# Patient Record
Sex: Male | Born: 1955
Health system: Southern US, Community
[De-identification: ages and names within clinical notes are randomized; demographics above are authoritative.]

## PROBLEM LIST (undated history)

## (undated) DIAGNOSIS — C801 Malignant (primary) neoplasm, unspecified: Secondary | ICD-10-CM

## (undated) DIAGNOSIS — Z85828 Personal history of other malignant neoplasm of skin: Secondary | ICD-10-CM

## (undated) DIAGNOSIS — I10 Essential (primary) hypertension: Secondary | ICD-10-CM

## (undated) DIAGNOSIS — M1990 Unspecified osteoarthritis, unspecified site: Secondary | ICD-10-CM

## (undated) DIAGNOSIS — H269 Unspecified cataract: Secondary | ICD-10-CM

## (undated) DIAGNOSIS — R011 Cardiac murmur, unspecified: Secondary | ICD-10-CM

## (undated) DIAGNOSIS — F419 Anxiety disorder, unspecified: Secondary | ICD-10-CM

## (undated) HISTORY — DX: Unspecified cataract: H26.9

## (undated) HISTORY — PX: KNEE ARTHROSCOPY: SHX127

## (undated) HISTORY — PX: JOINT REPLACEMENT: SHX530

## (undated) HISTORY — PX: SPINE SURGERY: SHX786

---

## 2005-07-04 ENCOUNTER — Ambulatory Visit: Payer: Self-pay | Admitting: Gastroenterology

## 2008-11-12 HISTORY — PX: BACK SURGERY: SHX140

## 2008-11-12 HISTORY — PX: KNEE ARTHROPLASTY: SHX992

## 2008-11-18 ENCOUNTER — Inpatient Hospital Stay (HOSPITAL_COMMUNITY): Admission: RE | Admit: 2008-11-18 | Discharge: 2008-11-20 | Payer: Self-pay | Admitting: Orthopedic Surgery

## 2008-11-18 HISTORY — PX: REPLACEMENT TOTAL KNEE: SUR1224

## 2009-10-25 ENCOUNTER — Inpatient Hospital Stay (HOSPITAL_COMMUNITY): Admission: RE | Admit: 2009-10-25 | Discharge: 2009-10-28 | Payer: Self-pay | Admitting: Neurosurgery

## 2011-02-13 LAB — CBC
HCT: 46 % (ref 39.0–52.0)
Hemoglobin: 16 g/dL (ref 13.0–17.0)
MCHC: 34.8 g/dL (ref 30.0–36.0)
MCV: 94.2 fL (ref 78.0–100.0)
Platelets: 207 10*3/uL (ref 150–400)
RBC: 4.88 MIL/uL (ref 4.22–5.81)
RDW: 13.5 % (ref 11.5–15.5)
WBC: 4.4 10*3/uL (ref 4.0–10.5)

## 2011-02-13 LAB — COMPREHENSIVE METABOLIC PANEL
ALT: 24 U/L (ref 0–53)
AST: 15 U/L (ref 0–37)
Albumin: 4.2 g/dL (ref 3.5–5.2)
Alkaline Phosphatase: 64 U/L (ref 39–117)
BUN: 22 mg/dL (ref 6–23)
CO2: 25 mEq/L (ref 19–32)
Calcium: 9.4 mg/dL (ref 8.4–10.5)
Chloride: 104 mEq/L (ref 96–112)
Creatinine, Ser: 1.13 mg/dL (ref 0.4–1.5)
GFR calc non Af Amer: >60 mL/min (ref >60)
Glucose, Bld: 103 mg/dL — ABNORMAL HIGH (ref 70–99)
Potassium: 4.6 mEq/L (ref 3.5–5.1)
Sodium: 138 mEq/L (ref 135–145)
Total Bilirubin: 0.8 mg/dL (ref 0.3–1.2)
Total Protein: 6.3 g/dL (ref 6.0–8.3)

## 2011-02-13 LAB — ABO/RH: ABO/RH(D): O POS

## 2011-02-13 LAB — DIFFERENTIAL
Basophils Absolute: 0 10*3/uL (ref 0.0–0.1)
Basophils Relative: 1 % (ref 0–1)
Eosinophils Absolute: 0.1 10*3/uL (ref 0.0–0.7)
Eosinophils Relative: 2 % (ref 0–5)
Lymphocytes Relative: 31 % (ref 12–46)
Lymphs Abs: 1.4 10*3/uL (ref 0.7–4.0)
Monocytes Absolute: 0.4 10*3/uL (ref 0.1–1.0)
Monocytes Relative: 10 % (ref 3–12)
Neutro Abs: 2.5 10*3/uL (ref 1.7–7.7)
Neutrophils Relative %: 56 % (ref 43–77)

## 2011-02-13 LAB — URINALYSIS, ROUTINE W REFLEX MICROSCOPIC
Bilirubin Urine: NEGATIVE
Glucose, UA: NEGATIVE mg/dL
Hgb urine dipstick: NEGATIVE
Ketones, ur: NEGATIVE mg/dL
Nitrite: NEGATIVE
Protein, ur: NEGATIVE mg/dL
Specific Gravity, Urine: 1.027 (ref 1.005–1.030)
Urobilinogen, UA: 0.2 mg/dL (ref 0.0–1.0)
pH: 5.5 (ref 5.0–8.0)

## 2011-02-13 LAB — TYPE AND SCREEN
ABO/RH(D): O POS
Antibody Screen: NEGATIVE

## 2011-02-13 LAB — APTT: aPTT: 32 seconds (ref 24–37)

## 2011-02-13 LAB — PROTIME-INR
INR: 0.99 (ref 0.00–1.49)
Prothrombin Time: 13 seconds (ref 11.6–15.2)

## 2011-02-26 LAB — CBC
HCT: 48.5 % (ref 39.0–52.0)
Hemoglobin: 16.6 g/dL (ref 13.0–17.0)
MCHC: 34.2 g/dL (ref 30.0–36.0)
MCV: 95.5 fL (ref 78.0–100.0)
Platelets: 216 10*3/uL (ref 150–400)
RBC: 5.08 MIL/uL (ref 4.22–5.81)
RDW: 13.2 % (ref 11.5–15.5)
WBC: 4.3 10*3/uL (ref 4.0–10.5)

## 2011-02-26 LAB — DIFFERENTIAL
Basophils Absolute: 0 10*3/uL (ref 0.0–0.1)
Basophils Relative: 1 % (ref 0–1)
Eosinophils Absolute: 0.1 10*3/uL (ref 0.0–0.7)
Eosinophils Relative: 3 % (ref 0–5)
Lymphocytes Relative: 32 % (ref 12–46)
Lymphs Abs: 1.4 10*3/uL (ref 0.7–4.0)
Monocytes Absolute: 0.5 10*3/uL (ref 0.1–1.0)
Monocytes Relative: 11 % (ref 3–12)
Neutro Abs: 2.4 10*3/uL (ref 1.7–7.7)
Neutrophils Relative %: 55 % (ref 43–77)

## 2011-02-26 LAB — HEMOGLOBIN AND HEMATOCRIT, BLOOD
HCT: 32.8 % — ABNORMAL LOW (ref 39.0–52.0)
HCT: 33.2 % — ABNORMAL LOW (ref 39.0–52.0)
HCT: 38.7 % — ABNORMAL LOW (ref 39.0–52.0)
Hemoglobin: 11.4 g/dL — ABNORMAL LOW (ref 13.0–17.0)
Hemoglobin: 11.6 g/dL — ABNORMAL LOW (ref 13.0–17.0)
Hemoglobin: 13.4 g/dL (ref 13.0–17.0)

## 2011-02-26 LAB — URINALYSIS, ROUTINE W REFLEX MICROSCOPIC
Bilirubin Urine: NEGATIVE
Glucose, UA: NEGATIVE mg/dL
Hgb urine dipstick: NEGATIVE
Ketones, ur: NEGATIVE mg/dL
Nitrite: NEGATIVE
Protein, ur: NEGATIVE mg/dL
Specific Gravity, Urine: 1.021 (ref 1.005–1.030)
Urobilinogen, UA: 0.2 mg/dL (ref 0.0–1.0)
pH: 5.5 (ref 5.0–8.0)

## 2011-02-26 LAB — TYPE AND SCREEN
ABO/RH(D): O POS
Antibody Screen: NEGATIVE

## 2011-02-26 LAB — PROTIME-INR
INR: 1 (ref 0.00–1.49)
INR: 1.3 (ref 0.00–1.49)
INR: 2.6 — ABNORMAL HIGH (ref 0.00–1.49)
Prothrombin Time: 12.9 seconds (ref 11.6–15.2)
Prothrombin Time: 16.1 seconds — ABNORMAL HIGH (ref 11.6–15.2)
Prothrombin Time: 29.3 seconds — ABNORMAL HIGH (ref 11.6–15.2)

## 2011-02-26 LAB — COMPREHENSIVE METABOLIC PANEL
ALT: 23 U/L (ref 0–53)
AST: 19 U/L (ref 0–37)
Albumin: 4.3 g/dL (ref 3.5–5.2)
Alkaline Phosphatase: 63 U/L (ref 39–117)
BUN: 23 mg/dL (ref 6–23)
CO2: 23 mEq/L (ref 19–32)
Calcium: 9.4 mg/dL (ref 8.4–10.5)
Chloride: 107 mEq/L (ref 96–112)
Creatinine, Ser: 1.01 mg/dL (ref 0.4–1.5)
GFR calc non Af Amer: >60 mL/min (ref >60)
Glucose, Bld: 105 mg/dL — ABNORMAL HIGH (ref 70–99)
Potassium: 4.1 mEq/L (ref 3.5–5.1)
Sodium: 136 mEq/L (ref 135–145)
Total Bilirubin: 0.6 mg/dL (ref 0.3–1.2)
Total Protein: 7 g/dL (ref 6.0–8.3)

## 2011-02-26 LAB — URINE CULTURE
Colony Count: NO GROWTH
Culture: NO GROWTH
Special Requests: NEGATIVE

## 2011-02-26 LAB — ABO/RH: ABO/RH(D): O POS

## 2011-02-26 LAB — APTT: aPTT: 32 seconds (ref 24–37)

## 2011-03-27 NOTE — H&P (Signed)
Matthew [name], Matthew [name]             ACCOUNT NO.:  [id number]   MEDICAL RECORD NO.:  [id number]          PATIENT TYPE:  INP   LOCATION:                               FACILITY:  [facility]   PHYSICIAN:  Georges Lynch. Gioffre, M.D.DATE OF BIRTH:  24-Apr-1956   DATE OF ADMISSION:  11/18/2008  DATE OF DISCHARGE:                              HISTORY & PHYSICAL   DATE OF ADMISSION:  November 18, 2008.   CHIEF COMPLAINT:  Bilateral knee pain left greater than right.   PRESENT ILLNESS:  The patient is a 55 year old gentleman with  longstanding problems with bilateral knees.  He has had his left knee  arthroscoped in the past by Dr. Fannie Knee.  Patient has significant pain with  range of motion and ambulation.  He has lost range of motion.  X-ray  shows he has severe tricompartment arthritic changes of his left knee  with advanced arthritic changes of his right knee.  The patient has  elected to proceed with a left total knee arthroplasty.   CURRENT MEDICATIONS:  Lisinopril once a day.   ALLERGIES:  No known drug allergies.   PRIMARY CARE PHYSICIAN:  Erskine Speed, M.D..   PAST MEDICAL HISTORY:  Includes:  1. Hypertension.  2,  Cardiac murmur.  1. History of hemorrhoids.   REVIEW OF SYSTEMS:  Is negative for any neurologic, pulmonary.  His  blood pressure is fairly well-controlled.  He has never had a heart  attack.  No chest pains, no shortness of breath, irregular heart  rhythms.  GI:  He has had hemorrhoids in the past.  No problems  recently.  No problems with ulcers, gallbladder or liver.  GU:  Is  unremarkable.  ENDOCRINE:  Is unremarkable.   PAST SURGICAL HISTORY:  Includes left knee arthroscopic surgery in 1986  and 1990 without any complications.   FAMILY MEDICAL HISTORY:  Father is deceased from prostate cancer.  Mother is in good health.   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  He does have occasional alcoholic beverage.  He does not have  any  history of street drug use.  He has three grown children.  He lives  alone in a two-level single family home.   PHYSICAL EXAM:  VITALS:  Height is 5 feet 7 inches, weight is 240, blood  pressure is 148/86, pulse of 80 and regular, respirations 12, patient is  afebrile.  GENERAL:  This is a healthy-appearing, slightly centrally obese  gentleman conscious, alert and appropriate who appears to be a good  historian.  He ambulates with a fairly easy balanced gait.  HEENT:  Head was normocephalic.  Pupils equal, round and reactive.  Gross hearing is intact.  NECK:  Was supple.  No palpable lymphadenopathy.  Good range of motion.  CHEST:  Lung sounds were clear and equal bilaterally.  No wheezes,  rales, rhonchi.  HEART:  Regular rate and rhythm.  No murmurs, rubs or gallops.  ABDOMEN:  Was round, soft.  Bowel sounds present.  EXTREMITIES:  Upper extremities had good range of motion of shoulders,  elbows  and wrists.  Motor strength is 5/5.  LOWER EXTREMITIES:  He had good range of motion of both hips today  without any discomfort.  Both knees lacked about 5 degrees of extension.  He can flex both knees back to 120 degrees.  He did have crepitus under  both patellas.  He had no effusions.  He had no instability.  The calves  were soft, good motion at the ankles.  PERIPHERAL VASCULAR:  Carotid pulses were 2+, no bruits.  Radial pulses  were 2+.  Posterior tibial pulses were 1+.  He had no lower extremity  edema.  NEURO:  The patient was conscious, alert and appropriate.  He had no  gross neurologic defects noted.  BREAST, RECTAL AND GU EXAMS:  Were deferred at this time.   IMPRESSION:  1. End-stage tricompartmental arthritic changes left knee.  2. Right knee with advanced arthritic changes.  3. Hypertension.  4. History of cardiac murmur.  5. History of hemorrhoids.   PLAN:  The patient will undergo all routine laboratories and tests prior  to having a left total knee arthroplasty by Dr.  Darrelyn Hillock at [facility] on November 18, 2008.      Jamelle Rushing, P.A.    ______________________________  Georges Lynch Darrelyn Hillock, M.D.    RWK/MEDQ  D:  11/17/2008  T:  11/17/2008  Job:  [id number]   cc:   Windy Fast A. Darrelyn Hillock, M.D.  Fax: [phone]

## 2011-03-27 NOTE — Op Note (Signed)
Matthew [name], Matthew [name]             ACCOUNT NO.:  [id number]   MEDICAL RECORD NO.:  [id number]          PATIENT TYPE:  INP   LOCATION:  NA                           FACILITY:  [facility]   PHYSICIAN:  Georges Lynch. Gioffre, M.D.DATE OF BIRTH:  1956-08-22   DATE OF PROCEDURE:  11/18/2008  DATE OF DISCHARGE:                               OPERATIVE REPORT   SURGEON:  Dr. Darrelyn Hillock.   OPERATIONS:  Arlyn Leak, P.A.   PREOPERATIVE DIAGNOSES:  Severe degenerative arthritis with a flexion  contraction of the left knee.  Note, he was unable to extend his knee  approximately 15-20 degrees preoperatively.   POSTOPERATIVE DIAGNOSIS:  Severe degenerative arthritis with a flexion  contraction of the left knee.  Note, he was unable to extend his knee  approximately 15-20 degrees preoperatively.   OPERATION:  Left total knee arthroplasty utilizing the DePuy system.  I  cemented all 3 components in simultaneously, and vancomycin was used in  the cement.  The sizes used:  The femoral component was a size 5 left  posterior cruciate stabilizing.  The tibial insert was a size 5 10-mm  thickness rotating platform.  The tibial tray was a size 5.  The patella  was a size 41 with 3 pegs.  Note all 3 components were cemented, and  vancomycin was used in the cement.   PROCEDURE:  Under spinal anesthesia, routine orthopedic prepping and  draping of the left lower extremity was carried out.  He had 2 grams of  IV Ancef.  At this time, the leg was exsanguinated with an Esmarch.  Tourniquet was elevated at 375 mmHg.  The knee was flexed.  An anterior  incision was made.  Two flaps were created over the left knee.  I then  carried out a median parapatellar incision.  Note, the knee was  extremely tight because of the marked contracture.  I then flexed the  knee and did medial and lateral meniscectomy, and I excised the anterior  posterior cruciate ligaments.  At this time, initial drill holes were  made in the  intercondylar notch.  Following that, the intramedullary  guide was used.  We initially removed 11-mm thickness off of distal  femur.  We had to go back later and remove another 4-mm thickness off  because a severe contracture of the knee.  We measured in the femur to  be a size 5 left.  We then inserted our next jig and carried out our  anterior, posterior chamfer cuts.  Following that, we prepared the tibia  in the usual fashion.  Drill holes were made in the tibial plateau.  We  then inserted our intramedullary guide, and we removed 4-mm thickness  off the affected medial side of the tibia.  Following that, we cut our  keel cut out of the proximal tibia plateau.  Following that, we then  went ahead and cut our notch cut out of the distal femur.  We thoroughly  water picked out the knee, and we did use the spacer blocks for our  measurements for ligamentous tensions.  We also exposed the  posterior  condyles and removed the spurs off the posterior condyles with final  procedure.  Following that, we went ahead and inserted our temporary  prosthesis.  We then did a resurfacing procedure on the patella in the  usual fashion for a 41-mm patella.  Three drill holes were made in the  patella.  Following that, we removed all trial components, thoroughly  water picked out the knee, cemented all 3 components in simultaneously.  At this particular time, we then after the cement was hardened removed  all loose pieces of cement.  We removed the trial tibial insert,  thoroughly water picked out the knee, looked for cement again.  We  removed all loose pieces of cement.  Following that, we injected 30 mL  of 0.25% Marcaine with epinephrine and 30 mg of Toradol into the soft  tissue structures.  I then water picked the knee out again, dried the  knee out, and inserted my permanent rotating platform, size 5 10-mm  thickness.  At this time, the knee was reduced.  We had  excellent function and good  extension, good flexion, good lateral medial  stability.  Note, the patella was reexamined to make sure there were no  loose pieces of cement in the patella, and there were not.  We then  inserted our Hemovac drain and closed the knee in layers in usual  fashion.  Sterile Neosporin dressings were applied.           ______________________________  Georges Lynch Darrelyn Hillock, M.D.     RAG/MEDQ  D:  11/18/2008  T:  11/18/2008  Job:  [id number]   cc:   Windy Fast A. Darrelyn Hillock, M.D.  Fax: [phone]

## 2011-03-30 NOTE — Discharge Summary (Signed)
[name], [name]             ACCOUNT NO.:  [id number]   MEDICAL RECORD NO.:  [id number]          PATIENT TYPE:  INP   LOCATION:  1608                         [facility]ACILITY:  [facility]   PHYSICIAN:  Georges Lynch. Gioffre, M.D.DATE OF BIRTH:  June 04, 1956   DATE OF ADMISSION:  11/18/2008  DATE OF DISCHARGE:  11/20/2008                               DISCHARGE SUMMARY   ADMISSION DIAGNOSES:  1. Bilateral knee severe arthritis left more symptomatic than right.  2. Hypertension.  3. Cardiac murmur.  4. History of hemorrhoids.   DISCHARGE DIAGNOSES:  1. Left total knee arthroplasty.  2. Severe osteoarthritis right knee.  3. Hypertension.  4. Cardiac murmur.  5. History of hemorrhoids.   HISTORY OF PRESENT ILLNESS:  The patient is a 55 year old gentleman with  longstanding problem of bilateral knees.  The left knee was scoped  several times in the past.  He has severe pain with range of motion,  weightbearing and significant effect on his activities of daily living.  X-ray shows he has severe tricompartmental changes of left knee with  advanced changes of right knee.  The patient has elected to proceed with  a left total knee arthroplasty.   ALLERGIES:  No known drug allergies.   CURRENT MEDICATIONS:  Lisinopril once a day.   SURGICAL PROCEDURE:  On November 18, 2008, the patient was taken to the OR  by Dr. Worthy Rancher and assisted by Oneida Alar PA-C and under general  anesthesia the patient underwent a left total knee arthroplasty with a  DePuy rotating platform system.  The patient tolerated the procedure  well.  There was minimal blood loss.  No complications.  The patient had  the following components implanted.  A size 5 femoral component left  side, a size 5 keel to left tibial tray, a size 41 mm three peg patella,  a size 5 and 10 mL polyethylene bearing.  All bones were implanted with  methylmethacrylate with vancomycin mixed in.   CONSULTS:  Following routine consult requested  physical therapy, case  management, pharmacy for Coumadin monitoring.   HOSPITAL COURSE:  On November 18, 2008, the patient was admitted to [facility] under the care of Dr. Worthy Rancher.  The patient was taken  to the OR where a left total knee arthroplasty was performed without any  complications.  The patient was transferred to the recovery room and  then to the orthopedic floor in good condition with IV antibiotics, pain  medicines and Coumadin for DVT prophylaxis.  He was to follow a total  knee protocol.  The patient was able to wean off his IV pain medicines  well without any difficulties.  The patient just had some continued  drainage from his Hemovac port so his IV antibiotics were continued for  an extra 24 hours.  The patient's pain was well-controlled with p.o.  management.  The patient's vital signs remained stable with activities.  The patient tolerated the physical therapy well.  He was able to  ambulate in excess of 200 feet.  The patient's wound remained benign for  any signs of infection.  His leg remained neuromotor vascularly intact.  The patient progressed nicely with therapy.  On postop day 2 the patient  was eager to go home.  Arrangements were made and he was discharged in  good condition for outpatient home health with physical therapy  arrangements.   LABS:  CBC on admission found WBCs 4.3, hemoglobin 16.6, hematocrit  48.5, platelets 216.  On discharge his H and H was 11.4 with a  hematocrit of 32.8.  His INR was 2.6.  Chemistries were within normal  limits.  Urinalysis on admission was normal.  EKG on admission was  normal sinus rhythm at 78 beats.  Postop the left knee film shows left  knee arthroplasty without apparent complications.   DISCHARGE INSTRUCTIONS:  Diet, no restrictions.  Activity, may increase  activities slowly with use of a walker.  Wound care, he is to change his  dressing daily.   FOLLOWUP:  1. He is to have a followup  appointment with Dr. Darrelyn Hillock 2 weeks from      discharge.  He is to call [phone] for that followup appointment.  2. Home health arrangements for physical therapy and Coumadin      monitoring.   MEDICATIONS:  1. Percocet 10/650 one every 4-6 hours for pain if needed.  2. Coumadin.  He is to hold it on Saturday, January 9 and then restart      it on Saturday and follow instructions through home health physical      therapy's agency pharmacist.  3. Robaxin 500 mg once every 6 hours if needed for spasms.  4. Lisinopril 20 mg once a day.  5. Aleve.  He is to stop while he is on Coumadin.   DISCHARGE CONDITION:  The patient's condition upon discharge home is  improved and good.      Jamelle Rushing, P.A.    ______________________________  Georges Lynch Darrelyn Hillock, M.D.    RWK/MEDQ  D:  12/08/2008  T:  12/08/2008  Job:  [id number]

## 2011-09-26 ENCOUNTER — Encounter (HOSPITAL_COMMUNITY): Payer: Self-pay | Admitting: Pharmacy Technician

## 2011-09-27 ENCOUNTER — Encounter (HOSPITAL_COMMUNITY): Payer: Self-pay

## 2011-09-27 ENCOUNTER — Other Ambulatory Visit: Payer: Self-pay

## 2011-09-27 ENCOUNTER — Ambulatory Visit (HOSPITAL_COMMUNITY)
Admission: RE | Admit: 2011-09-27 | Discharge: 2011-09-27 | Disposition: A | Discharge status: Home / Self Care | Payer: BC Managed Care – PPO | Source: Ambulatory Visit | Attending: Orthopedic Surgery | Admitting: Orthopedic Surgery

## 2011-09-27 ENCOUNTER — Encounter (HOSPITAL_COMMUNITY)
Admission: RE | Admit: 2011-09-27 | Discharge: 2011-09-27 | Disposition: A | Discharge status: Home / Self Care | Payer: BC Managed Care – PPO | Source: Ambulatory Visit | Attending: Orthopedic Surgery | Admitting: Orthopedic Surgery

## 2011-09-27 ENCOUNTER — Other Ambulatory Visit: Payer: Self-pay | Admitting: Orthopedic Surgery

## 2011-09-27 DIAGNOSIS — Z01812 Encounter for preprocedural laboratory examination: Secondary | ICD-10-CM | POA: Insufficient documentation

## 2011-09-27 DIAGNOSIS — Z01818 Encounter for other preprocedural examination: Secondary | ICD-10-CM | POA: Insufficient documentation

## 2011-09-27 DIAGNOSIS — Z01810 Encounter for preprocedural cardiovascular examination: Secondary | ICD-10-CM | POA: Insufficient documentation

## 2011-09-27 HISTORY — DX: Unspecified osteoarthritis, unspecified site: M19.90

## 2011-09-27 HISTORY — DX: Essential (primary) hypertension: I10

## 2011-09-27 LAB — CBC
HCT: 48.3 % (ref 39.0–52.0)
Hemoglobin: 16.9 g/dL (ref 13.0–17.0)
MCH: 32.8 pg (ref 26.0–34.0)
MCHC: 35 g/dL (ref 30.0–36.0)
MCV: 93.6 fL (ref 78.0–100.0)
Platelets: 205 10*3/uL (ref 150–400)
RBC: 5.16 MIL/uL (ref 4.22–5.81)
RDW: 12.8 % (ref 11.5–15.5)
WBC: 4.5 10*3/uL (ref 4.0–10.5)

## 2011-09-27 LAB — SURGICAL PCR SCREEN
MRSA, PCR: NEGATIVE
Staphylococcus aureus: NEGATIVE

## 2011-09-27 LAB — URINALYSIS, ROUTINE W REFLEX MICROSCOPIC
Bilirubin Urine: NEGATIVE
Glucose, UA: NEGATIVE mg/dL
Hgb urine dipstick: NEGATIVE
Ketones, ur: NEGATIVE mg/dL
Leukocytes, UA: NEGATIVE
Nitrite: NEGATIVE
Protein, ur: NEGATIVE mg/dL
Specific Gravity, Urine: 1.021 (ref 1.005–1.030)
Urobilinogen, UA: 1 mg/dL (ref 0.0–1.0)
pH: 7 (ref 5.0–8.0)

## 2011-09-27 LAB — TYPE AND SCREEN
ABO/RH(D): O POS
Antibody Screen: NEGATIVE

## 2011-09-27 LAB — DIFFERENTIAL
Basophils Absolute: 0 10*3/uL (ref 0.0–0.1)
Basophils Relative: 1 % (ref 0–1)
Eosinophils Absolute: 0.1 10*3/uL (ref 0.0–0.7)
Eosinophils Relative: 2 % (ref 0–5)
Lymphocytes Relative: 36 % (ref 12–46)
Lymphs Abs: 1.6 10*3/uL (ref 0.7–4.0)
Monocytes Absolute: 0.5 10*3/uL (ref 0.1–1.0)
Monocytes Relative: 12 % (ref 3–12)
Neutro Abs: 2.3 10*3/uL (ref 1.7–7.7)
Neutrophils Relative %: 50 % (ref 43–77)

## 2011-09-27 LAB — COMPREHENSIVE METABOLIC PANEL
ALT: 15 U/L (ref 0–53)
AST: 16 U/L (ref 0–37)
Albumin: 4.6 g/dL (ref 3.5–5.2)
Alkaline Phosphatase: 64 U/L (ref 39–117)
BUN: 20 mg/dL (ref 6–23)
CO2: 25 mEq/L (ref 19–32)
Calcium: 10.1 mg/dL (ref 8.4–10.5)
Chloride: 101 mEq/L (ref 96–112)
Creatinine, Ser: 0.85 mg/dL (ref 0.50–1.35)
GFR calc Af Amer: >90 mL/min (ref >90)
GFR calc non Af Amer: >90 mL/min (ref >90)
Glucose, Bld: 97 mg/dL (ref 70–99)
Potassium: 4.4 mEq/L (ref 3.5–5.1)
Sodium: 137 mEq/L (ref 135–145)
Total Bilirubin: 0.4 mg/dL (ref 0.3–1.2)
Total Protein: 7.6 g/dL (ref 6.0–8.3)

## 2011-09-27 LAB — APTT: aPTT: 34 seconds (ref 24–37)

## 2011-09-27 LAB — PROTIME-INR
INR: 1.06 (ref 0.00–1.49)
Prothrombin Time: 14 seconds (ref 11.6–15.2)

## 2011-09-27 MED ORDER — CHLORHEXIDINE GLUCONATE 4 % EX LIQD: 60.0000 mL | Freq: Once | CUTANEOUS | Status: DC

## 2011-09-27 NOTE — Pre-Procedure Instructions (Signed)
[address] Demasi Kinsel  09/27/2011   Your procedure is scheduled on: NOV 26 Report to [facility] Short Stay [facility] at 0530 AM.  Call this number if you have problems the morning of surgery: [phone]   Remember:   Do not eat food:After Midnight.  Do not drink clear liquids: 4 Hours before arrival.  Take these medicines the morning of surgery with A SIP OF WATER: NONE   Do not wear jewelry, make-up or nail polish.  Do not wear lotions, powders, or perfumes. You may wear deodorant.  Do not shave 48 hours prior to surgery.  Do not bring valuables to the hospital.  Contacts, dentures or bridgework may not be worn into surgery.  Leave suitcase in the car. After surgery it may be brought to your room.  For patients admitted to the hospital, checkout time is 11:00 AM the day of discharge.   Patients discharged the day of surgery will not be allowed to drive home.  Name and phone number of your driver: FAMILY Special Instructions: Incentive Spirometry - Practice and bring it with you on the day of surgery. and CHG Shower Use Special Wash: 1/2 bottle night before surgery and 1/2 bottle morning of surgery.   Please read over the following fact sheets that you were given: Pain Booklet, Blood Transfusion Information, Total Joint Packet, MRSA Information and Surgical Site Infection Prevention

## 2011-09-28 LAB — URINE CULTURE
Colony Count: NO GROWTH
Culture  Setup Time: 201211151214
Culture: NO GROWTH

## 2011-10-01 ENCOUNTER — Other Ambulatory Visit: Payer: Self-pay | Admitting: Orthopedic Surgery

## 2011-10-01 NOTE — H&P (Signed)
  Matthew [name] MRN:  [id number] DOB/SEX:  July 16, 1956/male  CHIEF COMPLAINT:  Painful right Knee  HISTORY: Patient is a 55 y.o. male presented with a history of pain in the right knee. Onset of symptoms was gradual starting several years ago with gradually worsening course since that time. The patient noted no past surgery on the right knee. Prior procedures on the knee include arthroscopy. Patient has been treated conservatively with over-the-counter NSAIDs and activity modification. Patient currently rates pain in the knee at 9 out of 10 with activity. There is pain at night.  PAST MEDICAL HISTORY: There are no active problems to display for this patient.  Past Medical History  Diagnosis Date  . Hypertension     FOLLOWED BY DR ED GREEN  . Arthritis     KNEES BACK SHOULDERS   Past Surgical History  Procedure Date  . Knee arthroscopy     L X 2  . Knee arthroplasty 2010    L  . Back surgery 2010    L5S1 FUSION     MEDICATIONS:   (Not in a hospital admission)  ALLERGIES:  No Known Allergies  REVIEW OF SYSTEMS:  Pertinent items are noted in HPI.   FAMILY HISTORY:  No family history on file.  SOCIAL HISTORY:   History  Substance Use Topics  . Smoking status: Never Smoker   . Smokeless tobacco: Not on file  . Alcohol Use: 0.6 oz/week    1 Cans of beer per week     EXAMINATION:  Vital signs in last 24 hours: @VSRANGES @  General appearance: alert, cooperative and no distress Head: Normocephalic, without obvious abnormality, atraumatic Neck: no adenopathy, no carotid bruit, no JVD, supple, symmetrical, trachea midline and thyroid not enlarged, symmetric, no tenderness/mass/nodules Lungs: clear to auscultation bilaterally Heart: regular rate and rhythm, S1, S2 normal, no murmur, click, rub or gallop Extremities: extremities normal, atraumatic, no cyanosis or edema and Homans sign is negative, no sign of DVT Pulses: 2+ and symmetric Skin: Skin color, texture,  turgor normal. No rashes or lesions  Musculoskeletal:  ROM 0-90, Ligaments intact, positive crepitus Imaging Review Plain radiographs demonstrate severe degenerative joint disease of the right knee. The overall alignment is mild varus. The bone quality appears to be good for age and reported activity level.  Assessment/Plan: End stage arthritis, right knee   The patient history, physical examination and imaging studies are consistent with advanced degenerative joint disease of the right knee. The patient has failed conservative treatment.  The clearance notes were reviewed.  After discussion with the patient it was felt that Total Knee Replacement was indicated. The procedure,  risks, and benefits of total knee arthroplasty were presented and reviewed. The risks including but not limited to aseptic loosening, infection, blood clots, vascular injury, stiffness, patella tracking problems complications among others were discussed. The patient acknowledged the explanation, agreed to proceed with the plan.  Matthew [name] 10/01/2011, 11:48 AM

## 2011-10-07 MED ORDER — SODIUM CHLORIDE 0.9 % IV SOLN: INTRAVENOUS | Status: DC

## 2011-10-07 MED ORDER — CEFAZOLIN SODIUM-DEXTROSE 2-3 GM-% IV SOLR
2.0000 g | INTRAVENOUS | Status: AC
  Administered 2011-10-08: 2 g via INTRAVENOUS
  Filled 2011-10-07: qty 50

## 2011-10-07 MED ORDER — ACETAMINOPHEN 10 MG/ML IV SOLN: 1000.0000 mg | Freq: Once | INTRAVENOUS | Status: DC

## 2011-10-08 ENCOUNTER — Encounter (HOSPITAL_COMMUNITY): Payer: Self-pay

## 2011-10-08 ENCOUNTER — Encounter (HOSPITAL_COMMUNITY)
Admission: RE | Disposition: A | Discharge status: Home Health | Payer: Self-pay | Source: Ambulatory Visit | Attending: Orthopedic Surgery

## 2011-10-08 ENCOUNTER — Other Ambulatory Visit: Payer: Self-pay | Admitting: Orthopedic Surgery

## 2011-10-08 ENCOUNTER — Encounter (HOSPITAL_COMMUNITY): Payer: Self-pay | Admitting: Anesthesiology

## 2011-10-08 ENCOUNTER — Inpatient Hospital Stay (HOSPITAL_COMMUNITY)
Admission: RE | Admit: 2011-10-08 | Discharge: 2011-10-10 | DRG: 209 | Disposition: A | Discharge status: Home Health | Payer: BC Managed Care – PPO | Source: Ambulatory Visit | Attending: Orthopedic Surgery | Admitting: Orthopedic Surgery

## 2011-10-08 ENCOUNTER — Inpatient Hospital Stay (HOSPITAL_COMMUNITY): Payer: BC Managed Care – PPO | Admitting: Anesthesiology

## 2011-10-08 DIAGNOSIS — Z981 Arthrodesis status: Secondary | ICD-10-CM

## 2011-10-08 DIAGNOSIS — M129 Arthropathy, unspecified: Secondary | ICD-10-CM | POA: Diagnosis present

## 2011-10-08 DIAGNOSIS — M1710 Unilateral primary osteoarthritis, unspecified knee: Principal | ICD-10-CM | POA: Diagnosis present

## 2011-10-08 DIAGNOSIS — M1711 Unilateral primary osteoarthritis, right knee: Secondary | ICD-10-CM

## 2011-10-08 DIAGNOSIS — I10 Essential (primary) hypertension: Secondary | ICD-10-CM | POA: Diagnosis present

## 2011-10-08 DIAGNOSIS — Z79899 Other long term (current) drug therapy: Secondary | ICD-10-CM

## 2011-10-08 HISTORY — PX: TOTAL KNEE ARTHROPLASTY: SHX125

## 2011-10-08 SURGERY — ARTHROPLASTY, KNEE, TOTAL
Anesthesia: Regional | Site: Knee | Laterality: Right | Wound class: Clean

## 2011-10-08 MED ORDER — OXYCODONE HCL 20 MG PO TB12
20.0000 mg | ORAL_TABLET | Freq: Two times a day (BID) | ORAL | Status: DC
  Administered 2011-10-08 – 2011-10-10 (×5): 20 mg via ORAL
  Filled 2011-10-08 (×5): qty 1

## 2011-10-08 MED ORDER — NEOSTIGMINE METHYLSULFATE 1 MG/ML IJ SOLN
INTRAMUSCULAR | Status: DC | PRN
  Administered 2011-10-08: 3 mg via INTRAVENOUS

## 2011-10-08 MED ORDER — ACETAMINOPHEN 325 MG PO TABS: 650.0000 mg | ORAL_TABLET | Freq: Four times a day (QID) | ORAL | Status: DC | PRN

## 2011-10-08 MED ORDER — MIDAZOLAM HCL 5 MG/5ML IJ SOLN
INTRAMUSCULAR | Status: DC | PRN
  Administered 2011-10-08: 2 mg via INTRAVENOUS

## 2011-10-08 MED ORDER — ALUM & MAG HYDROXIDE-SIMETH 200-200-20 MG/5ML PO SUSP
30.0000 mL | ORAL | Status: DC | PRN
  Administered 2011-10-09: 30 mL via ORAL
  Filled 2011-10-08: qty 30

## 2011-10-08 MED ORDER — BUPIVACAINE 0.25 % ON-Q PUMP SINGLE CATH 300ML
300.0000 mL | INJECTION | Status: DC
  Filled 2011-10-08: qty 300

## 2011-10-08 MED ORDER — ONDANSETRON HCL 4 MG PO TABS: 4.0000 mg | ORAL_TABLET | Freq: Four times a day (QID) | ORAL | Status: DC | PRN

## 2011-10-08 MED ORDER — SODIUM CHLORIDE 0.9 % IV SOLN
INTRAVENOUS | Status: DC
  Administered 2011-10-08 – 2011-10-09 (×3): via INTRAVENOUS

## 2011-10-08 MED ORDER — METOCLOPRAMIDE HCL 5 MG/ML IJ SOLN
5.0000 mg | Freq: Three times a day (TID) | INTRAMUSCULAR | Status: DC | PRN
  Filled 2011-10-08: qty 2

## 2011-10-08 MED ORDER — BUPIVACAINE-EPINEPHRINE 0.25% -1:200000 IJ SOLN
INTRAMUSCULAR | Status: DC | PRN
  Administered 2011-10-08: 20 mL

## 2011-10-08 MED ORDER — METHOCARBAMOL 100 MG/ML IJ SOLN
500.0000 mg | INTRAVENOUS | Status: AC
  Filled 2011-10-08: qty 5

## 2011-10-08 MED ORDER — POLYETHYLENE GLYCOL 3350 17 G PO PACK
17.0000 g | PACK | Freq: Every day | ORAL | Status: DC | PRN
  Filled 2011-10-08: qty 1

## 2011-10-08 MED ORDER — BISACODYL 10 MG RE SUPP: 10.0000 mg | Freq: Every day | RECTAL | Status: DC | PRN

## 2011-10-08 MED ORDER — METHOCARBAMOL 500 MG PO TABS
500.0000 mg | ORAL_TABLET | Freq: Four times a day (QID) | ORAL | Status: DC | PRN
  Administered 2011-10-08 – 2011-10-10 (×3): 500 mg via ORAL
  Filled 2011-10-08 (×3): qty 1

## 2011-10-08 MED ORDER — BISACODYL 5 MG PO TBEC: 10.0000 mg | DELAYED_RELEASE_TABLET | Freq: Every day | ORAL | Status: DC | PRN

## 2011-10-08 MED ORDER — MAGNESIUM HYDROXIDE 400 MG/5ML PO SUSP: 30.0000 mL | Freq: Two times a day (BID) | ORAL | Status: DC | PRN

## 2011-10-08 MED ORDER — PHENYLEPHRINE HCL 10 MG/ML IJ SOLN
INTRAMUSCULAR | Status: DC | PRN
  Administered 2011-10-08: 80 ug via INTRAVENOUS
  Administered 2011-10-08: 40 ug via INTRAVENOUS
  Administered 2011-10-08: 80 ug via INTRAVENOUS

## 2011-10-08 MED ORDER — CEFAZOLIN SODIUM-DEXTROSE 2-3 GM-% IV SOLR
2.0000 g | Freq: Four times a day (QID) | INTRAVENOUS | Status: AC
  Administered 2011-10-08 – 2011-10-09 (×2): 2 g via INTRAVENOUS
  Filled 2011-10-08 (×2): qty 50

## 2011-10-08 MED ORDER — KETOROLAC TROMETHAMINE 30 MG/ML IJ SOLN
INTRAMUSCULAR | Status: DC | PRN
  Administered 2011-10-08: 30 mg via INTRAVENOUS

## 2011-10-08 MED ORDER — FENTANYL CITRATE 0.05 MG/ML IJ SOLN
INTRAMUSCULAR | Status: DC | PRN
  Administered 2011-10-08 (×3): 50 ug via INTRAVENOUS
  Administered 2011-10-08: 100 ug via INTRAVENOUS

## 2011-10-08 MED ORDER — CEFAZOLIN SODIUM-DEXTROSE 2-3 GM-% IV SOLR
2.0000 g | Freq: Four times a day (QID) | INTRAVENOUS | Status: DC
  Administered 2011-10-08: 2 g via INTRAVENOUS
  Filled 2011-10-08 (×4): qty 50

## 2011-10-08 MED ORDER — GLYCOPYRROLATE 0.2 MG/ML IJ SOLN
INTRAMUSCULAR | Status: DC | PRN
  Administered 2011-10-08: .4 mg via INTRAVENOUS

## 2011-10-08 MED ORDER — ACETAMINOPHEN 650 MG RE SUPP: 650.0000 mg | Freq: Four times a day (QID) | RECTAL | Status: DC | PRN

## 2011-10-08 MED ORDER — LIDOCAINE HCL (CARDIAC) 20 MG/ML IV SOLN
INTRAVENOUS | Status: DC | PRN
  Administered 2011-10-08: 60 mg via INTRAVENOUS

## 2011-10-08 MED ORDER — BUPIVACAINE-EPINEPHRINE PF 0.5-1:200000 % IJ SOLN
INTRAMUSCULAR | Status: DC | PRN
  Administered 2011-10-08: 30 mL

## 2011-10-08 MED ORDER — ROCURONIUM BROMIDE 100 MG/10ML IV SOLN
INTRAVENOUS | Status: DC | PRN
  Administered 2011-10-08: 50 mg via INTRAVENOUS

## 2011-10-08 MED ORDER — MEPERIDINE HCL 25 MG/ML IJ SOLN: 6.2500 mg | INTRAMUSCULAR | Status: DC | PRN

## 2011-10-08 MED ORDER — FLEET ENEMA 7-19 GM/118ML RE ENEM: 1.0000 | ENEMA | Freq: Every day | RECTAL | Status: DC | PRN

## 2011-10-08 MED ORDER — OXYCODONE HCL 5 MG PO TABS
5.0000 mg | ORAL_TABLET | ORAL | Status: DC | PRN
Start: 2011-10-08 — End: 2011-10-10
  Administered 2011-10-08 – 2011-10-10 (×11): 10 mg via ORAL
  Filled 2011-10-08 (×11): qty 2

## 2011-10-08 MED ORDER — BUPIVACAINE ON-Q PAIN PUMP (FOR ORDER SET NO CHG)
INJECTION | Status: DC
Start: 2011-10-08 — End: 2011-10-10
  Filled 2011-10-08: qty 1

## 2011-10-08 MED ORDER — ONDANSETRON HCL 4 MG/2ML IJ SOLN
4.0000 mg | Freq: Four times a day (QID) | INTRAMUSCULAR | Status: DC | PRN
  Filled 2011-10-08: qty 2

## 2011-10-08 MED ORDER — PROPOFOL 10 MG/ML IV EMUL
INTRAVENOUS | Status: DC | PRN
  Administered 2011-10-08: 150 mg via INTRAVENOUS

## 2011-10-08 MED ORDER — DIPHENHYDRAMINE HCL 12.5 MG/5ML PO ELIX
12.5000 mg | ORAL_SOLUTION | ORAL | Status: DC | PRN
  Filled 2011-10-08: qty 10

## 2011-10-08 MED ORDER — KETOROLAC TROMETHAMINE 30 MG/ML IJ SOLN: 15.0000 mg | Freq: Once | INTRAMUSCULAR | Status: DC | PRN

## 2011-10-08 MED ORDER — SODIUM CHLORIDE 0.9 % IR SOLN
Status: DC | PRN
  Administered 2011-10-08: 1000 mL

## 2011-10-08 MED ORDER — MENTHOL 3 MG MT LOZG: 1.0000 | LOZENGE | OROMUCOSAL | Status: DC | PRN

## 2011-10-08 MED ORDER — DOCUSATE SODIUM 100 MG PO CAPS
100.0000 mg | ORAL_CAPSULE | Freq: Two times a day (BID) | ORAL | Status: DC
  Administered 2011-10-09 – 2011-10-10 (×4): 100 mg via ORAL
  Filled 2011-10-08 (×5): qty 1

## 2011-10-08 MED ORDER — LACTATED RINGERS IV SOLN: INTRAVENOUS | Status: DC | PRN

## 2011-10-08 MED ORDER — PHENOL 1.4 % MT LIQD
1.0000 | OROMUCOSAL | Status: DC | PRN
  Filled 2011-10-08: qty 177

## 2011-10-08 MED ORDER — ENOXAPARIN SODIUM 30 MG/0.3ML ~~LOC~~ SOLN
30.0000 mg | Freq: Two times a day (BID) | SUBCUTANEOUS | Status: DC
  Administered 2011-10-09 – 2011-10-10 (×3): 30 mg via SUBCUTANEOUS
  Filled 2011-10-08 (×5): qty 0.3

## 2011-10-08 MED ORDER — HYDROMORPHONE HCL PF 1 MG/ML IJ SOLN
0.2500 mg | INTRAMUSCULAR | Status: DC | PRN
  Administered 2011-10-08 (×2): 0.5 mg via INTRAVENOUS

## 2011-10-08 MED ORDER — HYDROMORPHONE HCL PF 1 MG/ML IJ SOLN
0.5000 mg | INTRAMUSCULAR | Status: DC | PRN
Start: 2011-10-08 — End: 2011-10-10
  Administered 2011-10-09 (×3): 1 mg via INTRAVENOUS
  Filled 2011-10-08 (×3): qty 1

## 2011-10-08 MED ORDER — LACTATED RINGERS IV SOLN
INTRAVENOUS | Status: DC | PRN
  Administered 2011-10-08 (×2): via INTRAVENOUS

## 2011-10-08 MED ORDER — METHOCARBAMOL 100 MG/ML IJ SOLN
500.0000 mg | Freq: Four times a day (QID) | INTRAVENOUS | Status: DC | PRN
  Administered 2011-10-08: 500 mg via INTRAVENOUS
  Filled 2011-10-08: qty 5

## 2011-10-08 MED ORDER — LISINOPRIL 20 MG PO TABS
20.0000 mg | ORAL_TABLET | Freq: Every day | ORAL | Status: DC
  Administered 2011-10-08 – 2011-10-09 (×2): 20 mg via ORAL
  Filled 2011-10-08 (×3): qty 1

## 2011-10-08 MED ORDER — ONDANSETRON HCL 4 MG/2ML IJ SOLN
INTRAMUSCULAR | Status: DC | PRN
  Administered 2011-10-08: 4 mg via INTRAVENOUS

## 2011-10-08 MED ORDER — ZOLPIDEM TARTRATE 5 MG PO TABS: 5.0000 mg | ORAL_TABLET | Freq: Every evening | ORAL | Status: DC | PRN

## 2011-10-08 MED ORDER — METOCLOPRAMIDE HCL 10 MG PO TABS: 5.0000 mg | ORAL_TABLET | Freq: Three times a day (TID) | ORAL | Status: DC | PRN

## 2011-10-08 SURGICAL SUPPLY — 59 items
BANDAGE ESMARK 6X9 LF (GAUZE/BANDAGES/DRESSINGS) ×1 IMPLANT
BLADE SAGITTAL 13X1.27X60 (BLADE) ×2 IMPLANT
BLADE SAW SGTL 83.5X18.5 (BLADE) ×2 IMPLANT
BNDG ESMARK 6X9 LF (GAUZE/BANDAGES/DRESSINGS) ×2
BOWL SMART MIX CTS (DISPOSABLE) ×2 IMPLANT
CATH KIT ON Q 10IN SLV (PAIN MANAGEMENT) ×2 IMPLANT
CEMENT BONE SIMPLEX SPEEDSET (Cement) ×2 IMPLANT
CLOTH BEACON ORANGE TIMEOUT ST (SAFETY) ×2 IMPLANT
COVER BACK TABLE 24X17X13 BIG (DRAPES) ×2 IMPLANT
COVER SURGICAL LIGHT HANDLE (MISCELLANEOUS) ×2 IMPLANT
CUFF TOURNIQUET SINGLE 34IN LL (TOURNIQUET CUFF) ×2 IMPLANT
DRAPE EXTREMITY T 121X128X90 (DRAPE) ×2 IMPLANT
DRAPE INCISE IOBAN 66X45 STRL (DRAPES) ×4 IMPLANT
DRAPE PROXIMA HALF (DRAPES) ×2 IMPLANT
DRAPE U-SHAPE 47X51 STRL (DRAPES) ×2 IMPLANT
DRSG ADAPTIC 3X8 NADH LF (GAUZE/BANDAGES/DRESSINGS) ×2 IMPLANT
DRSG PAD ABDOMINAL 8X10 ST (GAUZE/BANDAGES/DRESSINGS) ×2 IMPLANT
DURAPREP 26ML APPLICATOR (WOUND CARE) ×4 IMPLANT
ELECT REM PT RETURN 9FT ADLT (ELECTROSURGICAL) ×2
ELECTRODE REM PT RTRN 9FT ADLT (ELECTROSURGICAL) ×1 IMPLANT
EVACUATOR 1/8 PVC DRAIN (DRAIN) ×2 IMPLANT
GLOVE BIOGEL M 7.0 STRL (GLOVE) IMPLANT
GLOVE BIOGEL PI IND STRL 7.5 (GLOVE) IMPLANT
GLOVE BIOGEL PI IND STRL 8.5 (GLOVE) ×2 IMPLANT
GLOVE BIOGEL PI INDICATOR 7.5 (GLOVE)
GLOVE BIOGEL PI INDICATOR 8.5 (GLOVE) ×2
GLOVE SURG ORTHO 8.0 STRL STRW (GLOVE) ×6 IMPLANT
GOWN PREVENTION PLUS XLARGE (GOWN DISPOSABLE) ×6 IMPLANT
GOWN STRL NON-REIN LRG LVL3 (GOWN DISPOSABLE) ×2 IMPLANT
HANDPIECE INTERPULSE COAX TIP (DISPOSABLE) ×1
HOOD PEEL AWAY FACE SHEILD DIS (HOOD) ×8 IMPLANT
KIT BASIN OR (CUSTOM PROCEDURE TRAY) ×2 IMPLANT
KIT ROOM TURNOVER OR (KITS) ×2 IMPLANT
MANIFOLD NEPTUNE II (INSTRUMENTS) ×2 IMPLANT
NEEDLE 22X1 1/2 (OR ONLY) (NEEDLE) ×2 IMPLANT
NS IRRIG 1000ML POUR BTL (IV SOLUTION) ×2 IMPLANT
PACK TOTAL JOINT (CUSTOM PROCEDURE TRAY) ×2 IMPLANT
PAD ARMBOARD 7.5X6 YLW CONV (MISCELLANEOUS) ×2 IMPLANT
PAD CAST 4YDX4 CTTN HI CHSV (CAST SUPPLIES) ×1 IMPLANT
PADDING CAST COTTON 4X4 STRL (CAST SUPPLIES) ×1
PADDING CAST COTTON 6X4 STRL (CAST SUPPLIES) ×2 IMPLANT
PAIN PUMP ON-Q 400MLX5ML 5IN (MISCELLANEOUS) ×2 IMPLANT
POSITIONER HEAD PRONE TRACH (MISCELLANEOUS) ×2 IMPLANT
SET HNDPC FAN SPRY TIP SCT (DISPOSABLE) ×1 IMPLANT
SPONGE GAUZE 4X4 12PLY (GAUZE/BANDAGES/DRESSINGS) ×2 IMPLANT
SPONGE GAUZE 4X4 STERILE 39 (GAUZE/BANDAGES/DRESSINGS) ×2 IMPLANT
STAPLER VISISTAT 35W (STAPLE) ×2 IMPLANT
SUCTION FRAZIER TIP 10 FR DISP (SUCTIONS) ×2 IMPLANT
SUT BONE WAX W31G (SUTURE) ×2 IMPLANT
SUT VIC AB 0 CTB1 27 (SUTURE) ×4 IMPLANT
SUT VIC AB 1 CT1 27 (SUTURE) ×4
SUT VIC AB 1 CT1 27XBRD ANBCTR (SUTURE) ×4 IMPLANT
SUT VIC AB 2-0 CT1 27 (SUTURE) ×2
SUT VIC AB 2-0 CT1 TAPERPNT 27 (SUTURE) ×2 IMPLANT
SYR CONTROL 10ML LL (SYRINGE) ×2 IMPLANT
TOWEL OR 17X24 6PK STRL BLUE (TOWEL DISPOSABLE) ×2 IMPLANT
TOWEL OR 17X26 10 PK STRL BLUE (TOWEL DISPOSABLE) ×2 IMPLANT
TRAY FOLEY CATH 14FR (SET/KITS/TRAYS/PACK) IMPLANT
WATER STERILE IRR 1000ML POUR (IV SOLUTION) ×6 IMPLANT

## 2011-10-08 NOTE — Interval H&P Note (Signed)
History and Physical Interval Note:   10/08/2011   7:40 AM   Matthew [name]  has presented today for surgery, with the diagnosis of OSTEOARTHRITIS RIGHT KNEE  The various methods of treatment have been discussed with the patient and family. After consideration of risks, benefits and other options for treatment, the patient has consented to  Procedure(s): TOTAL KNEE ARTHROPLASTY as a surgical intervention .  The patients' history has been reviewed, patient examined, no change in status, stable for surgery.  I have reviewed the patients' chart and labs.  Questions were answered to the patient's satisfaction.     Raymon Mutton  MD

## 2011-10-08 NOTE — Anesthesia Postprocedure Evaluation (Signed)
  Anesthesia Post-op Note  Patient: Matthew [name]  Procedure(s) Performed:  TOTAL KNEE ARTHROPLASTY - TOTAL RIGHT KNEE ARTHROPLASTY   Patient Location: PACU  Anesthesia Type: GA combined with regional for post-op pain  Level of Consciousness: awake  Airway and Oxygen Therapy: Patient Spontanous Breathing  Post-op Pain: mild  Post-op Assessment: Post-op Vital signs reviewed  Post-op Vital Signs: stable  Complications: No apparent anesthesia complications

## 2011-10-08 NOTE — Preoperative (Signed)
Beta Blockers   Reason not to administer Beta Blockers:Not Applicable 

## 2011-10-08 NOTE — Plan of Care (Signed)
Problem: Consults Goal: Diagnosis- Total Joint Replacement Primary Total Knee     

## 2011-10-08 NOTE — Anesthesia Procedure Notes (Signed)
Anesthesia Regional Block:  Femoral nerve block  Pre-Anesthetic Checklist: ,, timeout performed, Correct Patient, Correct Site, Correct Laterality, Correct Procedure, Correct Position, site marked, Risks and benefits discussed, at surgeon's request and post-op pain management   Prep: Betadine       Needles:  Injection technique: Single-shot  Needle Type: Stimulator Needle - 80      Needle Gauge: 22 and 22 G  Needle insertion depth: 6 cm   Additional Needles:  Procedures: nerve stimulator Femoral nerve block  Nerve Stimulator or Paresthesia:  Response: Twitch elicited, 0.8 mA, 4 cm  Additional Responses:   Narrative:  Start time: 10/08/2011 7:10 AM End time: 10/08/2011 7:29 AM  Performed by: Personally   Additional Notes: BP cuff, EKG monitors applied. Sedation begun. Femoral artery palpated for location of nerve. After nerve location anesthetic injected incrementally, slowly , and after neg aspirations. Tolerated well.  Femoral nerve block

## 2011-10-08 NOTE — Transfer of Care (Signed)
Immediate Anesthesia Transfer of Care Note  Patient: Matthew [name]  Procedure(s) Performed:  TOTAL KNEE ARTHROPLASTY - TOTAL RIGHT KNEE ARTHROPLASTY   Patient Location: PACU  Anesthesia Type: General  Level of Consciousness: awake, alert  and oriented  Airway & Oxygen Therapy: Patient Spontanous Breathing and Patient connected to nasal cannula oxygen  Post-op Assessment: Report given to PACU RN and Post -op Vital signs reviewed and stable  Post vital signs: Reviewed and stable  Complications: No apparent anesthesia complications

## 2011-10-08 NOTE — H&P (View-Only) (Signed)
  Matthew [name] MRN:  [id number] DOB/SEX:  28-Jan-1956/male  CHIEF COMPLAINT:  Painful right Knee  HISTORY: Patient is a 55 y.o. male presented with a history of pain in the right knee. Onset of symptoms was gradual starting several years ago with gradually worsening course since that time. The patient noted no past surgery on the right knee. Patient has been treated conservatively with over-the-counter NSAIDs and activity modification. Patient currently rates pain in the knee at 9 out of 10 with activity. There is pain at night.  PAST MEDICAL HISTORY: There are no active problems to display for this patient.  Past Medical History  Diagnosis Date  . Hypertension     FOLLOWED BY DR ED GREEN  . Arthritis     KNEES BACK SHOULDERS   Past Surgical History  Procedure Date  . Knee arthroscopy     L X 2  . Knee arthroplasty 2010    L  . Back surgery 2010    L5S1 FUSION     MEDICATIONS:   (Not in a hospital admission)  ALLERGIES:  No Known Allergies  REVIEW OF SYSTEMS:  Pertinent items are noted in HPI.   FAMILY HISTORY:  No family history on file.  SOCIAL HISTORY:   History  Substance Use Topics  . Smoking status: Never Smoker   . Smokeless tobacco: Not on file  . Alcohol Use: 0.6 oz/week    1 Cans of beer per week     EXAMINATION:  Vital signs in last 24 hours: @VSRANGES @  General appearance: alert and cooperative Head: Normocephalic, without obvious abnormality, atraumatic Eyes: conjunctivae/corneas clear. PERRL, EOM's intact. Fundi benign. Lungs: clear to auscultation bilaterally Heart: regular rate and rhythm, S1, S2 normal, no murmur, click, rub or gallop Abdomen: soft, non-tender; bowel sounds normal; no masses,  no organomegaly Extremities: extremities normal, atraumatic, no cyanosis or edema and Homans sign is negative, no sign of DVT Pulses: 2+ and symmetric Skin: Skin color, texture, turgor normal. No rashes or lesions  Musculoskeletal:  ROM 0-100,  Ligaments intact,  Imaging Review Plain radiographs demonstrate severe degenerative joint disease of the right knee. The overall alignment is mild valgus. The bone quality appears to be good for age and reported activity level.  Assessment/Plan: End stage arthritis, right knee   The patient history, physical examination and imaging studies are consistent with advanced degenerative joint disease of the right knee. The patient has failed conservative treatment.  The clearance notes were reviewed.  After discussion with the patient it was felt that Total Knee Replacement was indicated. The procedure,  risks, and benefits of total knee arthroplasty were presented and reviewed. The risks including but not limited to aseptic loosening, infection, blood clots, vascular injury, stiffness, patella tracking problems complications among others were discussed. The patient acknowledged the explanation, agreed to proceed with the plan.  Matthew [name] 10/08/2011, 7:06 AM

## 2011-10-08 NOTE — Anesthesia Preprocedure Evaluation (Addendum)
Anesthesia Evaluation  Patient identified by MRN, date of birth, ID band Patient awake    Reviewed: Allergy & Precautions, H&P , NPO status , Patient's Chart, lab work & pertinent test results  History of Anesthesia Complications Negative for: history of anesthetic complications  Airway Mallampati: I  Neck ROM: Full    Dental  (+) Teeth Intact   Pulmonary neg pulmonary ROS,  clear to auscultation        Cardiovascular hypertension, Regular Normal    Neuro/Psych Negative Neurological ROS     GI/Hepatic negative GI ROS, Neg liver ROS,   Endo/Other    Renal/GU negative Renal ROS     Musculoskeletal   Abdominal (+) obese,   Peds  Hematology   Anesthesia Other Findings   Reproductive/Obstetrics                          Anesthesia Physical Anesthesia Plan  ASA: II  Anesthesia Plan: General   Post-op Pain Management:    Induction: Intravenous  Airway Management Planned: Oral ETT  Additional Equipment:   Intra-op Plan:   Post-operative Plan: Extubation in OR  Informed Consent: I have reviewed the patients History and Physical, chart, labs and discussed the procedure including the risks, benefits and alternatives for the proposed anesthesia with the patient or authorized representative who has indicated his/her understanding and acceptance.   Dental advisory given  Plan Discussed with: CRNA and Surgeon  Anesthesia Plan Comments:         Anesthesia Quick Evaluation

## 2011-10-08 NOTE — Op Note (Signed)
TOTAL KNEE REPLACEMENT OPERATIVE NOTE:  10/08/2011  1:00 PM  PATIENT:  Matthew [name]  55 y.o. male  PRE-OPERATIVE DIAGNOSIS:  OSTEOARTHRITIS RIGHT KNEE  POST-OPERATIVE DIAGNOSIS:  OSTEOARTHRITIS RIGHT KNEE  PROCEDURE:  Procedure(s): TOTAL KNEE ARTHROPLASTY  SURGEON:  Surgeon(s): Raymon Mutton, MD  PHYSICIAN ASSISTANT: Altamese Cabal, [facility]  ANESTHESIA:   general  DRAINS: Hemovac and On-Q Marcaine Pain Pump  SPECIMEN: None  COUNTS:  Correct  TOURNIQUET:   Total Tourniquet Time Documented: Thigh (Right) - 72 minutes  DICTATION:  Indication for procedure:    The patient is a 55 y.o. male who has failed conservative treatment for OSTEOARTHRITIS RIGHT KNEE.  Informed consent was obtained prior to anesthesia. The risks versus benefits of the operation were explain and in a way the patient can, and did, understand.   Description of procedure:     The patient was taken to the operating room and placed under anesthesia.  The patient was positioned in the usual fashion taking care that all body parts were adequately padded and/or protected.  I foley catheter was not placed.  A tourniquet was applied and the leg prepped and draped in the usual sterile fashion.  The extremity was exsanguinated with the esmarch and tourniquet inflated to 350 mmHg.  Pre-operative range of motion was normal.  The knee was in 3 degree of mild varus.  A midline incision approximately 6-7 inches long was made with a #10 blade.  A new blade was used to make a parapatellar arthrotomy going 2-3 cm into the quadriceps tendon, over the patella, and alongside the medial aspect of the patellar tendon.  A synovectomy was then performed with the #10 blade and forceps. I then elevated the deep MCL off the medial tibial metaphysis subperiosteally around to the semimembranosus attachment.    I everted the patella and used calipers to measure patellar thickness, which was52mm.  I used the reamer to ream down to  appropriate thickness to recreated the native thickness.  I then removed excess bone with the rongeur and sagittal saw.  I used the 35 mm template and drilled the three lug holes.  I then put the trial in place and measured the thickness with the calipers to ensure recreation of the native thickness.  The trial was then removed and the patella subluxed and the knee brought into flexion.  A homan retractor was place to retract and protect the patella and lateral structures.  A Z-retractor was place medially to protect the medial structures.  The extra-medullary alignment system was used to make cut the tibial articular surface perpendicular to the anamotic axis of the tibia and in 3 degrees of posterior slope.  The cut surface and alignment jig was removed.  I then used the intramedullary alignment guide to make a 3 valgus cut on the distal femur.  I then marked out the epicondylar axis on the distal femur.  The posterior condylar axis measured 3 degrees.  I then used the anterior referencing sizer and measured the femur to be a size F.  The 4-In-1 cutting block was screwed into place in external rotation matching the posterior condylar angle, making our cuts perpendicular to the epicondylar axis.  Anterior, posterior and chamfer cuts were made with the sagittal saw.  The cutting block and cut pieces were removed.  A lamina spreader was placed in 90 degrees of flexion.  The ACL, PCL, menisci, and posterior condylar osteophytes were removed.  A 10 mm spacer blocked was found to  offer good flexion and extension gap balance after severe in degree releasing.   The scoop retractor was then placed and the femoral finishing block was pinned in place.  The small sagittal saw was used as well as the lug drill to finish the femur.  The block and cut surfaces were removed and the medullary canal hole filled with autograft bone from the cut pieces.  The tibia was delivered forward in deep flexion and external rotation.   A size 7 tray was selected and pinned into place centered on the medial 1/3 of the tibial tubercle.  The reamer and keel was used to prepare the tibia through the tray.    I then trialed with the size F femur, size 7 tibia, a 10 mm insert and the 35 patella.  I had excellent flexion/extension gap balance, excellent patella tracking.  Flexion was full and beyond 120 degrees; extension was zero.  These components were chosen and the staff opened them to me on the back table while the knee was lavaged copiously and the cement mixed.  I cemented in the components and removed all excess cement.  The polyethylene tibial component was snapped into place and the knee placed in extension while cement was hardening.  The capsule was infilltrated with 20cc of .25% Marcaine with epinephrine.  A hemovac was place in the joint exiting superolaterally.  A pain pump was place superomedially superficial to the arthrotomy.  Once the cement was hard, the tourniquet was let down.  Hemostasis was obtained.  The arthrotomy was closed with figure-8 #1 vicryl sutures.  The deep soft tissues were closed with #0 vicryls and the subcuticular layer closed with a running #2-0 vicryl.  The skin was reapproximated and closed with skin staples.  The wound was dressed with xeroform, 4 x4's, 2 ABD sponges, a single layer of webril and a TED stocking.   The patient was then awakened, extubated, and taken to the recovery room in stable condition.  BLOOD LOSS:  300cc DRAINS: 1 hemovac, 1 pain catheter COMPLICATIONS:  None.  PLAN OF CARE: Admit to inpatient   PATIENT DISPOSITION:  PACU - hemodynamically stable.   Delay start of Pharmacological VTE agent (>24hrs) due to surgical blood loss or risk of bleeding:  no

## 2011-10-08 NOTE — Progress Notes (Signed)
Reviewed order for case management. Waiting on PT/OT evaluation for [facility] determination.

## 2011-10-08 NOTE — H&P (Signed)
  Matthew [name] MRN:  [id number] DOB/SEX:  09/24/1956/male  CHIEF COMPLAINT:  Painful right Knee  HISTORY: Patient is a 55 y.o. male presented with a history of pain in the right knee. Onset of symptoms was gradual starting several years ago with gradually worsening course since that time. The patient noted no past surgery on the right knee. Patient has been treated conservatively with over-the-counter NSAIDs and activity modification. Patient currently rates pain in the knee at 9 out of 10 with activity. There is pain at night.  PAST MEDICAL HISTORY: There are no active problems to display for this patient.  Past Medical History  Diagnosis Date  . Hypertension     FOLLOWED BY DR ED GREEN  . Arthritis     KNEES BACK SHOULDERS   Past Surgical History  Procedure Date  . Knee arthroscopy     L X 2  . Knee arthroplasty 2010    L  . Back surgery 2010    L5S1 FUSION     MEDICATIONS:   (Not in a hospital admission)  ALLERGIES:  No Known Allergies  REVIEW OF SYSTEMS:  Pertinent items are noted in HPI.   FAMILY HISTORY:  No family history on file.  SOCIAL HISTORY:   History  Substance Use Topics  . Smoking status: Never Smoker   . Smokeless tobacco: Not on file  . Alcohol Use: 0.6 oz/week    1 Cans of beer per week     EXAMINATION:  Vital signs in last 24 hours: @VSRANGES@  General appearance: alert and cooperative Head: Normocephalic, without obvious abnormality, atraumatic Eyes: conjunctivae/corneas clear. PERRL, EOM's intact. Fundi benign. Lungs: clear to auscultation bilaterally Heart: regular rate and rhythm, S1, S2 normal, no murmur, click, rub or gallop Abdomen: soft, non-tender; bowel sounds normal; no masses,  no organomegaly Extremities: extremities normal, atraumatic, no cyanosis or edema and Homans sign is negative, no sign of DVT Pulses: 2+ and symmetric Skin: Skin color, texture, turgor normal. No rashes or lesions  Musculoskeletal:  ROM 0-100,  Ligaments intact,  Imaging Review Plain radiographs demonstrate severe degenerative joint disease of the right knee. The overall alignment is mild valgus. The bone quality appears to be good for age and reported activity level.  Assessment/Plan: End stage arthritis, right knee   The patient history, physical examination and imaging studies are consistent with advanced degenerative joint disease of the right knee. The patient has failed conservative treatment.  The clearance notes were reviewed.  After discussion with the patient it was felt that Total Knee Replacement was indicated. The procedure,  risks, and benefits of total knee arthroplasty were presented and reviewed. The risks including but not limited to aseptic loosening, infection, blood clots, vascular injury, stiffness, patella tracking problems complications among others were discussed. The patient acknowledged the explanation, agreed to proceed with the plan.  Matthew [name] 10/08/2011, 7:06 AM   

## 2011-10-09 ENCOUNTER — Encounter (HOSPITAL_COMMUNITY): Payer: Self-pay | Admitting: Orthopedic Surgery

## 2011-10-09 LAB — CBC
HCT: 34.5 % — ABNORMAL LOW (ref 39.0–52.0)
Hemoglobin: 11.5 g/dL — ABNORMAL LOW (ref 13.0–17.0)
MCH: 31.5 pg (ref 26.0–34.0)
MCHC: 33.3 g/dL (ref 30.0–36.0)
MCV: 94.5 fL (ref 78.0–100.0)
Platelets: 172 10*3/uL (ref 150–400)
RBC: 3.65 MIL/uL — ABNORMAL LOW (ref 4.22–5.81)
RDW: 12.9 % (ref 11.5–15.5)
WBC: 6.6 10*3/uL (ref 4.0–10.5)

## 2011-10-09 LAB — BASIC METABOLIC PANEL
BUN: 22 mg/dL (ref 6–23)
CO2: 24 mEq/L (ref 19–32)
Calcium: 7.9 mg/dL — ABNORMAL LOW (ref 8.4–10.5)
Chloride: 101 mEq/L (ref 96–112)
Creatinine, Ser: 1.12 mg/dL (ref 0.50–1.35)
GFR calc Af Amer: 83 mL/min — ABNORMAL LOW (ref >90)
GFR calc non Af Amer: 72 mL/min — ABNORMAL LOW (ref >90)
Glucose, Bld: 112 mg/dL — ABNORMAL HIGH (ref 70–99)
Potassium: 4.2 mEq/L (ref 3.5–5.1)
Sodium: 133 mEq/L — ABNORMAL LOW (ref 135–145)

## 2011-10-09 MED ORDER — ACETAMINOPHEN 10 MG/ML IV SOLN
1000.0000 mg | Freq: Four times a day (QID) | INTRAVENOUS | Status: AC
  Administered 2011-10-09 – 2011-10-10 (×4): 1000 mg via INTRAVENOUS
  Filled 2011-10-09 (×4): qty 100

## 2011-10-09 MED ORDER — CELECOXIB 200 MG PO CAPS
200.0000 mg | ORAL_CAPSULE | Freq: Two times a day (BID) | ORAL | Status: DC
  Administered 2011-10-09 – 2011-10-10 (×3): 200 mg via ORAL
  Filled 2011-10-09 (×5): qty 1

## 2011-10-09 NOTE — Progress Notes (Signed)
PATIENT ID:      GEMINI BEAUMIER  MRN:     [id number] DOB/AGE:    Sep 02, 1956 / 55 y.o.    PROGRESS NOTE Subjective:  negative for Chest Pain  negative for Shortness of Breath  negative for Nausea/Vomiting   negative for Calf Pain  negative for Bowel Movement   Tolerating Diet: yes         Patient reports pain as 8 on 0-10 scale.    Objective: Vital signs in last 24 hours:  Patient Vitals for the past 24 hrs:  BP Temp Temp src Pulse Resp SpO2  10/09/11 0615 97/65 mmHg 98.7 F (37.1 C) - 85  20  100 %  10/08/11 2109 100/66 mmHg 98 F (36.7 C) - 92  18  95 %  10/08/11 1130 131/86 mmHg 97.1 F (36.2 C) Oral 71  16  97 %  10/08/11 1109 - - - 68  9  99 %  10/08/11 1108 - - - 73  16  99 %  10/08/11 1107 - - - 75  13  99 %  10/08/11 1106 - - - 71  15  99 %  10/08/11 1105 - - - 69  14  99 %  10/08/11 1104 127/78 mmHg - - 77  12  99 %  10/08/11 1103 - - - 67  12  99 %  10/08/11 1102 - - - 69  17  99 %  10/08/11 1101 - - - 71  14  99 %  10/08/11 1100 - - - 71  13  99 %  10/08/11 1059 - - - 74  15  99 %      Intake/Output from previous day:   11/26 0701 - 11/27 0700 In: 2800 [I.V.:2700] Out: 1450 [Urine:550; Drains:850]   Intake/Output this shift:       Intake/Output      11/26 0701 - 11/27 0700 11/27 0701 - 11/28 0700   I.V. 2700    IV Piggyback 100    Total Intake 2800    Urine 550    Drains 850    Blood 50    Total Output 1450    Net +1350            LABORATORY DATA:  Basename 10/09/11 0608  WBC 6.6  HGB 11.5*  HCT 34.5*  PLT 172    Basename 10/09/11 0608  NA 133*  K 4.2  CL 101  CO2 24  BUN 22  CREATININE 1.12  GLUCOSE 112*  CALCIUM 7.9*   Lab Results  Component Value Date   INR 1.06 09/27/2011   INR 0.99 10/20/2009   INR 2.6* 11/20/2008    Examination:  General appearance: alert, cooperative and no distress Extremities: extremities normal, atraumatic, no cyanosis or edema and Homans sign is negative, no sign of DVT  Wound Exam: clean, dry,  intact   Drainage:  None: wound tissue dry  Motor Exam EHL and FHL Intact  Sensory Exam Tibial normal  Assessment:    1 Day Post-Op  Procedure(s) (LRB): TOTAL KNEE ARTHROPLASTY (Right)  ADDITIONAL DIAGNOSIS:  Active Problems:  * No active hospital problems. *   Acute Blood Loss Anemia   Plan: Physical Therapy as ordered Weight Bearing as Tolerated (WBAT)  DVT Prophylaxis:  Lovenox  DISCHARGE PLAN: Home  DISCHARGE NEEDS: HHPT, CPM, Walker and 3-in-1 comode seat         Britanee Vanblarcom 10/09/2011, 8:24 AM

## 2011-10-09 NOTE — Progress Notes (Signed)
  Occupational Therapy Evaluation Patient Details Name: JACEN CARLINI MRN: [id number] DOB: Aug 31, 1956 Today's Date: 10/09/2011  Problem List: There is no problem list on file for this patient.   Past Medical History:  Past Medical History  Diagnosis Date  . Hypertension     FOLLOWED BY DR ED GREEN  . Arthritis     KNEES BACK SHOULDERS   Past Surgical History:  Past Surgical History  Procedure Date  . Knee arthroscopy     L X 2  . Knee arthroplasty 2010    L  . Back surgery 2010    L5S1 FUSION    OT Assessment/Plan/Recommendation  PT currently Mod I and with No acute OT needs. OT to sign off. Spoke directly to patient and pt able to recall dressing operated leg first, has AE at home from previous surgery, and educated on shower transfer with non operated leg stepping into the shower. Pt agrees no OT needs at this time. Pt up to bathroom on PT (Megan) arrival Mod I.  PT(Megan) reports no acute needs.    OT Evaluation Precautions/Restrictions  Restrictions Weight Bearing Restrictions: No Prior Functioning Independent  SIGN OFF   Harrel Carina [facility] 10/09/2011, 9:30 AM  Pager: [phone]

## 2011-10-09 NOTE — Progress Notes (Signed)
Physical Therapy Evaluation Patient Details Name: Matthew [name] MRN: [id number] DOB: 29-Oct-1956 Today's Date: 10/09/2011  Problem List: There is no problem list on file for this patient.   Past Medical History:  Past Medical History  Diagnosis Date  . Hypertension     FOLLOWED BY DR ED GREEN  . Arthritis     KNEES BACK SHOULDERS   Past Surgical History:  Past Surgical History  Procedure Date  . Knee arthroscopy     L X 2  . Knee arthroplasty 2010    L  . Back surgery 2010    L5S1 FUSION    PT Assessment/Plan/Recommendation PT Assessment Clinical Impression Statement: pt very motivated to improve overall mobility and ROM of TKA to Max I.   PT Recommendation/Assessment: Patient will need skilled PT in the acute care venue PT Problem List: Decreased strength;Decreased range of motion;Decreased activity tolerance;Decreased balance;Decreased mobility;Decreased knowledge of use of DME;Pain Barriers to Discharge: None PT Therapy Diagnosis : Abnormality of gait;Acute pain PT Plan PT Frequency: 7X/week PT Treatment/Interventions: DME instruction;Gait training;Stair training;Functional mobility training;Therapeutic exercise;Balance training;Patient/family education PT Recommendation Follow Up Recommendations: Home health PT Equipment Recommended: None recommended by PT PT Goals  Acute Rehab PT Goals PT Goal Formulation: With patient Time For Goal Achievement: 7 days Pt will go Supine/Side to Sit: Independently PT Goal: Supine/Side to Sit - Progress: Progressing toward goal Pt will go Sit to Supine/Side: Independently PT Goal: Sit to Supine/Side - Progress: Progressing toward goal Pt will Transfer Sit to Stand/Stand to Sit: with modified independence;with upper extremity assist PT Transfer Goal: Sit to Stand/Stand to Sit - Progress: Progressing toward goal Pt will Ambulate: >150 feet;with modified independence;with rolling walker PT Goal: Ambulate - Progress: Progressing  toward goal Pt will Go Up / Down Stairs: Flight;with min assist;with rolling walker PT Goal: Up/Down Stairs - Progress: Not met Pt will Perform Home Exercise Program: Independently PT Goal: Perform Home Exercise Program - Progress: Progressing toward goal  PT Evaluation Precautions/Restrictions  Precautions Precautions: Fall Restrictions Weight Bearing Restrictions: Yes RLE Weight Bearing: Weight bearing as tolerated Prior Functioning  Home Living Lives With:  (Mother) Receives Help From: Family Type of Home: House Home Layout: Multi-level;Able to live on main level with bedroom/bathroom Alternate Level Stairs-Rails:  (one side) Alternate Level Stairs-Number of Steps: pt lives in split level and has 12 steps to get to main level.   Home Access: Stairs to enter Entrance Stairs-Rails:  (no rails on 3 steps from driveway, rail on 2 steps to porch) Secretary/administrator of Steps: 77from driveway then 2 more to [employer] Shower/Tub: Health visitor: Standard Prior Function Level of Independence: Independent with basic ADLs;Independent with homemaking with ambulation;Independent with gait;Independent with transfers Able to Take Stairs?: Reciprically Driving: Yes Cognition Cognition Orientation Level: Oriented X4 Sensation/Coordination   Extremity Assessment RLE Assessment RLE Assessment: Exceptions to [facility] RLE AROM (degrees) RLE Overall AROM Comments: AAROM -10-80 RLE Strength RLE Overall Strength Comments: Strength appoaching >3/5 LLE Assessment LLE Assessment: Within Functional Limits Mobility (including Balance) Bed Mobility Bed Mobility: Yes Sit to Supine - Left: 5: Supervision;HOB flat Sit to Supine - Left Details (indicate cue type and reason): demos good technique Transfers Transfers: Yes Sit to Stand: 4: Min assist;From toilet (with grab bars) Sit to Stand Details (indicate cue type and reason): cues for use of grab bar and positioning of  LEs Stand to Sit: 5: Supervision;To bed;With upper extremity assist Stand to Sit Details: cues for use of UEs and positioning  of R LE Ambulation/Gait Ambulation/Gait: Yes Ambulation/Gait Assistance: 4: Min assist Ambulation/Gait Assistance Details (indicate cue type and reason): cues for sequencing, upright posture Ambulation Distance (Feet): 130 Feet Assistive device: Rolling walker Gait Pattern: Step-to pattern;Trunk flexed Stairs: No    Exercise    End of Session PT - End of Session Equipment Utilized During Treatment: Gait belt Activity Tolerance: Patient tolerated treatment well Patient left: in bed;in CPM;with call bell in reach Nurse Communication: Mobility status for ambulation (CPM) General Behavior During Session: [facility] for tasks performed Cognition: [facility] for tasks performed  [name], [place] [phone] 10/09/2011, 10:57 AM

## 2011-10-09 NOTE — Progress Notes (Signed)
Physical Therapy Treatment Patient Details Name: Matthew [name] MRN: [id number] DOB: [birth year] Today's Date: 10/09/2011  PT Assessment/Plan  PT - Assessment/Plan Comments on Treatment Session: pt very motivated and should be ready for stairs tomorrow.   PT Plan: Discharge plan remains appropriate PT Frequency: 7X/week Follow Up Recommendations: Home health PT Equipment Recommended: None recommended by PT PT Goals  Acute Rehab PT Goals PT Goal: Supine/Side to Sit - Progress: Progressing toward goal PT Goal: Sit to Supine/Side - Progress: Progressing toward goal PT Transfer Goal: Sit to Stand/Stand to Sit - Progress: Progressing toward goal PT Goal: Ambulate - Progress: Progressing toward goal PT Goal: Up/Down Stairs - Progress: Not met PT Goal: Perform Home Exercise Program - Progress: Progressing toward goal  PT Treatment Precautions/Restrictions  Precautions Precautions: Fall Restrictions Weight Bearing Restrictions: Yes RLE Weight Bearing: Weight bearing as tolerated Mobility (including Balance) Bed Mobility Bed Mobility: Yes Supine to Sit: 5: Supervision Supine to Sit Details (indicate cue type and reason): cues for encouragement Sit to Supine - Left: 5: Supervision Transfers Transfers: Yes Sit to Stand: 5: Supervision;With upper extremity assist;From bed;From toilet Stand to Sit: 5: Supervision;With upper extremity assist;To bed;To chair/3-in-1 Ambulation/Gait Ambulation/Gait: Yes Ambulation/Gait Assistance: 4: Min assist Ambulation/Gait Assistance Details (indicate cue type and reason): cues for positioning in RW Ambulation Distance (Feet): 200 Feet Assistive device: Rolling walker Gait Pattern: Step-through pattern Stairs: No    Exercise    End of Session PT - End of Session Equipment Utilized During Treatment: Gait belt Activity Tolerance: Patient tolerated treatment well Patient left: in bed;in CPM;with call bell in reach General Behavior During  Session: [facility] for tasks performed Cognition: [facility] for tasks performed  Matthew [name], [place] [phone] 10/09/2011, 4:09 PM

## 2011-10-10 LAB — CBC
HCT: 29.5 % — ABNORMAL LOW (ref 39.0–52.0)
Hemoglobin: 10.3 g/dL — ABNORMAL LOW (ref 13.0–17.0)
MCH: 32.4 pg (ref 26.0–34.0)
MCHC: 34.9 g/dL (ref 30.0–36.0)
MCV: 92.8 fL (ref 78.0–100.0)
Platelets: 143 10*3/uL — ABNORMAL LOW (ref 150–400)
RBC: 3.18 MIL/uL — ABNORMAL LOW (ref 4.22–5.81)
RDW: 12.5 % (ref 11.5–15.5)
WBC: 6.4 10*3/uL (ref 4.0–10.5)

## 2011-10-10 MED ORDER — METHOCARBAMOL 500 MG PO TABS: 500.0000 mg | ORAL_TABLET | Freq: Four times a day (QID) | ORAL | Status: AC | PRN

## 2011-10-10 MED ORDER — ENOXAPARIN SODIUM 30 MG/0.3ML ~~LOC~~ SOLN: 40.0000 mg | SUBCUTANEOUS | Status: DC

## 2011-10-10 MED ORDER — OXYCODONE HCL 20 MG PO TB12: 20.0000 mg | ORAL_TABLET | Freq: Two times a day (BID) | ORAL | Status: AC

## 2011-10-10 MED ORDER — CELECOXIB 200 MG PO CAPS: 200.0000 mg | ORAL_CAPSULE | Freq: Two times a day (BID) | ORAL | Status: AC

## 2011-10-10 MED ORDER — OXYCODONE HCL 5 MG PO TABS: 5.0000 mg | ORAL_TABLET | ORAL | Status: AC | PRN

## 2011-10-10 NOTE — Discharge Summary (Signed)
PATIENT ID:      Matthew [name]  MRN:     [id number] DOB/AGE:    1956/06/23 / 55 y.o.     DISCHARGE SUMMARY  ADMISSION DATE:    10/08/2011 DISCHARGE DATE:   10/10/2011   ADMISSION DIAGNOSIS: OA RT KNEE OSTEOARTHRITIS RIGHT KNEE  (OSTEOARTHRITIS RIGHT KNEE)  DISCHARGE DIAGNOSIS:  OSTEOARTHRITIS RIGHT KNEE    ADDITIONAL DIAGNOSIS: Active Problems:  * No active hospital problems. *   Past Medical History  Diagnosis Date  . Hypertension     FOLLOWED BY DR ED GREEN  . Arthritis     KNEES BACK SHOULDERS    PROCEDURE: Procedure(s): TOTAL KNEE ARTHROPLASTY on 10/08/2011  CONSULTS:     HISTORY:  See H&P in chart  HOSPITAL COURSE:  Matthew [name] is a 55 y.o. admitted on 10/08/2011 and found to have a diagnosis of OSTEOARTHRITIS RIGHT KNEE.  After appropriate laboratory studies were obtained  they were taken to the operating room on 10/08/2011 and underwent Procedure(s): TOTAL KNEE ARTHROPLASTY.   They were given perioperative antibiotics:  Anti-infectives     Start     Dose/Rate Route Frequency Ordered Stop   10/08/11 2000   ceFAZolin (ANCEF) IVPB 2 g/50 mL premix        2 g 100 mL/hr over 30 Minutes Intravenous 4 times per day 10/08/11 1851 10/14/2011 0159   10/08/11 1145   ceFAZolin (ANCEF) IVPB 2 g/50 mL premix  Status:  Discontinued        2 g 100 mL/hr over 30 Minutes Intravenous Every 6 hours 10/08/11 1132 10/08/11 1849   10/08/11 0000   ceFAZolin (ANCEF) IVPB 2 g/50 mL premix        2 g 100 mL/hr over 30 Minutes Intravenous 60 min pre-op 10/07/11 1239 10/08/11 0745        . Blood products given:none   The remainder of the hospital course was dedicated to ambulation and strengthening.   The patient was discharged on 2 Days Post-Op in  Good condition.   DIAGNOSTIC STUDIES: Recent vital signs: Patient Vitals for the past 24 hrs:  BP Temp Temp src Pulse Resp SpO2  10/10/11 0500 134/82 mmHg 98.1 F (36.7 C) Oral 110  18  97 %  14-Oct-2011 2059 107/67 mmHg 98.5  F (36.9 C) Oral 101  18  97 %  Oct 14, 2011 1300 151/77 mmHg 98.8 F (37.1 C) - 96  18  97 %  10/14/2011 0955 117/69 mmHg - - - - -       Recent laboratory studies:  Basename 10/10/11 0530 Oct 14, 2011 0608  WBC 6.4 6.6  HGB 10.3* 11.5*  HCT 29.5* 34.5*  PLT 143* 172    Basename 10/14/11 0608  NA 133*  K 4.2  CL 101  CO2 24  BUN 22  CREATININE 1.12  GLUCOSE 112*  CALCIUM 7.9*   Lab Results  Component Value Date   INR 1.06 09/27/2011   INR 0.99 10/20/2009   INR 2.6* 11/20/2008     Recent Radiographic Studies :   Chest 2 View  09/27/2011  *RADIOLOGY REPORT*  Clinical Data: Preop  CHEST - 2 VIEW  Comparison: 10/20/2009  Findings: Cardiomediastinal silhouette is stable.  No acute infiltrate or pleural effusion.  No pulmonary edema.  Mild hyperinflation again noted.  Stable degenerative changes thoracic spine.  IMPRESSION: No active disease.  No significant change.  Original Report Authenticated By: Natasha Mead, M.D.    DISCHARGE INSTRUCTIONS: Discharge Orders    Future  Orders Please Complete By Expires   Diet - low sodium heart healthy      Call MD / Call 911      Comments:   If you experience chest pain or shortness of breath, CALL 911 and be transported to the hospital emergency room.  If you develope a fever above 101 F, pus (white drainage) or increased drainage or redness at the wound, or calf pain, call your surgeon's office.   Constipation Prevention      Comments:   Drink plenty of fluids.  Prune juice may be helpful.  You may use a stool softener, such as Colace (over the counter) 100 mg twice a day.  Use MiraLax (over the counter) for constipation as needed.   Increase activity slowly as tolerated      Weight Bearing as taught in Physical Therapy      Comments:   Use a walker or crutches as instructed.   Patient may shower      Comments:   You may shower without a dressing once there is no drainage.  Do not wash over the wound.  If drainage remains, cover wound with  plastic wrap and then shower.   Bed to Chair Transfer      Driving restrictions      Comments:   No driving for 6 weeks   Lifting restrictions      Comments:   No lifting for 6 weeks   CPM      Comments:   Continuous passive motion machine (CPM):      Use the CPM from 0 to 90 for 6-8 hours per day.      You may increase by 10 per day.  You may break it up into 2 or 3 sessions per day.      Use CPM for 2 weeks or until you are told to stop.   TED hose      Comments:   Use stockings (TED hose) for 3 weeks on both leg(s).  You may remove them at night for sleeping.   Change dressing      Comments:   Change dressing on thursday, then change the dressing daily with sterile 4 x 4 inch gauze dressing and apply TED hose.   Do not put a pillow under the knee. Place it under the heel.         DISCHARGE MEDICATIONS:  Current Discharge Medication List    START taking these medications   Details  celecoxib (CELEBREX) 200 MG capsule Take 1 capsule (200 mg total) by mouth 2 (two) times daily with a meal. Qty: 30 capsule, Refills: 0    enoxaparin (LOVENOX) 30 MG/0.3ML SOLN Inject 0.4 mLs (40 mg total) into the skin daily. Qty: 10 Syringe, Refills: 0    methocarbamol (ROBAXIN) 500 MG tablet Take 1 tablet (500 mg total) by mouth every 6 (six) hours as needed. Qty: 60 tablet, Refills: 0    oxyCODONE (OXY IR/ROXICODONE) 5 MG immediate release tablet Take 1-2 tablets (5-10 mg total) by mouth every 4 (four) hours as needed. Qty: 60 tablet, Refills: 0    oxyCODONE (OXYCONTIN) 20 MG 12 hr tablet Take 1 tablet (20 mg total) by mouth every 12 (twelve) hours. Qty: 30 tablet, Refills: 0      CONTINUE these medications which have NOT CHANGED   Details  acetaminophen (TYLENOL) 650 MG CR tablet Take 1,300 mg by mouth every 8 (eight) hours as needed. For pain     lisinopril (  PRINIVIL,ZESTRIL) 20 MG tablet Take 20 mg by mouth daily.      OVER THE COUNTER MEDICATION Take 2 tablets by mouth daily.  Raspberry keytone         FOLLOW UP VISIT:   Follow-up Information    Follow up with Raymon Mutton, MD. Call on 10/23/2011.   Contact information:   [address] [place] [address] [phone]          DISPOSITION:  Home    CONDITION:  Good   Matthew [name] 10/10/2011, 6:59 AM

## 2011-10-10 NOTE — Progress Notes (Signed)
Physical Therapy Treatment Patient Details Name: Matthew [name] MRN: [id number] DOB: 12-Feb-1956 Today's Date: 10/10/2011  PT Assessment/Plan  PT - Assessment/Plan Comments on Treatment Session: pt doing great!  Ready for D/C to home.   PT Plan: Discharge plan remains appropriate PT Frequency: 7X/week Follow Up Recommendations: Home health PT Equipment Recommended: None recommended by PT PT Goals  Acute Rehab PT Goals PT Goal: Supine/Side to Sit - Progress: Met PT Goal: Sit to Supine/Side - Progress: Met PT Transfer Goal: Sit to Stand/Stand to Sit - Progress: Met PT Goal: Ambulate - Progress: Met PT Goal: Up/Down Stairs - Progress: Met PT Goal: Perform Home Exercise Program - Progress: Progressing toward goal  PT Treatment Precautions/Restrictions  Precautions Precautions: Fall Restrictions Weight Bearing Restrictions: Yes RLE Weight Bearing: Weight bearing as tolerated Mobility (including Balance) Bed Mobility Bed Mobility: Yes Supine to Sit: 7: Independent Sit to Supine - Left: 7: Independent Transfers Transfers: Yes Sit to Stand: 6: Modified independent (Device/Increase time);From bed;With upper extremity assist Stand to Sit: 6: Modified independent (Device/Increase time);With upper extremity assist;To bed Ambulation/Gait Ambulation/Gait: Yes Ambulation/Gait Assistance: 6: Modified independent (Device/Increase time) Ambulation Distance (Feet): 200 Feet Assistive device: Rolling walker Stairs: Yes Stairs Assistance: 4: Min assist Stairs Assistance Details (indicate cue type and reason): cues for technique with RW Stair Management Technique: Backwards;With walker Number of Stairs: 2  Wheelchair Mobility Wheelchair Mobility: No    Exercise    End of Session PT - End of Session Equipment Utilized During Treatment: Gait belt Activity Tolerance: Patient tolerated treatment well Patient left: in bed;with call bell in reach Nurse Communication:  (pt ready for  D/C) General Behavior During Session: [facility] for tasks performed Cognition: [facility] for tasks performed  [name],  [phone] 10/10/2011, 10:21 AM

## 2011-10-10 NOTE — Progress Notes (Signed)
CARE MANAGEMENT NOTE 10/10/2011  Patient:  Matthew [name], Matthew [name]   Account Number:  [id number]  Date Initiated:  10/10/2011  Documentation initiated by:  Vance Peper  Subjective/Objective Assessment:   55 yr old male s/p right total knee arthroplasty.     Action/Plan:   Discharge planning. [facility] No changesd. Has DME, family assistance.   Anticipated DC Date:  10/10/2011   Anticipated DC Plan:  HOME W HOME HEALTH SERVICES      DC Planning Services  CM consult      Choice offered to / List presented to:  C-1 Patient        HH arranged  HH-2 PT      [facility] agency  [facility]   Status of service:  Completed, signed off Discharge Disposition:  HOME W HOME HEALTH SERVICES

## 2011-10-29 ENCOUNTER — Ambulatory Visit: Payer: BC Managed Care – PPO | Attending: Orthopedic Surgery | Admitting: Physical Therapy

## 2011-10-29 DIAGNOSIS — M25669 Stiffness of unspecified knee, not elsewhere classified: Secondary | ICD-10-CM | POA: Insufficient documentation

## 2011-10-29 DIAGNOSIS — M25569 Pain in unspecified knee: Secondary | ICD-10-CM | POA: Insufficient documentation

## 2011-10-29 DIAGNOSIS — IMO0001 Reserved for inherently not codable concepts without codable children: Secondary | ICD-10-CM | POA: Insufficient documentation

## 2011-10-29 DIAGNOSIS — R262 Difficulty in walking, not elsewhere classified: Secondary | ICD-10-CM | POA: Insufficient documentation

## 2011-10-31 ENCOUNTER — Ambulatory Visit: Payer: BC Managed Care – PPO | Admitting: Physical Therapy

## 2011-11-07 ENCOUNTER — Ambulatory Visit: Payer: BC Managed Care – PPO | Admitting: Physical Therapy

## 2011-11-09 ENCOUNTER — Ambulatory Visit: Payer: BC Managed Care – PPO | Admitting: Physical Therapy

## 2011-11-27 ENCOUNTER — Ambulatory Visit: Payer: BC Managed Care – PPO | Attending: Orthopedic Surgery | Admitting: Physical Therapy

## 2011-11-27 DIAGNOSIS — R262 Difficulty in walking, not elsewhere classified: Secondary | ICD-10-CM | POA: Insufficient documentation

## 2011-11-27 DIAGNOSIS — M25669 Stiffness of unspecified knee, not elsewhere classified: Secondary | ICD-10-CM | POA: Insufficient documentation

## 2011-11-27 DIAGNOSIS — M25569 Pain in unspecified knee: Secondary | ICD-10-CM | POA: Insufficient documentation

## 2011-11-27 DIAGNOSIS — IMO0001 Reserved for inherently not codable concepts without codable children: Secondary | ICD-10-CM | POA: Insufficient documentation

## 2011-11-29 ENCOUNTER — Ambulatory Visit: Payer: BC Managed Care – PPO | Admitting: Physical Therapy

## 2011-12-05 ENCOUNTER — Ambulatory Visit: Payer: BC Managed Care – PPO | Admitting: Rehabilitation

## 2014-02-05 ENCOUNTER — Emergency Department (HOSPITAL_COMMUNITY): Payer: BC Managed Care – PPO

## 2014-02-05 ENCOUNTER — Observation Stay (HOSPITAL_COMMUNITY)
Admission: EM | Admit: 2014-02-05 | Discharge: 2014-02-06 | Disposition: A | Discharge status: Home / Self Care | Payer: BC Managed Care – PPO | Attending: Internal Medicine | Admitting: Internal Medicine

## 2014-02-05 ENCOUNTER — Encounter (HOSPITAL_COMMUNITY): Payer: Self-pay | Admitting: Emergency Medicine

## 2014-02-05 DIAGNOSIS — R0789 Other chest pain: Principal | ICD-10-CM | POA: Insufficient documentation

## 2014-02-05 DIAGNOSIS — R0602 Shortness of breath: Secondary | ICD-10-CM | POA: Insufficient documentation

## 2014-02-05 DIAGNOSIS — R0600 Dyspnea, unspecified: Secondary | ICD-10-CM

## 2014-02-05 DIAGNOSIS — R05 Cough: Secondary | ICD-10-CM | POA: Insufficient documentation

## 2014-02-05 DIAGNOSIS — R5381 Other malaise: Secondary | ICD-10-CM | POA: Insufficient documentation

## 2014-02-05 DIAGNOSIS — IMO0002 Reserved for concepts with insufficient information to code with codable children: Secondary | ICD-10-CM

## 2014-02-05 DIAGNOSIS — M19019 Primary osteoarthritis, unspecified shoulder: Secondary | ICD-10-CM | POA: Insufficient documentation

## 2014-02-05 DIAGNOSIS — R5383 Other fatigue: Secondary | ICD-10-CM

## 2014-02-05 DIAGNOSIS — M1710 Unilateral primary osteoarthritis, unspecified knee: Secondary | ICD-10-CM | POA: Insufficient documentation

## 2014-02-05 DIAGNOSIS — R0609 Other forms of dyspnea: Secondary | ICD-10-CM

## 2014-02-05 DIAGNOSIS — Z79899 Other long term (current) drug therapy: Secondary | ICD-10-CM | POA: Insufficient documentation

## 2014-02-05 DIAGNOSIS — J3489 Other specified disorders of nose and nasal sinuses: Secondary | ICD-10-CM | POA: Insufficient documentation

## 2014-02-05 DIAGNOSIS — M129 Arthropathy, unspecified: Secondary | ICD-10-CM | POA: Insufficient documentation

## 2014-02-05 DIAGNOSIS — E860 Dehydration: Secondary | ICD-10-CM | POA: Diagnosis present

## 2014-02-05 DIAGNOSIS — R002 Palpitations: Secondary | ICD-10-CM | POA: Diagnosis present

## 2014-02-05 DIAGNOSIS — I10 Essential (primary) hypertension: Secondary | ICD-10-CM | POA: Insufficient documentation

## 2014-02-05 DIAGNOSIS — R0989 Other specified symptoms and signs involving the circulatory and respiratory systems: Secondary | ICD-10-CM

## 2014-02-05 DIAGNOSIS — R079 Chest pain, unspecified: Secondary | ICD-10-CM | POA: Diagnosis present

## 2014-02-05 DIAGNOSIS — R059 Cough, unspecified: Secondary | ICD-10-CM | POA: Insufficient documentation

## 2014-02-05 LAB — BASIC METABOLIC PANEL
BUN: 24 mg/dL — ABNORMAL HIGH (ref 6–23)
CO2: 20 mEq/L (ref 19–32)
Calcium: 9.3 mg/dL (ref 8.4–10.5)
Chloride: 100 mEq/L (ref 96–112)
Creatinine, Ser: 0.85 mg/dL (ref 0.50–1.35)
GFR calc Af Amer: >90 mL/min (ref >90)
GFR calc non Af Amer: >90 mL/min (ref >90)
Glucose, Bld: 106 mg/dL — ABNORMAL HIGH (ref 70–99)
Potassium: 4.4 mEq/L (ref 3.7–5.3)
Sodium: 136 mEq/L — ABNORMAL LOW (ref 137–147)

## 2014-02-05 LAB — D-DIMER, QUANTITATIVE: D-Dimer, Quant: <0.27 ug/mL-FEU (ref 0.00–0.48)

## 2014-02-05 LAB — PRO B NATRIURETIC PEPTIDE: Pro B Natriuretic peptide (BNP): 9.4 pg/mL (ref 0–125)

## 2014-02-05 LAB — I-STAT TROPONIN, ED: Troponin i, poc: 0.01 ng/mL (ref 0.00–0.08)

## 2014-02-05 LAB — CBC
HCT: 49.4 % (ref 39.0–52.0)
Hemoglobin: 17.9 g/dL — ABNORMAL HIGH (ref 13.0–17.0)
MCH: 32.7 pg (ref 26.0–34.0)
MCHC: 36.2 g/dL — ABNORMAL HIGH (ref 30.0–36.0)
MCV: 90.1 fL (ref 78.0–100.0)
Platelets: 206 10*3/uL (ref 150–400)
RBC: 5.48 MIL/uL (ref 4.22–5.81)
RDW: 12.9 % (ref 11.5–15.5)
WBC: 4.2 10*3/uL (ref 4.0–10.5)

## 2014-02-05 LAB — TROPONIN I: Troponin I: <0.3 ng/mL (ref <0.30)

## 2014-02-05 MED ORDER — HYDROMORPHONE HCL PF 1 MG/ML IJ SOLN: 1.0000 mg | INTRAMUSCULAR | Status: DC | PRN

## 2014-02-05 MED ORDER — ASPIRIN 325 MG PO TABS
325.0000 mg | ORAL_TABLET | Freq: Once | ORAL | Status: AC
  Administered 2014-02-05: 325 mg via ORAL
  Filled 2014-02-05: qty 1

## 2014-02-05 MED ORDER — ONDANSETRON HCL 4 MG PO TABS: 4.0000 mg | ORAL_TABLET | Freq: Four times a day (QID) | ORAL | Status: DC | PRN

## 2014-02-05 MED ORDER — ENOXAPARIN SODIUM 40 MG/0.4ML ~~LOC~~ SOLN
40.0000 mg | SUBCUTANEOUS | Status: DC
  Administered 2014-02-05: 40 mg via SUBCUTANEOUS
  Filled 2014-02-05 (×2): qty 0.4

## 2014-02-05 MED ORDER — SODIUM CHLORIDE 0.9 % IV BOLUS (SEPSIS)
1000.0000 mL | Freq: Once | INTRAVENOUS | Status: AC
  Administered 2014-02-05: 1000 mL via INTRAVENOUS

## 2014-02-05 MED ORDER — LISINOPRIL 20 MG PO TABS
20.0000 mg | ORAL_TABLET | Freq: Every day | ORAL | Status: DC
  Administered 2014-02-06: 20 mg via ORAL
  Filled 2014-02-05: qty 1

## 2014-02-05 MED ORDER — ALUM & MAG HYDROXIDE-SIMETH 200-200-20 MG/5ML PO SUSP: 30.0000 mL | Freq: Four times a day (QID) | ORAL | Status: DC | PRN

## 2014-02-05 MED ORDER — ONDANSETRON HCL 4 MG/2ML IJ SOLN: 4.0000 mg | Freq: Four times a day (QID) | INTRAMUSCULAR | Status: DC | PRN

## 2014-02-05 MED ORDER — ACETAMINOPHEN 325 MG PO TABS: 650.0000 mg | ORAL_TABLET | Freq: Four times a day (QID) | ORAL | Status: DC | PRN

## 2014-02-05 MED ORDER — ACETAMINOPHEN 650 MG RE SUPP: 650.0000 mg | Freq: Four times a day (QID) | RECTAL | Status: DC | PRN

## 2014-02-05 MED ORDER — HYDROCODONE-ACETAMINOPHEN 5-325 MG PO TABS: 1.0000 | ORAL_TABLET | ORAL | Status: DC | PRN

## 2014-02-05 NOTE — ED Notes (Signed)
Called lab and stated able to add D-dimer to available blood.

## 2014-02-05 NOTE — ED Notes (Addendum)
Admit PA at bedside  ?

## 2014-02-05 NOTE — ED Notes (Signed)
Pt sent here from PCP for eval of SOB x several weeks with some chest tightness

## 2014-02-05 NOTE — H&P (Signed)
History and Physical       Hospital Admission Note Date: 02/05/2014  Patient name: Matthew [name] [facility] Medical record number: [id number] Date of birth: 1955-11-15 Age: 58 y.o. Gender: male  PCP: GREEN, Keenan Bachelor, MD    Chief Complaint:   Palpitations with dyspnea  HPI: Patient is a 58 year old male with history of hypertension, arthritis, strong family history of coronary artery disease presented to the ER for chest palpitations and dyspnea, worse in the last 2 days. Patient reports that he has history of palpitations for years however in the last 2-3 days he's been having significant dizziness, worse on bending down and dyspnea on minimal exertion. Patient reports that he has noticed fatigue and episodes worsening with walking for short distances or heavy lifting. He also noticed orthopnea and had difficulty breathing on laying flat last night. He did have bronchitis 3 weeks ago and feels that he has mostly recovered from his bronchitis. He denied any recent weight gain or peripheral edema. He denied any syncopal episodes. He had no cardiac workup in the past except he thinks he may have had a stress test 15 years ago.  Review of Systems:  Constitutional: Denies fever, chills, diaphoresis, poor appetite and + fatigue.  HEENT: Denies photophobia, eye pain, redness, hearing loss, ear pain, congestion, sore throat, rhinorrhea, sneezing, mouth sores, trouble swallowing, neck pain, neck stiffness and tinnitus.   Respiratory: denies any chest tightness, see history of present illness  Cardiovascular: please see history of present illness  Gastrointestinal: Denies nausea, vomiting, abdominal pain, diarrhea, constipation, blood in stool and abdominal distention.  Genitourinary: Denies dysuria, urgency, frequency, hematuria, flank pain and difficulty urinating.  Musculoskeletal: Denies myalgias, back pain, joint swelling, arthralgias and gait problem.   Skin: Denies pallor, rash and wound.  Neurological: Denies numbness and headaches. + dizziness and lightheadedness  Hematological: Denies adenopathy. Easy bruising, personal or family bleeding history  Psychiatric/Behavioral: Denies suicidal ideation, mood changes, confusion, nervousness, sleep disturbance and agitation  Past Medical History: Past Medical History  Diagnosis Date  . Hypertension     FOLLOWED BY DR ED GREEN  . Arthritis     KNEES BACK SHOULDERS   Past Surgical History  Procedure Laterality Date  . Knee arthroscopy      L X 2  . Knee arthroplasty  2010    L  . Back surgery  2010    L5S1 FUSION  . Total knee arthroplasty  10/08/2011    Procedure: TOTAL KNEE ARTHROPLASTY;  Surgeon: Rudean Haskell, MD;  Location: [place];  Service: Orthopedics;  Laterality: Right;  TOTAL RIGHT KNEE ARTHROPLASTY     Medications: Prior to Admission medications   Medication Sig Start Date End Date Taking? Authorizing Provider  ibuprofen (ADVIL,MOTRIN) 200 MG tablet Take 200-400 mg by mouth every 6 (six) hours as needed for fever, headache, mild pain or moderate pain.   Yes Historical Provider, MD  lisinopril (PRINIVIL,ZESTRIL) 20 MG tablet Take 20 mg by mouth daily.     Yes Historical Provider, MD    Allergies:  No Known Allergies  Social History:  reports that he has never smoked. He does not have any smokeless tobacco history on file. He reports that he drinks about 0.6 ounces of alcohol per week. His drug history is not on file.  Family History: History reviewed. No pertinent family history.  Physical Exam: Blood pressure 140/87, pulse 96, temperature 97.5 F (36.4 C), temperature source Oral, resp. rate 18, SpO2 97.00%. General: Alert, awake, oriented x3,  in no acute distress. HEENT: normocephalic, atraumatic, anicteric sclera, pink conjunctiva, pupils equal and reactive to light and accomodation, oropharynx clear Neck: supple, no masses or lymphadenopathy, no goiter, no  bruits  Heart: Regular rate and rhythm, without murmurs, rubs or gallops. Lungs: Clear to auscultation bilaterally, no wheezing, rales or rhonchi. Abdomen: Soft, nontender, nondistended, positive bowel sounds, no masses. Extremities: No clubbing, cyanosis or edema with positive pedal pulses. Neuro: Grossly intact, no focal neurological deficits, strength 5/5 upper and lower extremities bilaterally Psych: alert and oriented x 3, normal mood and affect Skin: no rashes or lesions, warm and dry   LABS on Admission:  Basic Metabolic Panel:  Recent Labs Lab 02/05/14 1053  NA 136*  K 4.4  CL 100  CO2 20  GLUCOSE 106*  BUN 24*  CREATININE 0.85  CALCIUM 9.3   Liver Function Tests: No results found for this basename: AST, ALT, ALKPHOS, BILITOT, PROT, ALBUMIN,  in the last 168 hours No results found for this basename: LIPASE, AMYLASE,  in the last 168 hours No results found for this basename: AMMONIA,  in the last 168 hours CBC:  Recent Labs Lab 02/05/14 1053  WBC 4.2  HGB 17.9*  HCT 49.4  MCV 90.1  PLT 206   Cardiac Enzymes: No results found for this basename: CKTOTAL, CKMB, CKMBINDEX, TROPONINI,  in the last 168 hours BNP: No components found with this basename: POCBNP,  CBG: No results found for this basename: GLUCAP,  in the last 168 hours   Radiological Exams on Admission: Dg Chest 2 View  02/05/2014   CLINICAL DATA:  SHORTNESS OF BREATH CHEST PAIN  EXAM: CHEST  2 VIEW  COMPARISON:  DG CHEST 2 VIEW dated 09/27/2011  FINDINGS: The heart size and mediastinal contours are within normal limits. Both lungs are clear. The visualized skeletal structures are unremarkable.  IMPRESSION: No active cardiopulmonary disease.   Electronically Signed   By: Margaree Mackintosh M.D.   On: 02/05/2014 11:15    EKG showed normal sinus rhythm, possible left atrial enlargement, heart rate 93  Assessment/Plan Principal Problem:   Palpitations:  -Patient will be admitted for observation, cardiac  telemetry, rule out acute ACS - D-dimer is negative, doubt PE, patient has no risk factors for PE, no long-distance car travels or flights, no Homans's sign - Obtain serial cardiac enzymes, chest x-ray negative for any acute cardiopulmonary disease - Obtain 2-D echocardiogram for further workup -Cardiology consult obtained, may need event monitor or Holter monitor for further workup. He has a strong family history of coronary artery disease.  Active Problems:   Dyspnea:  - Unclear etiology, possibly residual bronchitis, chest x-ray shows no pneumonia. No signs of clinical fluid overload, BNP 9.4. - Obtain 2-D echo for further workup/ischemia/cardiomyopathy, serial cardiac enzymes    Dehydration - Hemoglobin 17.9, the patient had been recently sick with bronchitis, will provide gentle hydration   DVT prophylaxis:  Lovenox  CODE STATUS:  Full code  Family Communication: Admission, patients condition and plan of care including tests being ordered have been discussed with the patient  who indicates understanding and agree with the plan and Code Status   Further plan will depend as patient's clinical course evolves and further radiologic and laboratory data become available.   Time Spent on Admission: 1 hour  RAI,RIPUDEEP M.D. [facility] 02/05/2014, 4:31 PM Pager: [phone]  If 7PM-7AM, please contact night-coverage [email or web address] Password [email or web address]

## 2014-02-05 NOTE — ED Notes (Signed)
Cardiology at bedside.

## 2014-02-05 NOTE — ED Provider Notes (Signed)
CSN: [id number]     Arrival date & time 02/05/14  1043 History   First MD Initiated Contact with Patient 02/05/14 1112     Chief Complaint  Patient presents with  . Shortness of Breath  . Chest Pain     (Consider location/radiation/quality/duration/timing/severity/associated sxs/prior Treatment) The history is provided by the patient. No language interpreter was used.  Takuya Lariccia Wenzlick is a 58 year old male with past medical history of hypertension, arthritis presenting to the ED with chest palpitations that has been ongoing for the past 2-4 days. Patient reported that his palpitations are something new that he has never experienced before. Stated that they only occur with exertion with associated shortness of breath, difficulty breathing and fatigue. Reported that when he sits down and rests the discomfort subsides. Reported that the episodes occur with walking for short distances or heavy lifting. Stated that last night he noticed that when he lays down flat he has difficulty breathing, shortness of breath. Patient was seen and assessed by his primary care provider this morning, Dr. Conni Slipper who recommended patient come to the ED to be assessed. Patient reported that he had cold-like symptoms a couple of weeks ago regarding nasal congestion, chest congestion, and cough that he recently got over. Denied swelling to the legs, dizziness, nausea, vomiting, diarrhea, abdominal pain, hematochezia, melena, syncopal episodes. PCP Dr. Carlota Raspberry    Past Medical History  Diagnosis Date  . Hypertension     FOLLOWED BY DR ED GREEN  . Arthritis     KNEES BACK SHOULDERS   Past Surgical History  Procedure Laterality Date  . Knee arthroscopy      L X 2  . Knee arthroplasty  2010    L  . Back surgery  2010    L5S1 FUSION  . Total knee arthroplasty  10/08/2011    Procedure: TOTAL KNEE ARTHROPLASTY;  Surgeon: Rudean Haskell, MD;  Location: [place];  Service: Orthopedics;  Laterality: Right;  TOTAL  RIGHT KNEE ARTHROPLASTY    History reviewed. No pertinent family history. History  Substance Use Topics  . Smoking status: Never Smoker   . Smokeless tobacco: Not on file  . Alcohol Use: 0.6 oz/week    1 Cans of beer per week    Review of Systems  Constitutional: Negative for fever and chills.  HENT: Positive for congestion.   Eyes: Negative for visual disturbance.  Respiratory: Positive for cough and shortness of breath. Negative for wheezing and stridor.   Cardiovascular: Positive for palpitations. Negative for leg swelling.  Gastrointestinal: Negative for nausea, vomiting and abdominal pain.  Musculoskeletal: Negative for back pain and neck pain.  Neurological: Negative for dizziness, syncope and weakness.  All other systems reviewed and are negative.      Allergies  Review of patient's allergies indicates no known allergies.  Home Medications   Current Outpatient Rx  Name  Route  Sig  Dispense  Refill  . ibuprofen (ADVIL,MOTRIN) 200 MG tablet   Oral   Take 200-400 mg by mouth every 6 (six) hours as needed for fever, headache, mild pain or moderate pain.         Marland Kitchen lisinopril (PRINIVIL,ZESTRIL) 20 MG tablet   Oral   Take 20 mg by mouth daily.            BP 127/85  Pulse 84  Temp(Src) 97.5 F (36.4 C) (Oral)  Resp 18  SpO2 95% Physical Exam  Nursing note and vitals reviewed. Constitutional: He is oriented to  person, place, and time. He appears well-developed and well-nourished. No distress.  HENT:  Head: Normocephalic and atraumatic.  Mouth/Throat: Oropharynx is clear and moist. No oropharyngeal exudate.  Eyes: Conjunctivae and EOM are normal. Pupils are equal, round, and reactive to light. Right eye exhibits no discharge. Left eye exhibits no discharge.  Neck: Normal range of motion. Neck supple. No tracheal deviation present.  Cardiovascular: Normal rate, regular rhythm and normal heart sounds.  Exam reveals no friction rub.   No murmur  heard. Pulses:      Radial pulses are 2+ on the right side, and 2+ on the left side.       Dorsalis pedis pulses are 2+ on the right side, and 2+ on the left side.  Cap refill < 3 seconds Negative swelling or pitting edema noted to the lower extremities bilaterally Negative asymmetry noted to the lower extremities bilaterally Negative Homan's sign bilaterally   Pulmonary/Chest: Effort normal and breath sounds normal. No respiratory distress. He has no wheezes. He has no rales. He exhibits no tenderness.  Musculoskeletal: Normal range of motion.  Full ROM to upper and lower extremities without difficulty noted, negative ataxia noted.  Lymphadenopathy:    He has no cervical adenopathy.  Neurological: He is alert and oriented to person, place, and time. No cranial nerve deficit. He exhibits normal muscle tone. Coordination normal.  Cranial nerves III-XII grossly intact Strength 5+/5+ to upper and lower extremities bilaterally with resistance applied, equal distribution noted Equal grip strength   Skin: Skin is warm and dry. No rash noted. He is not diaphoretic. No erythema.  Psychiatric: He has a normal mood and affect. His behavior is normal. Thought content normal.    ED Course  Procedures (including critical care time)  2:44 PM This provider spoke with Dr. Grayland Ormond regarding patient and his concern. Recommended patient to be admitted for serial troponins and possible cardiac rule out based on age and history of HTN.   4:02 PM This provider spoke with Dr. Tyler Pita, [facility] - discussed case, history, presentation, labs, imaging in great detail. Patient to be admitted to the hospital for Telemetry observation for cardiac rule out.   Results for orders placed during the hospital encounter of 02/05/14  CBC      Result Value Ref Range   WBC 4.2  4.0 - 10.5 K/uL   RBC 5.48  4.22 - 5.81 MIL/uL   Hemoglobin 17.9 (*) 13.0 - 17.0 g/dL   HCT 49.4  39.0 - 52.0 %   MCV 90.1  78.0 - 100.0  fL   MCH 32.7  26.0 - 34.0 pg   MCHC 36.2 (*) 30.0 - 36.0 g/dL   RDW 12.9  11.5 - 15.5 %   Platelets 206  150 - 400 K/uL  BASIC METABOLIC PANEL      Result Value Ref Range   Sodium 136 (*) 137 - 147 mEq/L   Potassium 4.4  3.7 - 5.3 mEq/L   Chloride 100  96 - 112 mEq/L   CO2 20  19 - 32 mEq/L   Glucose, Bld 106 (*) 70 - 99 mg/dL   BUN 24 (*) 6 - 23 mg/dL   Creatinine, Ser 0.85  0.50 - 1.35 mg/dL   Calcium 9.3  8.4 - 10.5 mg/dL   GFR calc non Af Amer >90  >90 mL/min   GFR calc Af Amer >90  >90 mL/min  PRO B NATRIURETIC PEPTIDE      Result Value Ref Range  Pro B Natriuretic peptide (BNP) 9.4  0 - 125 pg/mL  D-DIMER, QUANTITATIVE      Result Value Ref Range   D-Dimer, Quant <0.27  0.00 - 0.48 ug/mL-FEU  I-STAT TROPOININ, ED      Result Value Ref Range   Troponin i, poc 0.01  0.00 - 0.08 ng/mL   Comment 3             Labs Review Labs Reviewed  CBC - Abnormal; Notable for the following:    Hemoglobin 17.9 (*)    MCHC 36.2 (*)    All other components within normal limits  BASIC METABOLIC PANEL - Abnormal; Notable for the following:    Sodium 136 (*)    Glucose, Bld 106 (*)    BUN 24 (*)    All other components within normal limits  PRO B NATRIURETIC PEPTIDE  D-DIMER, QUANTITATIVE  I-STAT TROPOININ, ED   Imaging Review Dg Chest 2 View  02/05/2014   CLINICAL DATA:  SHORTNESS OF BREATH CHEST PAIN  EXAM: CHEST  2 VIEW  COMPARISON:  DG CHEST 2 VIEW dated 09/27/2011  FINDINGS: The heart size and mediastinal contours are within normal limits. Both lungs are clear. The visualized skeletal structures are unremarkable.  IMPRESSION: No active cardiopulmonary disease.   Electronically Signed   By: Margaree Mackintosh M.D.   On: 02/05/2014 11:15     EKG Interpretation   Date/Time:  Friday February 05 2014 10:47:27 EDT Ventricular Rate:  93 PR Interval:  166 QRS Duration: 78 QT Interval:  342 QTC Calculation: 425 R Axis:   51 Text Interpretation:  Normal sinus rhythm Possible Left  atrial enlargement  Borderline ECG since last tracing no significant change Confirmed by Eulis Foster   MD, ELLIOTT [phone]) on 02/05/2014 11:30:55 AM      MDM   Final diagnoses:  Chest pain   Medications  sodium chloride 0.9 % bolus 1,000 mL (0 mLs Intravenous Stopped 02/05/14 1538)  aspirin tablet 325 mg (325 mg Oral Given 02/05/14 1355)  sodium chloride 0.9 % bolus 1,000 mL (1,000 mLs Intravenous New Bag/Given 02/05/14 1538)   Filed Vitals:   02/05/14 1333 02/05/14 1410 02/05/14 1445 02/05/14 1530  BP: 147/89 127/79 128/87 127/85  Pulse: 84 89 83 84  Temp:      TempSrc:      Resp: 20 20 18 18   SpO2: 97% 97% 95% 95%    Patient presenting to the ED with chest palpitations is been ongoing for the past 3 or 4 days associated with shortness of breath, difficulty breathing. Patient reported that a couple weeks ago he had cold-like symptoms and was treated with amoxicillin. Reported that he was seen and assessed by his primary care provider this morning who recommended patient be assessed in ED setting. Alert and oriented. GCS 15. Heart rate and rhythm normal. Lungs clear to auscultation to upper and lower lobes bilaterally. Radial and DP pulses 2+ bilaterally. Negative swelling or pitting edema localized to lower extremities bilaterally. Negative Homans sign bilaterally. Negative asymmetry noted to lower tremors bilaterally. Full range of motion to upper and lower extremities bilaterally without difficulty or ataxia. Strength intact with equal distribution to equal grip strength. EKG noted normal sinus rhythm with possible left atrial enlargement with a heart rate of 93 beats per minute-negative significant findings identified. Troponin negative elevation. CBC negative elevated white blood cell count. Hemoglobin noted to be mildly hemoconcentrated-17.9. Mild elevated BUN of 24 on the BMP. BNP 9.4. D-dimer negative elevation. Chest  x-ray negative acute cardiac pulmonary disease. HEART score 4. Doubt PE.  Doubt DVT. Patient presenting to the ED with chest pain, shortness of breath with exertion. Based on patient's age, comorbities patient to be admitted to the hospital for cardiac rule out. Discussed with patient's PCP who agreed to plan. Discussed with patient plan for admission - patient understood and agreed. Patient to be admitted to Telemetry observation Team 10 for cardiac rule out. Patient stable for transfer.   Jamse Mead, PA-C 02/05/14 1848

## 2014-02-05 NOTE — ED Notes (Signed)
PA at bedside.

## 2014-02-05 NOTE — Consult Note (Signed)
CARDIOLOGY CONSULT NOTE   Patient ID: REA RESER MRN: [id number] DOB/AGE: 1956-06-11 58 y.o.  Admit date: 02/05/2014  Primary Physician   GREEN, Keenan Bachelor, MD Primary Cardiologist   New Reason for Consultation   Chest tightness and SOB  HPI: KAINON VARADY is a 58 y.o. male with a history of HTN, arthritis and no past cardiac history who was sent from his primary care office to the [facility] ED for complaints of chest tightness, SOB and palpitations with exertion x2-4days. 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 It consistently comes on with exertion and is relieved with rest. He reports associated dizziness as well as recent generalized fatigue. No n/v, abdominal pain, blood in stool or urine, PND, orthopnea, or lower extremity swelling. He has never smoked and drinks moderately. He has some anxiety and takes xanax for sleep at night. He did have a cough and cold about 3 weeks ago, but no recent fevers chills or night sweats. He has a strong family history of heart disease. His brother had a massive MI at the age of 22 and he has heart disease in both his maternal and paternal grandparents and uncles/aunts.   Past Medical History  Diagnosis Date  . Hypertension     FOLLOWED BY DR ED GREEN  . Arthritis     KNEES BACK SHOULDERS     Past Surgical History  Procedure Laterality Date  . Knee arthroscopy      L X 2  . Knee arthroplasty  2010    L  . Back surgery  2010    L5S1 FUSION  . Total knee arthroplasty  10/08/2011    Procedure: TOTAL KNEE ARTHROPLASTY;  Surgeon: Rudean Haskell, MD;  Location: [place];  Service: Orthopedics;  Laterality: Right;  TOTAL RIGHT KNEE ARTHROPLASTY     No Known Allergies  I have reviewed the patient's current medications     Prior to Admission medications   Medication Sig Start Date End Date Taking? Authorizing Provider  ibuprofen  (ADVIL,MOTRIN) 200 MG tablet Take 200-400 mg by mouth every 6 (six) hours as needed for fever, headache, mild pain or moderate pain.   Yes Historical Provider, MD  lisinopril (PRINIVIL,ZESTRIL) 20 MG tablet Take 20 mg by mouth daily.     Yes Historical Provider, MD     History   Social History  . Marital Status: Divorced    Spouse Name: N/A    Number of Children: N/A  . Years of Education: N/A   Occupational History  . Not on file.   Social History Main Topics  . Smoking status: Never Smoker   . Smokeless tobacco: Not on file  . Alcohol Use: 0.6 oz/week    1 Cans of beer per week  . Drug Use:   . Sexual Activity:    Other Topics Concern  . Not on file   Social History Narrative  . No narrative on file    No family status information on file.   History reviewed. No pertinent family history.   ROS:  Full 14 point review of systems complete and found to be negative unless listed above.  Physical Exam: Blood pressure 140/87, pulse 96, temperature 97.5 F (36.4 C), temperature source Oral, resp. rate 18, SpO2 97.00%.  General: Well developed, well nourished, male in no acute distress Head: Eyes PERRLA, No xanthomas.   Normocephalic and atraumatic,  oropharynx without edema or exudate. Dentition:  Lungs: CTAB Heart: HRRR S1 S2, no rub/gallop, Heart irregular rate and rhythm with S1, S2  murmur. pulses are 2+ extrem.   Neck: No carotid bruits. No lymphadenopathy.  NO JVD. Abdomen: Bowel sounds present, abdomen soft and non-tender without masses or hernias noted. Msk:  No spine or cva tenderness. No weakness, no joint deformities or effusions. Extremities: No clubbing or cyanosis.  edema.  Neuro: Alert and oriented X 3. No focal deficits noted. Psych:  Good affect, responds appropriately Skin: No rashes or lesions noted.  Labs:   Lab Results  Component Value Date   WBC 4.2 02/05/2014   HGB 17.9* 02/05/2014   HCT 49.4 02/05/2014   MCV 90.1 02/05/2014   PLT 206 02/05/2014       Recent Labs Lab 02/05/14 1053  NA 136*  K 4.4  CL 100  CO2 20  BUN 24*  CREATININE 0.85  CALCIUM 9.3  GLUCOSE 106*    Recent Labs  02/05/14 1114  TROPIPOC 0.01   Pro B Natriuretic peptide (BNP)  Date/Time Value Ref Range Status  02/05/2014 10:53 AM 9.4  0 - 125 pg/mL Final    Lab Results  Component Value Date   DDIMER <0.27 02/05/2014    Echo: Pending  ECG:  NSR with possible LAE  Radiology:  Dg Chest 2 View  02/05/2014   CLINICAL DATA:  SHORTNESS OF BREATH CHEST PAIN  EXAM: CHEST  2 VIEW  COMPARISON:  DG CHEST 2 VIEW dated 09/27/2011  FINDINGS: The heart size and mediastinal contours are within normal limits. Both lungs are clear. The visualized skeletal structures are unremarkable.  IMPRESSION: No active cardiopulmonary disease.   Electronically Signed   By: Margaree Mackintosh M.D.   On: 02/05/2014 11:15    ASSESSMENT AND PLAN:    Principal Problem:   Palpitations Active Problems:   Chest pain   Dyspnea   Dehydration  HPI: ESPN ZEMAN is a 58 y.o. male with a history of HTN, arthritis and no past cardiac history who was sent from his primary care office to the [facility] ED for complaints of chest tightness, SOB and palpitations with exertion x2-4days.   Chest pain- chest pain/SOB with exertion and relieved with rest. Has a strong family history of heart disease.  --Troponin x1 negative, ECG with no acute ST or TW changes --Will admit to telemetry for observation  --Cycle Troponin and serial ECGs -- Will make NPO after midnight for a exercise myovue in the morning.   HTN- continue lisinopril   Signed: Ranae Plumber 02/05/2014 4:50 PM Pager [phone] Co-Sign MD  Patient seen and examined with Perry Mount, PA-C. We discussed all aspects of the encounter. I agree with the assessment and plan as stated above.   Symptoms with typical and atypical features. + multiple CRFs. ECG and troponin normal. Exam unremarkable. Will need further risk  stratification. Agree with observing overnight. If CE remain negative proceed with ETT/Myoview in am.   Benay Spice 5:21 PM

## 2014-02-05 NOTE — ED Notes (Signed)
Patient states deciding to leave or be admitting and requested to speak with the PA. PA notified.

## 2014-02-06 ENCOUNTER — Other Ambulatory Visit: Payer: Self-pay

## 2014-02-06 ENCOUNTER — Observation Stay (HOSPITAL_COMMUNITY): Payer: BC Managed Care – PPO

## 2014-02-06 DIAGNOSIS — R079 Chest pain, unspecified: Secondary | ICD-10-CM

## 2014-02-06 LAB — CBC
HCT: 44.7 % (ref 39.0–52.0)
Hemoglobin: 15.9 g/dL (ref 13.0–17.0)
MCH: 32.4 pg (ref 26.0–34.0)
MCHC: 35.6 g/dL (ref 30.0–36.0)
MCV: 91 fL (ref 78.0–100.0)
Platelets: 185 10*3/uL (ref 150–400)
RBC: 4.91 MIL/uL (ref 4.22–5.81)
RDW: 13.3 % (ref 11.5–15.5)
WBC: 5.1 10*3/uL (ref 4.0–10.5)

## 2014-02-06 LAB — BASIC METABOLIC PANEL
BUN: 18 mg/dL (ref 6–23)
CO2: 20 mEq/L (ref 19–32)
Calcium: 8.8 mg/dL (ref 8.4–10.5)
Chloride: 102 mEq/L (ref 96–112)
Creatinine, Ser: 0.87 mg/dL (ref 0.50–1.35)
GFR calc Af Amer: >90 mL/min (ref >90)
GFR calc non Af Amer: >90 mL/min (ref >90)
Glucose, Bld: 101 mg/dL — ABNORMAL HIGH (ref 70–99)
Potassium: 4.2 mEq/L (ref 3.7–5.3)
Sodium: 136 mEq/L — ABNORMAL LOW (ref 137–147)

## 2014-02-06 LAB — TROPONIN I
Troponin I: <0.3 ng/mL (ref <0.30)
Troponin I: <0.3 ng/mL (ref <0.30)

## 2014-02-06 LAB — TSH: TSH: 1.397 u[IU]/mL (ref 0.350–4.500)

## 2014-02-06 MED ORDER — TECHNETIUM TC 99M SESTAMIBI - CARDIOLITE
30.0000 | Freq: Once | INTRAVENOUS | Status: AC | PRN
  Administered 2014-02-06: 10:00:00 30 via INTRAVENOUS

## 2014-02-06 MED ORDER — TRAMADOL HCL 50 MG PO TABS: 50.0000 mg | ORAL_TABLET | Freq: Two times a day (BID) | ORAL | Status: DC | PRN

## 2014-02-06 MED ORDER — TECHNETIUM TC 99M SESTAMIBI - CARDIOLITE
10.0000 | Freq: Once | INTRAVENOUS | Status: AC | PRN
  Administered 2014-02-06: 09:00:00 10 via INTRAVENOUS

## 2014-02-06 MED ORDER — ASPIRIN EC 81 MG PO TBEC: 81.0000 mg | DELAYED_RELEASE_TABLET | Freq: Every day | ORAL | Status: DC

## 2014-02-06 MED ORDER — NITROGLYCERIN 0.4 MG SL SUBL: 0.4000 mg | SUBLINGUAL_TABLET | SUBLINGUAL | Status: DC | PRN

## 2014-02-06 NOTE — Progress Notes (Signed)
Subjective: No further complaints.  Objective: Vital signs in last 24 hours: Temp:  [97.5 F (36.4 C)-98.8 F (37.1 C)] 98.2 F (36.8 C) (03/28 0500) Pulse Rate:  [71-105] 85 (03/28 0929) Resp:  [16-22] 18 (03/28 0500) BP: (112-151)/(78-100) 151/99 mmHg (03/28 0929) SpO2:  [90 %-97 %] 96 % (03/28 0500) Weight:  [233 lb (105.688 kg)] 233 lb (105.688 kg) (03/27 1802) Weight change:  Last BM Date: 02/05/14 Intake/Output from previous day: 03/27 0701 - 03/28 0700 In: -  Out: 380 [Urine:380] Intake/Output this shift:    JO:ACZYSAY:TKZSWFUX affect, NAD Skin:Warm and dry, brisk capillary refill Heart:S1S2 RRR without murmur, gallup, rub or click Lungs:clear without rales, rhonchi, or wheezes NAT:FTDD, non tender, + BS, do not palpate liver spleen or masses Neuro:alert and oriented, MAE, follows commands, + facial symmetry  Lab Results:  Recent Labs  02/05/14 1053 02/06/14 0040  WBC 4.2 5.1  HGB 17.9* 15.9  HCT 49.4 44.7  PLT 206 185   BMET  Recent Labs  02/05/14 1053 02/06/14 0040  NA 136* 136*  K 4.4 4.2  CL 100 102  CO2 20 20  GLUCOSE 106* 101*  BUN 24* 18  CREATININE 0.85 0.87  CALCIUM 9.3 8.8    Recent Labs  02/06/14 0040 02/06/14 0703  TROPONINI <0.30 <0.30    No results found for this basename: CHOL, HDL, LDLCALC, LDLDIRECT, TRIG, CHOLHDL   No results found for this basename: HGBA1C     Lab Results  Component Value Date   TSH 1.397 02/05/2014    Hepatic Function Panel No results found for this basename: PROT, ALBUMIN, AST, ALT, ALKPHOS, BILITOT, BILIDIR, IBILI,  in the last 72 hours No results found for this basename: CHOL,  in the last 72 hours No results found for this basename: PROTIME,  in the last 72 hours     Studies/Results: Dg Chest 2 View  02/05/2014   CLINICAL DATA:  SHORTNESS OF BREATH CHEST PAIN  EXAM: CHEST  2 VIEW  COMPARISON:  DG CHEST 2 VIEW dated 09/27/2011  FINDINGS: The heart size and mediastinal  contours are within normal limits. Both lungs are clear. The visualized skeletal structures are unremarkable.  IMPRESSION: No active cardiopulmonary disease.   Electronically Signed   By: Margaree Mackintosh M.D.   On: 02/05/2014 11:15    Medications: I have reviewed the patient's current medications. Scheduled Meds: . enoxaparin (LOVENOX) injection  40 mg Subcutaneous Q24H  . lisinopril  20 mg Oral Daily   Continuous Infusions:  PRN Meds:.acetaminophen, acetaminophen, alum & mag hydroxide-simeth, HYDROcodone-acetaminophen, HYDROmorphone (DILAUDID) injection, ondansetron (ZOFRAN) IV, ondansetron  Assessment/Plan: Principal Problem:   Palpitations, none since admit,none with stress test Active Problems:   Chest pain- neg, MI no pain with stress test.   Dyspnea   Dehydration  Exercise myoview completed without complications.  + dyspnea, and HTN took 7 min for HR and BP to decrease to pre procedure numbers.  Nuclear stress study is normal. There is no scar or ischemia. No further workup is needed.   LOS: 1 day    [facility] R  Nurse Practitioner Certified Pager [phone] or after 5pm and on weekends call [phone] 02/06/2014, 10:06 AM  Patient seen and examined. I agree with the assessment and plan as detailed above. See also my additional thoughts below.   Nuclear study is normal. The patient does not need a 2-D echo. He can be discharged home.  Matthew Argyle, MD, [facility] 02/06/2014 1:21 PM

## 2014-02-06 NOTE — Progress Notes (Signed)
Utilization review completed.  P.J. Ayeden Gladman,RN,BSN Case Manager 

## 2014-02-06 NOTE — Discharge Instructions (Signed)
Cardiac Diet °This diet can help prevent heart disease and stroke. Many factors influence your heart health, including eating and exercise habits. Coronary risk rises a lot with abnormal blood fat (lipid) levels. Cardiac meal planning includes limiting unhealthy fats, increasing healthy fats, and making other small dietary changes. General guidelines are as follows: °· Adjust calorie intake to reach and maintain desirable body weight. °· Limit total fat intake to less than 30% of total calories. Saturated fat should be less than 7% of calories. °· Saturated fats are found in animal products and in some vegetable products. Saturated vegetable fats are found in coconut oil, cocoa butter, palm oil, and palm kernel oil. Read labels carefully to avoid these products as much as possible. Use butter in moderation. Choose tub margarines and oils that have 2 grams of fat or less. Good cooking oils are canola and olive oils. °· Practice low-fat cooking techniques. Do not fry food. Instead, broil, bake, boil, steam, grill, roast on a rack, stir-fry, or microwave it. Other fat reducing suggestions include: °· Remove the skin from poultry. °· Remove all visible fat from meats. °· Skim the fat off stews, soups, and gravies before serving them. °· Steam vegetables in water or broth instead of sautéing them in fat. °· Avoid foods with trans fat (or hydrogenated oils), such as commercially fried foods and commercially baked goods. Commercial shortening and deep-frying fats will contain trans fat. °· Increase intake of fruits, vegetables, whole grains, and legumes to replace foods high in fat. °· Increase consumption of nuts, legumes, and seeds to at least 4 servings weekly. One serving of a legume equals ½ cup, and 1 serving of nuts or seeds equals ¼ cup. °· Choose whole grains more often. Have 3 servings per day (a serving is 1 ounce [oz]). °· Eat 4 to 5 servings of vegetables per day. A serving of vegetables is 1 cup of raw leafy  vegetables; ½ cup of raw or cooked cut-up vegetables; ½ cup of vegetable juice. °· Eat 4 to 5 servings of fruit per day. A serving of fruit is 1 medium whole fruit; ¼ cup of dried fruit; ½ cup of fresh, frozen, or canned fruit; ½ cup of 100% fruit juice. °· Increase your intake of dietary fiber to 20 to 30 grams per day. Insoluble fiber may help lower your risk of heart disease and may help curb your appetite.  °Soluble fiber binds cholesterol to be removed from the blood. Foods high in soluble fiber are dried beans, citrus fruits, oats, apples, bananas, broccoli, Brussels sprouts, and eggplant. °· Try to include foods fortified with plant sterols or stanols, such as yogurt, breads, juices, or margarines. Choose several fortified foods to achieve a daily intake of 2 to 3 grams of plant sterols or stanols. °· Foods with omega-3 fats can help reduce your risk of heart disease. Aim to have a 3.5 oz portion of fatty fish twice per week, such as salmon, mackerel, albacore tuna, sardines, lake trout, or herring. If you wish to take a fish oil supplement, choose one that contains 1 gram of both DHA and EPA. °· Limit processed meats to 2 servings (3 oz portion) weekly. °· Limit the sodium in your diet to 1500 milligrams (mg) per day. If you have high blood pressure, talk to a registered dietitian about a DASH (Dietary Approaches to Stop Hypertension) eating plan. °· Limit sweets and beverages with added sugar, such as soda, to no more than 5 servings per week. One   serving is:   °· 1 tablespoon sugar. °· 1 tablespoon jelly or jam. °· ½ cup sorbet. °· 1 cup lemonade. °· ½ cup regular soda. °CHOOSING FOODS °Starches °· Allowed: Breads: All kinds (wheat, rye, raisin, white, oatmeal, Italian, French, and English muffin bread). Low-fat rolls: English muffins, frankfurter and hamburger buns, bagels, pita bread, tortillas (not fried). Pancakes, waffles, biscuits, and muffins made with recommended oil. °· Avoid: Products made with  saturated or trans fats, oils, or whole milk products. Butter rolls, cheese breads, croissants. Commercial doughnuts, muffins, sweet rolls, biscuits, waffles, pancakes, store-bought mixes. °Crackers °· Allowed: Low-fat crackers and snacks: Animal, graham, rye, saltine (with recommended oil, no lard), oyster, and matzo crackers. Bread sticks, melba toast, rusks, flatbread, pretzels, and light popcorn. °· Avoid: High-fat crackers: cheese crackers, butter crackers, and those made with coconut, palm oil, or trans fat (hydrogenated oils). Buttered popcorn. °Cereals °· Allowed: Hot or cold whole-grain cereals. °· Avoid: Cereals containing coconut, hydrogenated vegetable fat, or animal fat. °Potatoes / Pasta / Rice °· Allowed: All kinds of potatoes, rice, and pasta (such as macaroni, spaghetti, and noodles). °· Avoid: Pasta or rice prepared with cream sauce or high-fat cheese. Chow mein noodles, French fries. °Vegetables °· Allowed: All vegetables and vegetable juices. °· Avoid: Fried vegetables. Vegetables in cream, butter, or high-fat cheese sauces. Limit coconut. Fruit in cream or custard. °Protein °· Allowed: Limit your intake of meat, seafood, and poultry to no more than 6 oz (cooked weight) per day. All lean, well-trimmed beef, veal, pork, and lamb. All chicken and turkey without skin. All fish and shellfish. Wild game: wild duck, rabbit, pheasant, and venison. Egg whites or low-cholesterol egg substitutes may be used as desired. Meatless dishes: recipes with dried beans, peas, lentils, and tofu (soybean curd). Seeds and nuts: all seeds and most nuts. °· Avoid: Prime grade and other heavily marbled and fatty meats, such as short ribs, spare ribs, rib eye roast or steak, frankfurters, sausage, bacon, and high-fat luncheon meats, mutton. Caviar. Commercially fried fish. Domestic duck, goose, venison sausage. Organ meats: liver, gizzard, heart, chitterlings, brains, kidney, sweetbreads. °Dairy °· Allowed: Low-fat  cheeses: nonfat or low-fat cottage cheese (1% or 2% fat), cheeses made with part skim milk, such as mozzarella, farmers, string, or ricotta. (Cheeses should be labeled no more than 2 to 6 grams fat per oz.). Skim (or 1%) milk: liquid, powdered, or evaporated. Buttermilk made with low-fat milk. Drinks made with skim or low-fat milk or cocoa. Chocolate milk or cocoa made with skim or low-fat (1%) milk. Nonfat or low-fat yogurt. °· Avoid: Whole milk cheeses, including colby, cheddar, muenster, Monterey Jack, Havarti, Brie, Camembert, American, Swiss, and blue. Creamed cottage cheese, cream cheese. Whole milk and whole milk products, including buttermilk or yogurt made from whole milk, drinks made from whole milk. Condensed milk, evaporated whole milk, and 2% milk. °Soups and Combination Foods °· Allowed: Low-fat low-sodium soups: broth, dehydrated soups, homemade broth, soups with the fat removed, homemade cream soups made with skim or low-fat milk. Low-fat spaghetti, lasagna, chili, and Spanish rice if low-fat ingredients and low-fat cooking techniques are used. °· Avoid: Cream soups made with whole milk, cream, or high-fat cheese. All other soups. °Desserts and Sweets °· Allowed: Sherbet, fruit ices, gelatins, meringues, and angel food cake. Homemade desserts with recommended fats, oils, and milk products. Jam, jelly, honey, marmalade, sugars, and syrups. Pure sugar candy, such as gum drops, hard candy, jelly beans, marshmallows, mints, and small amounts of dark chocolate. °· Avoid: Commercially prepared   cakes, pies, cookies, frosting, pudding, or mixes for these products. Desserts containing whole milk products, chocolate, coconut, lard, palm oil, or palm kernel oil. Ice cream or ice cream drinks. Candy that contains chocolate, coconut, butter, hydrogenated fat, or unknown ingredients. Buttered syrups. °Fats and Oils °· Allowed: Vegetable oils: safflower, sunflower, corn, soybean, cottonseed, sesame, canola, olive,  or peanut. Non-hydrogenated margarines. Salad dressing or mayonnaise: homemade or commercial, made with a recommended oil. Low or nonfat salad dressing or mayonnaise. °· Limit added fats and oils to 6 to 8 tsp per day (includes fats used in cooking, baking, salads, and spreads on bread). Remember to count the "hidden fats" in foods. °· Avoid: Solid fats and shortenings: butter, lard, salt pork, bacon drippings. Gravy containing meat fat, shortening, or suet. Cocoa butter, coconut. Coconut oil, palm oil, palm kernel oil, or hydrogenated oils: these ingredients are often used in bakery products, nondairy creamers, whipped toppings, candy, and commercially fried foods. Read labels carefully. Salad dressings made of unknown oils, sour cream, or cheese, such as blue cheese and Roquefort. Cream, all kinds: half-and-half, light, heavy, or whipping. Sour cream or cream cheese (even if "light" or low-fat). Nondairy cream substitutes: coffee creamers and sour cream substitutes made with palm, palm kernel, hydrogenated oils, or coconut oil. °Beverages °· Allowed: Coffee (regular or decaffeinated), tea. Diet carbonated beverages, mineral water. Alcohol: Check with your caregiver. Moderation is recommended. °· Avoid: Whole milk, regular sodas, and juice drinks with added sugar. °Condiments °· Allowed: All seasonings and condiments. Cocoa powder. "Cream" sauces made with recommended ingredients. °· Avoid: Carob powder made with hydrogenated fats. °SAMPLE MENU °Breakfast °· ½ cup orange juice °· ½ cup oatmeal °· 1 slice toast °· 1 tsp margarine °· 1 cup skim milk °Lunch °· Turkey sandwich with 2 oz turkey, 2 slices bread °· Lettuce and tomato slices °· Fresh fruit °· Carrot sticks °· Coffee or tea °Snack °· Fresh fruit or low-fat crackers °Dinner °· 3 oz lean ground beef °· 1 baked potato °· 1 tsp margarine °· ½ cup asparagus °· Lettuce salad °· 1 tbs non-creamy dressing °· ½ cup peach slices °· 1 cup skim milk °Document Released:  08/07/2008 Document Revised: 04/29/2012 Document Reviewed: 01/22/2012 °[employer]® Patient Information ©2014 [employer]. ° °

## 2014-02-06 NOTE — Discharge Summary (Signed)
Physician Discharge Summary  Patient ID: MAYER VONDRAK MRN: [id number] DOB/AGE: 02-11-1956 58 y.o.  Admit date: 02/05/2014 Discharge date: 02/06/2014  Primary Care Physician:  Criselda Peaches, MD  Discharge Diagnoses:    . Chest pain . Palpitations . Dyspnea . Dehydration  Consults: Cardiology, Dr. Haroldine Laws   Allergies:  No Known Allergies   Discharge Medications:   Medication List         ALPRAZolam 1 MG tablet  Commonly known as:  XANAX  Take 1 mg by mouth once as needed for sleep.     aspirin EC 81 MG tablet  Take 1 tablet (81 mg total) by mouth daily.     ibuprofen 200 MG tablet  Commonly known as:  ADVIL,MOTRIN  Take 200-400 mg by mouth every 6 (six) hours as needed for fever, headache, mild pain or moderate pain.     lisinopril 20 MG tablet  Commonly known as:  PRINIVIL,ZESTRIL  Take 20 mg by mouth daily.     nitroGLYCERIN 0.4 MG SL tablet  Commonly known as:  NITROSTAT  Place 1 tablet (0.4 mg total) under the tongue every 5 (five) minutes as needed for chest pain.     traMADol 50 MG tablet  Commonly known as:  ULTRAM  Take 1 tablet (50 mg total) by mouth every 12 (twelve) hours as needed for severe pain.         Brief H and P: For complete details please refer to admission H and P, but in brief Patient is a 58 year old male with history of hypertension, arthritis, strong family history of coronary artery disease presented to the ER for chest palpitations and dyspnea, worse in the last 2 days. Patient reports that he has history of palpitations for years however in the last 2-3 days he's been having significant dizziness, worse on bending down and dyspnea on minimal exertion. Patient reports that he has noticed fatigue and episodes worsening with walking for short distances or heavy lifting. He also noticed orthopnea and had difficulty breathing on laying flat last night. He did have bronchitis 3 weeks ago and feels that he has mostly recovered from  his bronchitis. He denied any recent weight gain or peripheral edema. He denied any syncopal episodes. He had no cardiac workup in the past except he thinks he may have had a stress test 15 years ago.  Hospital Course:  Palpitations with chest discomfort -Patient was admitted for rule out and observation. He was ruled out for acute disease, cardiac enzymes remained negative. D-dimer is negative. Patient has noticed factors for pulmonary embolism. Chest x-ray negative for any acute cardiopulmonary disease. TSH is within normal limits. Cardiology consult was obtained and recommended nuclear medicine stress test given his strong family history of CAD, hypertension. Nuclear medicine stress test showed EF of 70%, normal study with no evidence of inducible myocardial ischemia with treadmill exercise test. Patient was cleared for discharge by Dr Ron Parker.   Day of Discharge BP 139/87  Pulse 101  Temp(Src) 98.2 F (36.8 C) (Oral)  Resp 18  Ht 5\' 7"  (1.702 m)  Wt 105.688 kg (233 lb)  BMI 36.48 kg/m2  SpO2 96%  Physical Exam: General: Alert and awake oriented x3 not in any acute distress. HEENT: anicteric sclera, pupils reactive to light and accommodation CVS: S1-S2 clear no murmur rubs or gallops Chest: clear to auscultation bilaterally, no wheezing rales or rhonchi Abdomen: soft nontender, nondistended, normal bowel sounds Extremities: no cyanosis, clubbing or edema noted bilaterally Neuro: Cranial  nerves II-XII intact, no focal neurological deficits   The results of significant diagnostics from this hospitalization (including imaging, microbiology, ancillary and laboratory) are listed below for reference.    LAB RESULTS: Basic Metabolic Panel:  Recent Labs Lab 02/05/14 1053 02/06/14 0040  NA 136* 136*  K 4.4 4.2  CL 100 102  CO2 20 20  GLUCOSE 106* 101*  BUN 24* 18  CREATININE 0.85 0.87  CALCIUM 9.3 8.8   Liver Function Tests: No results found for this basename: AST, ALT, ALKPHOS,  BILITOT, PROT, ALBUMIN,  in the last 168 hours No results found for this basename: LIPASE, AMYLASE,  in the last 168 hours No results found for this basename: AMMONIA,  in the last 168 hours CBC:  Recent Labs Lab 02/05/14 1053 02/06/14 0040  WBC 4.2 5.1  HGB 17.9* 15.9  HCT 49.4 44.7  MCV 90.1 91.0  PLT 206 185   Cardiac Enzymes:  Recent Labs Lab 02/06/14 0040 02/06/14 0703  TROPONINI <0.30 <0.30   BNP: No components found with this basename: POCBNP,  CBG: No results found for this basename: GLUCAP,  in the last 168 hours  Significant Diagnostic Studies:  Dg Chest 2 View  02/05/2014   CLINICAL DATA:  SHORTNESS OF BREATH CHEST PAIN  EXAM: CHEST  2 VIEW  COMPARISON:  DG CHEST 2 VIEW dated 09/27/2011  FINDINGS: The heart size and mediastinal contours are within normal limits. Both lungs are clear. The visualized skeletal structures are unremarkable.  IMPRESSION: No active cardiopulmonary disease.   Electronically Signed   By: Margaree Mackintosh M.D.   On: 02/05/2014 11:15      Disposition and Follow-up: Discharge Orders   Future Orders Complete By Expires   Diet - low sodium heart healthy  As directed    Increase activity slowly  As directed        DISPOSITION: Home  DIET: Heart healthy diet    DISCHARGE FOLLOW-UP Follow-up Information   Follow up with GREEN, Keenan Bachelor, MD. Schedule an appointment as soon as possible for a visit in 2 weeks. (for hospital follow-up)    Specialty:  Internal Medicine   Contact information:   [address] [place] [address] [phone]       Time spent on Discharge: 35 minutes  Signed:   Jahnae Mcadoo M.D. [facility] 02/06/2014, 1:27 PM Pager: [phone]

## 2014-02-07 NOTE — ED Provider Notes (Signed)
Medical screening examination/treatment/procedure(s) were performed by non-physician practitioner and as supervising physician I was immediately available for consultation/collaboration.   EKG Interpretation   Date/Time:  Friday February 05 2014 10:47:27 EDT Ventricular Rate:  93 PR Interval:  166 QRS Duration: 78 QT Interval:  342 QTC Calculation: 425 R Axis:   51 Text Interpretation:  Normal sinus rhythm Possible Left atrial enlargement  Borderline ECG since last tracing no significant change Confirmed by Eulis Foster   MD, Desiree Daise [phone]) on 02/05/2014 11:30:55 AM       Richarda Blade, MD 02/07/14 2120

## 2015-08-16 NOTE — Progress Notes (Signed)
NEEDS ORDERS FOR 08-14-15 SURGERY IN [facility]

## 2015-08-29 ENCOUNTER — Other Ambulatory Visit (HOSPITAL_COMMUNITY): Payer: Self-pay | Admitting: Orthopaedic Surgery

## 2015-08-30 ENCOUNTER — Other Ambulatory Visit (HOSPITAL_COMMUNITY): Payer: Self-pay | Admitting: *Deleted

## 2015-08-31 ENCOUNTER — Encounter (HOSPITAL_COMMUNITY)
Admission: RE | Admit: 2015-08-31 | Discharge: 2015-08-31 | Disposition: A | Discharge status: Home / Self Care | Payer: BLUE CROSS/BLUE SHIELD | Source: Ambulatory Visit | Attending: Orthopaedic Surgery | Admitting: Orthopaedic Surgery

## 2015-08-31 ENCOUNTER — Encounter (HOSPITAL_COMMUNITY): Payer: Self-pay

## 2015-08-31 DIAGNOSIS — M1611 Unilateral primary osteoarthritis, right hip: Secondary | ICD-10-CM | POA: Diagnosis not present

## 2015-08-31 DIAGNOSIS — Z01818 Encounter for other preprocedural examination: Secondary | ICD-10-CM | POA: Insufficient documentation

## 2015-08-31 HISTORY — DX: Personal history of other malignant neoplasm of skin: Z85.828

## 2015-08-31 HISTORY — DX: Anxiety disorder, unspecified: F41.9

## 2015-08-31 HISTORY — DX: Malignant (primary) neoplasm, unspecified: C80.1

## 2015-08-31 LAB — PROTIME-INR
INR: 1.12 (ref 0.00–1.49)
Prothrombin Time: 14.6 seconds (ref 11.6–15.2)

## 2015-08-31 LAB — BASIC METABOLIC PANEL
Anion gap: 9 (ref 5–15)
BUN: 25 mg/dL — ABNORMAL HIGH (ref 6–20)
CO2: 26 mmol/L (ref 22–32)
Calcium: 9.5 mg/dL (ref 8.9–10.3)
Chloride: 100 mmol/L — ABNORMAL LOW (ref 101–111)
Creatinine, Ser: 0.97 mg/dL (ref 0.61–1.24)
GFR calc Af Amer: >60 mL/min (ref >60)
GFR calc non Af Amer: >60 mL/min (ref >60)
Glucose, Bld: 101 mg/dL — ABNORMAL HIGH (ref 65–99)
Potassium: 4.1 mmol/L (ref 3.5–5.1)
Sodium: 135 mmol/L (ref 135–145)

## 2015-08-31 LAB — SURGICAL PCR SCREEN
MRSA, PCR: NEGATIVE
Staphylococcus aureus: NEGATIVE

## 2015-08-31 LAB — CBC
HCT: 47.8 % (ref 39.0–52.0)
Hemoglobin: 16.3 g/dL (ref 13.0–17.0)
MCH: 31.8 pg (ref 26.0–34.0)
MCHC: 34.1 g/dL (ref 30.0–36.0)
MCV: 93.2 fL (ref 78.0–100.0)
Platelets: 233 10*3/uL (ref 150–400)
RBC: 5.13 MIL/uL (ref 4.22–5.81)
RDW: 13.1 % (ref 11.5–15.5)
WBC: 5.4 10*3/uL (ref 4.0–10.5)

## 2015-08-31 LAB — APTT: aPTT: 34 seconds (ref 24–37)

## 2015-08-31 NOTE — Patient Instructions (Signed)
YOUR PROCEDURE IS SCHEDULED ON : 09/09/15  REPORT TO [facility] MAIN ENTRANCE FOLLOW SIGNS TO EAST ELEVATOR - GO TO 3rd FLOOR CHECK IN AT 3 EAST NURSES STATION (SHORT STAY) AT: 5:30 AM  CALL THIS NUMBER IF YOU HAVE PROBLEMS THE MORNING OF SURGERY [phone]  REMEMBER:ONLY 1 PER PERSON MAY GO TO SHORT STAY WITH YOU TO GET READY THE MORNING OF YOUR SURGERY  DO NOT EAT FOOD OR DRINK LIQUIDS AFTER MIDNIGHT  TAKE THESE MEDICINES THE MORNING OF SURGERY:  MAY TAKE XANAX OR OXYCODONE IF NEEDED  YOU MAY NOT HAVE ANY METAL ON YOUR BODY INCLUDING HAIR PINS AND PIERCING'S. DO NOT WEAR JEWELRY, MAKEUP, LOTIONS, POWDERS OR PERFUMES. DO NOT WEAR NAIL POLISH. DO NOT SHAVE 48 HRS PRIOR TO SURGERY. MEN MAY SHAVE FACE AND NECK.  DO NOT [place]. [facility] IS NOT RESPONSIBLE FOR VALUABLES.  CONTACTS, DENTURES OR PARTIALS MAY NOT BE WORN TO SURGERY. LEAVE SUITCASE IN CAR. CAN BE BROUGHT TO ROOM AFTER SURGERY.  PATIENTS DISCHARGED THE DAY OF SURGERY WILL NOT BE ALLOWED TO DRIVE HOME.  PLEASE READ OVER THE FOLLOWING INSTRUCTION SHEETS _________________________________________________________________________________                                          [facility] - PREPARING FOR SURGERY  Before surgery, you can play an important role.  Because skin is not sterile, your skin needs to be as free of germs as possible.  You can reduce the number of germs on your skin by washing with CHG (chlorahexidine gluconate) soap before surgery.  CHG is an antiseptic cleaner which kills germs and bonds with the skin to continue killing germs even after washing. Please DO NOT use if you have an allergy to CHG or antibacterial soaps.  If your skin becomes reddened/irritated stop using the CHG and inform your nurse when you arrive at Short Stay. Do not shave (including legs and underarms) for at least 48 hours prior to the first CHG shower.  You may shave your face. Please  follow these instructions carefully:   1.  Shower with CHG Soap the night before surgery and the  morning of Surgery.   2.  If you choose to wash your hair, wash your hair first as usual with your  normal  Shampoo.   3.  After you shampoo, rinse your hair and body thoroughly to remove the  shampoo.                                         4.  Use CHG as you would any other liquid soap.  You can apply chg directly  to the skin and wash . Gently wash with scrungie or clean wascloth    5.  Apply the CHG Soap to your body ONLY FROM THE NECK DOWN.   Do not use on open                           Wound or open sores. Avoid contact with eyes, ears mouth and genitals (private parts).                        Genitals (private parts) with  your normal soap.              6.  Wash thoroughly, paying special attention to the area where your surgery  will be performed.   7.  Thoroughly rinse your body with warm water from the neck down.   8.  DO NOT shower/wash with your normal soap after using and rinsing off  the CHG Soap .                9.  Pat yourself dry with a clean towel.             10.  Wear clean night clothes to bed after shower             11.  Place clean sheets on your bed the night of your first shower and do not  sleep with pets.  Day of Surgery : Do not apply any lotions/deodorants the morning of surgery.  Please wear clean clothes to the hospital/surgery center.  FAILURE TO FOLLOW THESE INSTRUCTIONS MAY RESULT IN THE CANCELLATION OF YOUR SURGERY    PATIENT SIGNATURE_________________________________  ______________________________________________________________________

## 2015-08-31 NOTE — Progress Notes (Signed)
   08/31/15 3734  OBSTRUCTIVE SLEEP APNEA  Have you ever been diagnosed with sleep apnea through a sleep study? No  Do you snore loudly (loud enough to be heard through closed doors)?  1  Do you often feel tired, fatigued, or sleepy during the daytime (such as falling asleep during driving or talking to someone)? 0  Has anyone observed you stop breathing during your sleep? 0  Do you have, or are you being treated for high blood pressure? 1  BMI more than 35 kg/m2? 1  Age > 50 (1-yes) 1  Neck circumference greater than:Male 16 inches or larger, Male 17inches or larger? 1  Male Gender (Yes=1) 1  Obstructive Sleep Apnea Score 6

## 2015-09-09 ENCOUNTER — Inpatient Hospital Stay (HOSPITAL_COMMUNITY): Payer: BLUE CROSS/BLUE SHIELD

## 2015-09-09 ENCOUNTER — Encounter (HOSPITAL_COMMUNITY)
Admission: AD | Disposition: A | Discharge status: Home Health | Payer: Self-pay | Source: Ambulatory Visit | Attending: Orthopaedic Surgery

## 2015-09-09 ENCOUNTER — Inpatient Hospital Stay (HOSPITAL_COMMUNITY): Payer: BLUE CROSS/BLUE SHIELD | Admitting: Registered Nurse

## 2015-09-09 ENCOUNTER — Inpatient Hospital Stay (HOSPITAL_COMMUNITY)
Admission: AD | Admit: 2015-09-09 | Discharge: 2015-09-10 | DRG: 470 | Disposition: A | Discharge status: Home Health | Payer: BLUE CROSS/BLUE SHIELD | Source: Ambulatory Visit | Attending: Orthopaedic Surgery | Admitting: Orthopaedic Surgery

## 2015-09-09 ENCOUNTER — Encounter (HOSPITAL_COMMUNITY): Payer: Self-pay | Admitting: *Deleted

## 2015-09-09 DIAGNOSIS — E669 Obesity, unspecified: Secondary | ICD-10-CM | POA: Diagnosis present

## 2015-09-09 DIAGNOSIS — Z01812 Encounter for preprocedural laboratory examination: Secondary | ICD-10-CM

## 2015-09-09 DIAGNOSIS — Z96641 Presence of right artificial hip joint: Secondary | ICD-10-CM

## 2015-09-09 DIAGNOSIS — Z7982 Long term (current) use of aspirin: Secondary | ICD-10-CM

## 2015-09-09 DIAGNOSIS — Z6838 Body mass index (BMI) 38.0-38.9, adult: Secondary | ICD-10-CM

## 2015-09-09 DIAGNOSIS — M1611 Unilateral primary osteoarthritis, right hip: Secondary | ICD-10-CM | POA: Diagnosis present

## 2015-09-09 DIAGNOSIS — R52 Pain, unspecified: Secondary | ICD-10-CM

## 2015-09-09 DIAGNOSIS — F419 Anxiety disorder, unspecified: Secondary | ICD-10-CM | POA: Diagnosis present

## 2015-09-09 DIAGNOSIS — I10 Essential (primary) hypertension: Secondary | ICD-10-CM | POA: Diagnosis present

## 2015-09-09 DIAGNOSIS — Z791 Long term (current) use of non-steroidal anti-inflammatories (NSAID): Secondary | ICD-10-CM | POA: Diagnosis not present

## 2015-09-09 DIAGNOSIS — Z79899 Other long term (current) drug therapy: Secondary | ICD-10-CM

## 2015-09-09 DIAGNOSIS — Z85828 Personal history of other malignant neoplasm of skin: Secondary | ICD-10-CM | POA: Diagnosis not present

## 2015-09-09 DIAGNOSIS — Z96642 Presence of left artificial hip joint: Secondary | ICD-10-CM

## 2015-09-09 DIAGNOSIS — Z981 Arthrodesis status: Secondary | ICD-10-CM | POA: Diagnosis not present

## 2015-09-09 DIAGNOSIS — Z96653 Presence of artificial knee joint, bilateral: Secondary | ICD-10-CM | POA: Diagnosis present

## 2015-09-09 DIAGNOSIS — M25551 Pain in right hip: Secondary | ICD-10-CM | POA: Diagnosis present

## 2015-09-09 HISTORY — PX: TOTAL HIP ARTHROPLASTY: SHX124

## 2015-09-09 LAB — TYPE AND SCREEN
ABO/RH(D): O POS
Antibody Screen: NEGATIVE

## 2015-09-09 SURGERY — ARTHROPLASTY, HIP, TOTAL, ANTERIOR APPROACH
Anesthesia: Spinal | Site: Hip | Laterality: Right

## 2015-09-09 MED ORDER — DIPHENHYDRAMINE HCL 12.5 MG/5ML PO ELIX: 12.5000 mg | ORAL_SOLUTION | ORAL | Status: DC | PRN

## 2015-09-09 MED ORDER — CEFAZOLIN SODIUM 1-5 GM-% IV SOLN
1.0000 g | Freq: Four times a day (QID) | INTRAVENOUS | Status: AC
  Administered 2015-09-09 (×2): 1 g via INTRAVENOUS
  Filled 2015-09-09 (×2): qty 50

## 2015-09-09 MED ORDER — DEXAMETHASONE SODIUM PHOSPHATE 10 MG/ML IJ SOLN
INTRAMUSCULAR | Status: DC | PRN
  Administered 2015-09-09: 10 mg via INTRAVENOUS

## 2015-09-09 MED ORDER — PROPOFOL 10 MG/ML IV BOLUS
INTRAVENOUS | Status: AC
  Filled 2015-09-09: qty 20

## 2015-09-09 MED ORDER — ONDANSETRON HCL 4 MG/2ML IJ SOLN: 4.0000 mg | Freq: Four times a day (QID) | INTRAMUSCULAR | Status: DC | PRN

## 2015-09-09 MED ORDER — POLYETHYLENE GLYCOL 3350 17 G PO PACK: 17.0000 g | PACK | Freq: Every day | ORAL | Status: DC | PRN

## 2015-09-09 MED ORDER — METOCLOPRAMIDE HCL 10 MG PO TABS: 5.0000 mg | ORAL_TABLET | Freq: Three times a day (TID) | ORAL | Status: DC | PRN

## 2015-09-09 MED ORDER — ONDANSETRON HCL 4 MG PO TABS: 4.0000 mg | ORAL_TABLET | Freq: Four times a day (QID) | ORAL | Status: DC | PRN

## 2015-09-09 MED ORDER — HYDROMORPHONE HCL 1 MG/ML IJ SOLN
1.0000 mg | INTRAMUSCULAR | Status: DC | PRN
  Administered 2015-09-09 (×3): 1 mg via INTRAVENOUS
  Filled 2015-09-09 (×3): qty 1

## 2015-09-09 MED ORDER — ACETAMINOPHEN 325 MG PO TABS: 650.0000 mg | ORAL_TABLET | Freq: Four times a day (QID) | ORAL | Status: DC | PRN

## 2015-09-09 MED ORDER — SODIUM CHLORIDE 0.9 % IR SOLN
Status: DC | PRN
  Administered 2015-09-09: 1000 mL

## 2015-09-09 MED ORDER — LACTATED RINGERS IV SOLN: INTRAVENOUS | Status: DC

## 2015-09-09 MED ORDER — PROPOFOL 10 MG/ML IV BOLUS
INTRAVENOUS | Status: DC | PRN
  Administered 2015-09-09: 20 mg via INTRAVENOUS

## 2015-09-09 MED ORDER — PHENOL 1.4 % MT LIQD: 1.0000 | OROMUCOSAL | Status: DC | PRN

## 2015-09-09 MED ORDER — HYDROCHLOROTHIAZIDE 25 MG PO TABS
25.0000 mg | ORAL_TABLET | Freq: Every day | ORAL | Status: DC
  Administered 2015-09-09 – 2015-09-10 (×2): 25 mg via ORAL
  Filled 2015-09-09 (×2): qty 1

## 2015-09-09 MED ORDER — METHOCARBAMOL 1000 MG/10ML IJ SOLN
500.0000 mg | Freq: Four times a day (QID) | INTRAVENOUS | Status: DC | PRN
  Administered 2015-09-09: 500 mg via INTRAVENOUS
  Filled 2015-09-09 (×2): qty 5

## 2015-09-09 MED ORDER — HYDROMORPHONE HCL 1 MG/ML IJ SOLN: 0.2500 mg | INTRAMUSCULAR | Status: DC | PRN

## 2015-09-09 MED ORDER — METOCLOPRAMIDE HCL 5 MG/ML IJ SOLN: 5.0000 mg | Freq: Three times a day (TID) | INTRAMUSCULAR | Status: DC | PRN

## 2015-09-09 MED ORDER — ALPRAZOLAM 1 MG PO TABS
1.0000 mg | ORAL_TABLET | Freq: Every evening | ORAL | Status: DC | PRN
  Administered 2015-09-09: 1 mg via ORAL
  Filled 2015-09-09: qty 1

## 2015-09-09 MED ORDER — ONDANSETRON HCL 4 MG/2ML IJ SOLN
INTRAMUSCULAR | Status: AC
  Filled 2015-09-09: qty 2

## 2015-09-09 MED ORDER — PROPOFOL 500 MG/50ML IV EMUL
INTRAVENOUS | Status: DC | PRN
  Administered 2015-09-09: 100 ug/kg/min via INTRAVENOUS

## 2015-09-09 MED ORDER — ACETAMINOPHEN 10 MG/ML IV SOLN
INTRAVENOUS | Status: AC
  Filled 2015-09-09: qty 100

## 2015-09-09 MED ORDER — METHOCARBAMOL 500 MG PO TABS
500.0000 mg | ORAL_TABLET | Freq: Four times a day (QID) | ORAL | Status: DC | PRN
  Administered 2015-09-09 – 2015-09-10 (×2): 500 mg via ORAL
  Filled 2015-09-09 (×2): qty 1

## 2015-09-09 MED ORDER — CELECOXIB 200 MG PO CAPS
200.0000 mg | ORAL_CAPSULE | Freq: Two times a day (BID) | ORAL | Status: DC
  Administered 2015-09-09 – 2015-09-10 (×2): 200 mg via ORAL
  Filled 2015-09-09 (×3): qty 1

## 2015-09-09 MED ORDER — PROMETHAZINE HCL 25 MG/ML IJ SOLN: 6.2500 mg | INTRAMUSCULAR | Status: DC | PRN

## 2015-09-09 MED ORDER — MENTHOL 3 MG MT LOZG: 1.0000 | LOZENGE | OROMUCOSAL | Status: DC | PRN

## 2015-09-09 MED ORDER — TRANEXAMIC ACID 1000 MG/10ML IV SOLN
1000.0000 mg | INTRAVENOUS | Status: AC
  Administered 2015-09-09: 1000 mg via INTRAVENOUS
  Filled 2015-09-09: qty 10

## 2015-09-09 MED ORDER — CEFAZOLIN SODIUM-DEXTROSE 2-3 GM-% IV SOLR
INTRAVENOUS | Status: AC
  Filled 2015-09-09: qty 50

## 2015-09-09 MED ORDER — FENTANYL CITRATE (PF) 100 MCG/2ML IJ SOLN
INTRAMUSCULAR | Status: AC
  Filled 2015-09-09: qty 4

## 2015-09-09 MED ORDER — SORBITOL 70 % SOLN
30.0000 mL | Freq: Every day | Status: DC | PRN
  Filled 2015-09-09: qty 30

## 2015-09-09 MED ORDER — BUPIVACAINE HCL (PF) 0.5 % IJ SOLN
INTRAMUSCULAR | Status: DC | PRN
  Administered 2015-09-09: 3 mL

## 2015-09-09 MED ORDER — ACETAMINOPHEN 650 MG RE SUPP: 650.0000 mg | Freq: Four times a day (QID) | RECTAL | Status: DC | PRN

## 2015-09-09 MED ORDER — EPHEDRINE SULFATE 50 MG/ML IJ SOLN
INTRAMUSCULAR | Status: DC | PRN
  Administered 2015-09-09: 5 mg via INTRAVENOUS
  Administered 2015-09-09: 10 mg via INTRAVENOUS
  Administered 2015-09-09: 5 mg via INTRAVENOUS

## 2015-09-09 MED ORDER — PHENYLEPHRINE HCL 10 MG/ML IJ SOLN
20.0000 mg | INTRAVENOUS | Status: DC | PRN
  Administered 2015-09-09: 50 ug/min via INTRAVENOUS

## 2015-09-09 MED ORDER — OXYCODONE HCL 5 MG PO TABS
5.0000 mg | ORAL_TABLET | ORAL | Status: DC | PRN
  Administered 2015-09-09: 10 mg via ORAL
  Administered 2015-09-09 (×2): 15 mg via ORAL
  Administered 2015-09-10: 10 mg via ORAL
  Administered 2015-09-10: 15 mg via ORAL
  Filled 2015-09-09 (×3): qty 3
  Filled 2015-09-09 (×2): qty 2

## 2015-09-09 MED ORDER — MEPERIDINE HCL 50 MG/ML IJ SOLN: 6.2500 mg | INTRAMUSCULAR | Status: DC | PRN

## 2015-09-09 MED ORDER — ONDANSETRON HCL 4 MG/2ML IJ SOLN
INTRAMUSCULAR | Status: DC | PRN
  Administered 2015-09-09: 4 mg via INTRAVENOUS

## 2015-09-09 MED ORDER — SODIUM CHLORIDE 0.9 % IV SOLN
INTRAVENOUS | Status: DC
  Administered 2015-09-09: 12:00:00 via INTRAVENOUS

## 2015-09-09 MED ORDER — DOCUSATE SODIUM 100 MG PO CAPS
100.0000 mg | ORAL_CAPSULE | Freq: Two times a day (BID) | ORAL | Status: DC
Start: 2015-09-09 — End: 2015-09-10
  Administered 2015-09-09 – 2015-09-10 (×2): 100 mg via ORAL

## 2015-09-09 MED ORDER — NITROGLYCERIN 0.4 MG SL SUBL: 0.4000 mg | SUBLINGUAL_TABLET | SUBLINGUAL | Status: DC | PRN

## 2015-09-09 MED ORDER — CEFAZOLIN SODIUM-DEXTROSE 2-3 GM-% IV SOLR
2.0000 g | INTRAVENOUS | Status: AC
  Administered 2015-09-09: 2 g via INTRAVENOUS

## 2015-09-09 MED ORDER — FENTANYL CITRATE (PF) 100 MCG/2ML IJ SOLN
INTRAMUSCULAR | Status: DC | PRN
  Administered 2015-09-09: 100 ug via INTRAVENOUS

## 2015-09-09 MED ORDER — MIDAZOLAM HCL 2 MG/2ML IJ SOLN
INTRAMUSCULAR | Status: AC
  Filled 2015-09-09: qty 4

## 2015-09-09 MED ORDER — PHENYLEPHRINE HCL 10 MG/ML IJ SOLN
INTRAMUSCULAR | Status: AC
  Filled 2015-09-09: qty 1

## 2015-09-09 MED ORDER — GLYCOPYRROLATE 0.2 MG/ML IJ SOLN
INTRAMUSCULAR | Status: DC | PRN
  Administered 2015-09-09: 0.2 mg via INTRAVENOUS

## 2015-09-09 MED ORDER — LACTATED RINGERS IV SOLN
INTRAVENOUS | Status: DC | PRN
  Administered 2015-09-09 (×3): via INTRAVENOUS

## 2015-09-09 MED ORDER — DEXAMETHASONE SODIUM PHOSPHATE 10 MG/ML IJ SOLN
INTRAMUSCULAR | Status: AC
  Filled 2015-09-09: qty 1

## 2015-09-09 MED ORDER — ACETAMINOPHEN 10 MG/ML IV SOLN
INTRAVENOUS | Status: DC | PRN
  Administered 2015-09-09: 1000 mg via INTRAVENOUS

## 2015-09-09 MED ORDER — ASPIRIN EC 325 MG PO TBEC
325.0000 mg | DELAYED_RELEASE_TABLET | Freq: Two times a day (BID) | ORAL | Status: DC
  Administered 2015-09-10: 325 mg via ORAL
  Filled 2015-09-09 (×3): qty 1

## 2015-09-09 MED ORDER — PHENYLEPHRINE HCL 10 MG/ML IJ SOLN
INTRAMUSCULAR | Status: DC | PRN
  Administered 2015-09-09 (×2): 160 ug via INTRAVENOUS
  Administered 2015-09-09: 80 ug via INTRAVENOUS
  Administered 2015-09-09: 120 ug via INTRAVENOUS

## 2015-09-09 MED ORDER — ALUM & MAG HYDROXIDE-SIMETH 200-200-20 MG/5ML PO SUSP: 30.0000 mL | ORAL | Status: DC | PRN

## 2015-09-09 MED ORDER — MIDAZOLAM HCL 5 MG/5ML IJ SOLN
INTRAMUSCULAR | Status: DC | PRN
  Administered 2015-09-09: 2 mg via INTRAVENOUS

## 2015-09-09 SURGICAL SUPPLY — 34 items
BAG ZIPLOCK 12X15 (MISCELLANEOUS) IMPLANT
BENZOIN TINCTURE PRP APPL 2/3 (GAUZE/BANDAGES/DRESSINGS) IMPLANT
BLADE SAW SGTL 18X1.27X75 (BLADE) ×2 IMPLANT
CAPT HIP TOTAL 2 ×2 IMPLANT
CELLS DAT CNTRL 66122 CELL SVR (MISCELLANEOUS) ×1 IMPLANT
CLOTH BEACON ORANGE TIMEOUT ST (SAFETY) ×2 IMPLANT
DRAPE STERI IOBAN 125X83 (DRAPES) ×2 IMPLANT
DRAPE U-SHAPE 47X51 STRL (DRAPES) ×4 IMPLANT
DRSG AQUACEL AG ADV 3.5X10 (GAUZE/BANDAGES/DRESSINGS) ×2 IMPLANT
DURAPREP 26ML APPLICATOR (WOUND CARE) ×2 IMPLANT
ELECT REM PT RETURN 9FT ADLT (ELECTROSURGICAL) ×2
ELECTRODE REM PT RTRN 9FT ADLT (ELECTROSURGICAL) ×1 IMPLANT
GAUZE XEROFORM 1X8 LF (GAUZE/BANDAGES/DRESSINGS) ×2 IMPLANT
GLOVE BIO SURGEON STRL SZ7.5 (GLOVE) ×2 IMPLANT
GLOVE BIOGEL PI IND STRL 8 (GLOVE) ×2 IMPLANT
GLOVE BIOGEL PI INDICATOR 8 (GLOVE) ×2
GLOVE ECLIPSE 8.0 STRL XLNG CF (GLOVE) ×2 IMPLANT
GOWN STRL REUS W/TWL XL LVL3 (GOWN DISPOSABLE) ×4 IMPLANT
HANDPIECE INTERPULSE COAX TIP (DISPOSABLE) ×1
HOLDER FOLEY CATH W/STRAP (MISCELLANEOUS) ×2 IMPLANT
PACK ANTERIOR HIP CUSTOM (KITS) ×2 IMPLANT
RTRCTR WOUND ALEXIS 18CM MED (MISCELLANEOUS) ×2
SET HNDPC FAN SPRY TIP SCT (DISPOSABLE) ×1 IMPLANT
SPONGE LAP 18X18 X RAY DECT (DISPOSABLE) ×2 IMPLANT
STAPLER VISISTAT 35W (STAPLE) ×2 IMPLANT
STRIP CLOSURE SKIN 1/2X4 (GAUZE/BANDAGES/DRESSINGS) IMPLANT
SUT ETHIBOND NAB CT1 #1 30IN (SUTURE) ×2 IMPLANT
SUT MNCRL AB 4-0 PS2 18 (SUTURE) IMPLANT
SUT VIC AB 0 CT1 36 (SUTURE) ×2 IMPLANT
SUT VIC AB 1 CT1 36 (SUTURE) ×2 IMPLANT
SUT VIC AB 2-0 CT1 27 (SUTURE) ×2
SUT VIC AB 2-0 CT1 TAPERPNT 27 (SUTURE) ×2 IMPLANT
TRAY FOLEY W/METER SILVER 14FR (SET/KITS/TRAYS/PACK) IMPLANT
TRAY FOLEY W/METER SILVER 16FR (SET/KITS/TRAYS/PACK) ×2 IMPLANT

## 2015-09-09 NOTE — Evaluation (Signed)
Physical Therapy Evaluation Patient Details Name: Matthew [name] MRN: [id number] DOB: 1956-01-12 Today's Date: 09/09/2015   History of Present Illness  R DATHA  Clinical Impression  Pt admitted with above diagnosis. Pt currently with functional limitations due to the deficits listed below (see PT Problem List). Pt will benefit from skilled PT to increase their independence and safety with mobility to allow discharge to the venue listed below.   Patient tolerated standing, still a little numb so did not ambulate.     Follow Up Recommendations Home health PT;Supervision - Intermittent    Equipment Recommendations  None recommended by PT    Recommendations for Other Services       Precautions / Restrictions Precautions Precautions: Fall      Mobility  Bed Mobility Overal bed mobility: Needs Assistance Bed Mobility: Supine to Sit;Sit to Supine     Supine to sit: Min guard Sit to supine: Min assist   General bed mobility comments: assist with r leg onto bed  Transfers Overall transfer level: Needs assistance Equipment used: Rolling walker (2 wheeled) Transfers: Sit to/from Stand Sit to Stand: Min assist;+2 safety/equipment;From elevated surface         General transfer comment: still feels some numbness in legs, only took  4 side steps   Ambulation/Gait                Stairs            Wheelchair Mobility    Modified Rankin (Stroke Patients Only)       Balance                                             Pertinent Vitals/Pain Pain Assessment: No/denies pain    Home Living Family/patient expects to be discharged to:: Private residence Living Arrangements: Parent Available Help at Discharge: Family Type of Home: House Home Access: Stairs to enter   Technical brewer of Steps: 4 Home Layout: One level Home Equipment: Environmental consultant - 2 wheels Additional Comments: to mother's house    Prior Function Level of  Independence: Independent               Hand Dominance        Extremity/Trunk Assessment   Upper Extremity Assessment: Overall WFL for tasks assessed           Lower Extremity Assessment: RLE deficits/detail RLE Deficits / Details: able to flex the hip and knee in supine    Cervical / Trunk Assessment: Normal  Communication   Communication: No difficulties  Cognition Arousal/Alertness: Awake/alert Behavior During Therapy: WFL for tasks assessed/performed Overall Cognitive Status: Within Functional Limits for tasks assessed                      General Comments      Exercises        Assessment/Plan    PT Assessment Patient needs continued PT services  PT Diagnosis Difficulty walking   PT Problem List Decreased strength;Decreased activity tolerance;Decreased range of motion;Decreased mobility;Decreased safety awareness  PT Treatment Interventions DME instruction;Gait training;Stair training;Functional mobility training;Therapeutic activities;Therapeutic exercise;Patient/family education   PT Goals (Current goals can be found in the Care Plan section) Acute Rehab PT Goals Patient Stated Goal: to go home tomorrow. PT Goal Formulation: With patient/family Time For Goal Achievement: 09/12/15 Potential to Achieve Goals: Good  Frequency 7X/week   Barriers to discharge        Co-evaluation               End of Session   Activity Tolerance: Patient tolerated treatment well Patient left: in bed;with call bell/phone within reach;with bed alarm set;with family/visitor present Nurse Communication: Mobility status         Time: 0630-1601 PT Time Calculation (min) (ACUTE ONLY): 25 min   Charges:   PT Evaluation $Initial PT Evaluation Tier I: 1 Procedure PT Treatments $Gait Training: 8-22 mins   PT G Codes:        Claretha Cooper 09/09/2015, 4:32 PM

## 2015-09-09 NOTE — H&P (Signed)
TOTAL HIP ADMISSION H&P  Patient is admitted for right total hip arthroplasty.  Subjective:  Chief Complaint: right hip pain  HPI: Matthew [name], 59 y.o. male, has a history of pain and functional disability in the right hip(s) due to arthritis and patient has failed non-surgical conservative treatments for greater than 12 weeks to include NSAID's and/or analgesics, corticosteriod injections, flexibility and strengthening excercises, weight reduction as appropriate and activity modification.  Onset of symptoms was abrupt starting 1 years ago with gradually worsening course since that time.The patient noted no past surgery on the right hip(s).  Patient currently rates pain in the right hip at 10 out of 10 with activity. Patient has night pain, worsening of pain with activity and weight bearing, pain that interfers with activities of daily living and pain with passive range of motion. Patient has evidence of subchondral sclerosis, periarticular osteophytes and joint space narrowing by imaging studies. This condition presents safety issues increasing the risk of falls.  There is no current active infection.  Patient Active Problem List   Diagnosis Date Noted  . Osteoarthritis of right hip 09/09/2015  . Chest pain 02/05/2014  . Palpitations 02/05/2014  . Dyspnea 02/05/2014  . Dehydration 02/05/2014   Past Medical History  Diagnosis Date  . Hypertension     FOLLOWED BY DR ED GREEN  . Arthritis     KNEES BACK SHOULDERS  . Hx of nonmelanoma skin cancer   . Anxiety   . Cancer [facility])     HX SKIN CANCER    Past Surgical History  Procedure Laterality Date  . Knee arthroscopy      L X 2  . Knee arthroplasty  2010    L  . Back surgery  2010    L5S1 FUSION  . Total knee arthroplasty  10/08/2011    Procedure: TOTAL KNEE ARTHROPLASTY;  Surgeon: Rudean Haskell, MD;  Location: [place];  Service: Orthopedics;  Laterality: Right;  TOTAL RIGHT KNEE ARTHROPLASTY     Prescriptions prior to  admission  Medication Sig Dispense Refill Last Dose  . ALPRAZolam (XANAX) 1 MG tablet Take 1 mg by mouth at bedtime as needed for sleep.    09/09/2015 at 0500  . celecoxib (CELEBREX) 200 MG capsule Take 200 mg by mouth daily.   09/08/2015 at pm  . hydrochlorothiazide (HYDRODIURIL) 25 MG tablet Take 25 mg by mouth daily.   09/08/2015 at pm  . lisinopril (PRINIVIL,ZESTRIL) 20 MG tablet Take 20 mg by mouth daily.     09/08/2015 at pm  . nitroGLYCERIN (NITROSTAT) 0.4 MG SL tablet Place 1 tablet (0.4 mg total) under the tongue every 5 (five) minutes as needed for chest pain. 30 tablet 4 2 yr  . oxycodone (OXY-IR) 5 MG capsule Take 5 mg by mouth every 4 (four) hours as needed.   2 wk  . aspirin EC 81 MG tablet Take 1 tablet (81 mg total) by mouth daily. (Patient not taking: Reported on 08/25/2015) 30 tablet 3   . traMADol (ULTRAM) 50 MG tablet Take 1 tablet (50 mg total) by mouth every 12 (twelve) hours as needed for severe pain. (Patient not taking: Reported on 08/25/2015) 30 tablet 0    No Known Allergies  Social History  Substance Use Topics  . Smoking status: Never Smoker   . Smokeless tobacco: Never Used  . Alcohol Use: Yes     Comment: OCCASIONAL    Family History  Problem Relation Age of Onset  . Heart attack  Brother     24  . Heart attack Paternal Grandfather     5 bypass surgeries  . Cancer Father     died of bladder cancer      Review of Systems  Musculoskeletal: Positive for back pain and joint pain.    Objective:  Physical Exam  Constitutional: He is oriented to person, place, and time. He appears well-developed and well-nourished.  HENT:  Head: Normocephalic and atraumatic.  Eyes: EOM are normal. Pupils are equal, round, and reactive to light.  Neck: Normal range of motion. Neck supple.  Cardiovascular: Normal rate and regular rhythm.   Respiratory: Effort normal and breath sounds normal.  GI: Soft. Bowel sounds are normal.  Musculoskeletal:       Right hip: He  exhibits decreased range of motion, decreased strength, tenderness and bony tenderness.  Neurological: He is alert and oriented to person, place, and time.  Skin: Skin is warm and dry.  Psychiatric: He has a normal mood and affect.    Vital signs in last 24 hours: Temp:  [98.3 F (36.8 C)] 98.3 F (36.8 C) (10/28 0535) Pulse Rate:  [85] 85 (10/28 0535) Resp:  [16] 16 (10/28 0535) BP: (137)/(93) 137/93 mmHg (10/28 0535) SpO2:  [97 %] 97 % (10/28 0535) Weight:  [110.224 kg (243 lb)] 110.224 kg (243 lb) (10/28 0605)  Labs:   Estimated body mass index is 38.05 kg/(m^2) as calculated from the following:   Height as of this encounter: 5\' 7"  (1.702 m).   Weight as of this encounter: 110.224 kg (243 lb).   Imaging Review Plain radiographs demonstrate severe degenerative joint disease of the right hip(s). The bone quality appears to be excellent for age and reported activity level.  Assessment/Plan:  End stage arthritis, right hip(s)  The patient history, physical examination, clinical judgement of the provider and imaging studies are consistent with end stage degenerative joint disease of the right hip(s) and total hip arthroplasty is deemed medically necessary. The treatment options including medical management, injection therapy, arthroscopy and arthroplasty were discussed at length. The risks and benefits of total hip arthroplasty were presented and reviewed. The risks due to aseptic loosening, infection, stiffness, dislocation/subluxation,  thromboembolic complications and other imponderables were discussed.  The patient acknowledged the explanation, agreed to proceed with the plan and consent was signed. Patient is being admitted for inpatient treatment for surgery, pain control, PT, OT, prophylactic antibiotics, VTE prophylaxis, progressive ambulation and ADL's and discharge planning.The patient is planning to be discharged home with home health services

## 2015-09-09 NOTE — Anesthesia Procedure Notes (Signed)
Spinal Patient location during procedure: OR End time: 09/09/2015 7:28 AM Staffing Performed by: anesthesiologist  Preanesthetic Checklist Completed: patient identified, site marked, surgical consent, pre-op evaluation, timeout performed, IV checked, risks and benefits discussed and monitors and equipment checked Spinal Block Patient position: sitting Prep: Betadine Patient monitoring: heart rate, continuous pulse ox and blood pressure Location: L3-4 Injection technique: single-shot Needle Needle type: Sprotte  Needle gauge: 24 G Needle length: 9 cm Assessment Sensory level: T4 Additional Notes Expiration date of kit checked and confirmed. Patient tolerated procedure well, without complications.

## 2015-09-09 NOTE — Brief Op Note (Signed)
09/09/2015  8:59 AM  PATIENT:  Matthew [name]  59 y.o. male  PRE-OPERATIVE DIAGNOSIS:  Severe arthritis right hip  POST-OPERATIVE DIAGNOSIS:  Severe arthritis right hip  PROCEDURE:  Procedure(s): RIGHT TOTAL HIP ARTHROPLASTY ANTERIOR APPROACH (Right)  SURGEON:  Surgeon(s) and Role:    * Mcarthur Rossetti, MD - Primary  PHYSICIAN ASSISTANT: Benita Stabile, PA-C  ANESTHESIA:   spinal  EBL:  Total I/O In: 2000 [I.V.:2000] Out: 600 [Urine:100; Blood:500]  BLOOD ADMINISTERED:none  DRAINS: none   LOCAL MEDICATIONS USED:  NONE  SPECIMEN:  No Specimen  DISPOSITION OF SPECIMEN:  N/A  COUNTS:  YES  TOURNIQUET:  * No tourniquets in log *  DICTATION: .Other Dictation: Dictation Number [id number]  PLAN OF CARE: Admit to inpatient   PATIENT DISPOSITION:  PACU - hemodynamically stable.   Delay start of Pharmacological VTE agent (>24hrs) due to surgical blood loss or risk of bleeding: no

## 2015-09-09 NOTE — Anesthesia Preprocedure Evaluation (Addendum)
Anesthesia Evaluation  Patient identified by MRN, date of birth, ID band Patient awake    Reviewed: Allergy & Precautions, NPO status , Patient's Chart, lab work & pertinent test results  Airway Mallampati: II  TM Distance: >3 FB Neck ROM: Full    Dental no notable dental hx.    Pulmonary neg pulmonary ROS,    Pulmonary exam normal breath sounds clear to auscultation       Cardiovascular hypertension, Pt. on medications Normal cardiovascular exam Rhythm:Regular Rate:Normal     Neuro/Psych negative neurological ROS  negative psych ROS   GI/Hepatic negative GI ROS, Neg liver ROS,   Endo/Other  negative endocrine ROS  Renal/GU negative Renal ROS  negative genitourinary   Musculoskeletal negative musculoskeletal ROS (+)   Abdominal   Peds negative pediatric ROS (+)  Hematology negative hematology ROS (+)   Anesthesia Other Findings   Reproductive/Obstetrics negative OB ROS                            Anesthesia Physical Anesthesia Plan  ASA: II  Anesthesia Plan: Spinal   Post-op Pain Management:    Induction:   Airway Management Planned: Simple Face Mask  Additional Equipment:   Intra-op Plan:   Post-operative Plan:   Informed Consent: I have reviewed the patients History and Physical, chart, labs and discussed the procedure including the risks, benefits and alternatives for the proposed anesthesia with the patient or authorized representative who has indicated his/her understanding and acceptance.   Dental advisory given  Plan Discussed with: CRNA  Anesthesia Plan Comments:         Anesthesia Quick Evaluation

## 2015-09-09 NOTE — Anesthesia Postprocedure Evaluation (Signed)
  Anesthesia Post-op Note  Patient: Matthew [name]  Procedure(s) Performed: Procedure(s) (LRB): RIGHT TOTAL HIP ARTHROPLASTY ANTERIOR APPROACH (Right)  Patient Location: PACU  Anesthesia Type: Spinal  Level of Consciousness: awake and alert   Airway and Oxygen Therapy: Patient Spontanous Breathing  Post-op Pain: mild  Post-op Assessment: Post-op Vital signs reviewed, Patient's Cardiovascular Status Stable, Respiratory Function Stable, Patent Airway and No signs of Nausea or vomiting  Last Vitals:  Filed Vitals:   09/09/15 1245  BP: 102/72  Pulse: 93  Temp: 36.9 C  Resp: 16    Post-op Vital Signs: stable   Complications: No apparent anesthesia complications

## 2015-09-09 NOTE — Transfer of Care (Signed)
Immediate Anesthesia Transfer of Care Note  Patient: Matthew [name]  Procedure(s) Performed: Procedure(s): RIGHT TOTAL HIP ARTHROPLASTY ANTERIOR APPROACH (Right)  Patient Location: PACU  Anesthesia Type:Spinal  Level of Consciousness: sedated and patient cooperative  Airway & Oxygen Therapy: Patient Spontanous Breathing and Patient connected to face mask oxygen  Post-op Assessment: Report given to RN and Post -op Vital signs reviewed and stable  Post vital signs: stable  Last Vitals:  Filed Vitals:   09/09/15 0535  BP: 137/93  Pulse: 85  Temp: 36.8 C  Resp: 16    Complications: No apparent anesthesia complications

## 2015-09-09 NOTE — Progress Notes (Signed)
Small scrape on Rt anterior hip from clippers during prep. Note posted on front of chart to inform Dr. Ninfa Linden

## 2015-09-10 LAB — CBC
HCT: 38 % — ABNORMAL LOW (ref 39.0–52.0)
Hemoglobin: 12.7 g/dL — ABNORMAL LOW (ref 13.0–17.0)
MCH: 31 pg (ref 26.0–34.0)
MCHC: 33.4 g/dL (ref 30.0–36.0)
MCV: 92.7 fL (ref 78.0–100.0)
Platelets: 203 10*3/uL (ref 150–400)
RBC: 4.1 MIL/uL — ABNORMAL LOW (ref 4.22–5.81)
RDW: 13.1 % (ref 11.5–15.5)
WBC: 9.4 10*3/uL (ref 4.0–10.5)

## 2015-09-10 LAB — BASIC METABOLIC PANEL
Anion gap: 6 (ref 5–15)
BUN: 14 mg/dL (ref 6–20)
CO2: 27 mmol/L (ref 22–32)
Calcium: 8.9 mg/dL (ref 8.9–10.3)
Chloride: 103 mmol/L (ref 101–111)
Creatinine, Ser: 0.95 mg/dL (ref 0.61–1.24)
GFR calc Af Amer: >60 mL/min (ref >60)
GFR calc non Af Amer: >60 mL/min (ref >60)
Glucose, Bld: 143 mg/dL — ABNORMAL HIGH (ref 65–99)
Potassium: 4.1 mmol/L (ref 3.5–5.1)
Sodium: 136 mmol/L (ref 135–145)

## 2015-09-10 MED ORDER — OXYCODONE-ACETAMINOPHEN 5-325 MG PO TABS: 1.0000 | ORAL_TABLET | ORAL | Status: DC | PRN

## 2015-09-10 MED ORDER — METHOCARBAMOL 500 MG PO TABS: 500.0000 mg | ORAL_TABLET | Freq: Four times a day (QID) | ORAL | Status: DC | PRN

## 2015-09-10 MED ORDER — ASPIRIN 325 MG PO TBEC: 325.0000 mg | DELAYED_RELEASE_TABLET | Freq: Two times a day (BID) | ORAL | Status: DC

## 2015-09-10 NOTE — Discharge Instructions (Signed)

## 2015-09-10 NOTE — Progress Notes (Signed)
Physical Therapy Treatment Patient Details Name: Matthew [name] MRN: [id number] DOB: 1956-03-22 Today's Date: 09/10/2015    History of Present Illness 59 yo male s/p R THA-direct anterior 09/09/15    PT Comments    Progressing well with mobility. Will plan to have a 2nd session to practice steps.   Follow Up Recommendations  Home health PT;Supervision - Intermittent     Equipment Recommendations  None recommended by PT    Recommendations for Other Services       Precautions / Restrictions Precautions Precautions: Fall Restrictions Weight Bearing Restrictions: No RLE Weight Bearing: Weight bearing as tolerated    Mobility  Bed Mobility Overal bed mobility: Needs Assistance Bed Mobility: Supine to Sit;Sit to Supine     Supine to sit: Min assist Sit to supine: Min assist   General bed mobility comments: small amount of assist for R LE  Transfers Overall transfer level: Needs assistance Equipment used: Rolling walker (2 wheeled) Transfers: Sit to/from Stand Sit to Stand: Min guard         General transfer comment: close guard for safety. VCs safety, technique, hand placement  Ambulation/Gait Ambulation/Gait assistance: Min guard Ambulation Distance (Feet): 175 Feet Assistive device: Rolling walker (2 wheeled) Gait Pattern/deviations: Step-to pattern;Step-through pattern     General Gait Details: close guard for safety.    Stairs            Wheelchair Mobility    Modified Rankin (Stroke Patients Only)       Balance                                    Cognition Arousal/Alertness: Awake/alert Behavior During Therapy: WFL for tasks assessed/performed Overall Cognitive Status: Within Functional Limits for tasks assessed                      Exercises Total Joint Exercises Ankle Circles/Pumps: AROM;Both;10 reps;Supine Quad Sets: AROM;Both;10 reps;Supine Heel Slides: AAROM;Left;10 reps;Supine Hip  ABduction/ADduction: AAROM;Left;10 reps;Supine    General Comments        Pertinent Vitals/Pain Pain Assessment: 0-10 Pain Location: R hip Pain Descriptors / Indicators: Burning Pain Intervention(s): Monitored during session;Ice applied;Repositioned    Home Living                      Prior Function            PT Goals (current goals can now be found in the care plan section) Progress towards PT goals: Progressing toward goals    Frequency  7X/week    PT Plan Current plan remains appropriate    Co-evaluation             End of Session   Activity Tolerance: Patient tolerated treatment well Patient left: in bed;with call bell/phone within reach     Time: 0911-0936 PT Time Calculation (min) (ACUTE ONLY): 25 min  Charges:  $Gait Training: 8-22 mins $Therapeutic Exercise: 8-22 mins                    G Codes:      Weston Anna, MPT Pager: [phone]

## 2015-09-10 NOTE — Progress Notes (Signed)
OT Cancellation Note  Patient Details Name: Matthew [name] MRN: [id number] DOB: 08/25/1956   Cancelled Treatment:    Reason Eval/Treat Not Completed: OT screened, no needs identified, will sign off. Spoke with pt who states he does not feel he needs OT at this time. He states he has AE for LB self care, a walk in shower with a seat at his mom's house and states he doesn't feel he will need a 3in1. He states he has had multiple orthopedic surgeries in the past and feels comfortable with ADL techniques. Will sign off per pt request.  Jules Schick  [phone] 09/10/2015, 10:15 AM

## 2015-09-10 NOTE — Discharge Summary (Signed)
Patient ID: Matthew [name] MRN: [id number] DOB/AGE: [birth year] 59 y.o.  Admit date: 09/09/2015 Discharge date: 09/10/2015  Admission Diagnoses:  Principal Problem:   Osteoarthritis of right hip Active Problems:   Status post total replacement of right hip   Discharge Diagnoses:  Same  Past Medical History  Diagnosis Date  . Hypertension     FOLLOWED BY DR ED GREEN  . Arthritis     KNEES BACK SHOULDERS  . Hx of nonmelanoma skin cancer   . Anxiety   . Cancer ([place])     HX SKIN CANCER    Surgeries: Procedure(s): RIGHT TOTAL HIP ARTHROPLASTY ANTERIOR APPROACH on 09/09/2015   Consultants:    Discharged Condition: Improved  Hospital Course: Matthew [name] is an 59 y.o. male who was admitted 09/09/2015 for operative treatment ofOsteoarthritis of right hip. Patient has severe unremitting pain that affects sleep, daily activities, and work/hobbies. After pre-op clearance the patient was taken to the operating room on 09/09/2015 and underwent  Procedure(s): RIGHT TOTAL HIP ARTHROPLASTY ANTERIOR APPROACH.    Patient was given perioperative antibiotics: Anti-infectives    Start     Dose/Rate Route Frequency Ordered Stop   09/09/15 1400  ceFAZolin (ANCEF) IVPB 1 g/50 mL premix     1 g 100 mL/hr over 30 Minutes Intravenous Every 6 hours 09/09/15 1201 09/09/15 2025   09/09/15 0546  ceFAZolin (ANCEF) IVPB 2 g/50 mL premix     2 g 100 mL/hr over 30 Minutes Intravenous On call to O.R. 09/09/15 8921 09/09/15 1941       Patient was given sequential compression devices, early ambulation, and chemoprophylaxis to prevent DVT.  Patient benefited maximally from hospital stay and there were no complications.    Recent vital signs: Patient Vitals for the past 24 hrs:  BP Temp Temp src Pulse Resp SpO2 Height Weight  09/10/15 0942 (!) 122/92 mmHg 98.2 F (36.8 C) Oral 83 18 100 % - -  09/10/15 0730 127/75 mmHg 97.5 F (36.4 C) Oral 83 18 99 % - -  09/10/15 0636 - - - - - 98 % - -   09/10/15 0442 134/90 mmHg 97.7 F (36.5 C) Oral 89 18 99 % - -  09/10/15 0231 117/80 mmHg 97.7 F (36.5 C) Oral 90 18 98 % - -  09/09/15 2141 120/80 mmHg 98.5 F (36.9 C) Oral 89 19 95 % - -  09/09/15 1800 135/88 mmHg 98.3 F (36.8 C) Oral 93 18 95 % - -  09/09/15 1450 110/71 mmHg 98.2 F (36.8 C) Oral 89 18 97 % - -  09/09/15 1339 107/84 mmHg 98 F (36.7 C) Oral 96 18 95 % - -  09/09/15 1300 - - - - - - 5\' 7"  (1.702 m) 108.863 kg (240 lb)  09/09/15 1245 102/72 mmHg 98.4 F (36.9 C) Oral 93 16 96 % - -  09/09/15 1139 107/65 mmHg 97.8 F (36.6 C) - 86 16 93 % - -  09/09/15 1115 107/64 mmHg 97.5 F (36.4 C) - - - - - -  09/09/15 1100 123/84 mmHg - - 79 16 100 % - -     Recent laboratory studies:  Recent Labs  09/10/15 0432  WBC 9.4  HGB 12.7*  HCT 38.0*  PLT 203  NA 136  K 4.1  CL 103  CO2 27  BUN 14  CREATININE 0.95  GLUCOSE 143*  CALCIUM 8.9     Discharge Medications:     Medication List  TAKE these medications        ALPRAZolam 1 MG tablet  Commonly known as:  XANAX  Take 1 mg by mouth at bedtime as needed for sleep.     aspirin 325 MG EC tablet  Take 1 tablet (325 mg total) by mouth 2 (two) times daily after a meal.     celecoxib 200 MG capsule  Commonly known as:  CELEBREX  Take 200 mg by mouth daily.     hydrochlorothiazide 25 MG tablet  Commonly known as:  HYDRODIURIL  Take 25 mg by mouth daily.     lisinopril 20 MG tablet  Commonly known as:  PRINIVIL,ZESTRIL  Take 20 mg by mouth daily.     methocarbamol 500 MG tablet  Commonly known as:  ROBAXIN  Take 1 tablet (500 mg total) by mouth every 6 (six) hours as needed for muscle spasms.     nitroGLYCERIN 0.4 MG SL tablet  Commonly known as:  NITROSTAT  Place 1 tablet (0.4 mg total) under the tongue every 5 (five) minutes as needed for chest pain.     oxycodone 5 MG capsule  Commonly known as:  OXY-IR  Take 5 mg by mouth every 4 (four) hours as needed.     oxyCODONE-acetaminophen  5-325 MG tablet  Commonly known as:  ROXICET  Take 1-2 tablets by mouth every 4 (four) hours as needed.     traMADol 50 MG tablet  Commonly known as:  ULTRAM  Take 1 tablet (50 mg total) by mouth every 12 (twelve) hours as needed for severe pain.        Diagnostic Studies: Dg C-arm 1-60 Min-no Report  09/09/2015  CLINICAL DATA: pain C-ARM 1-60 MINUTES Fluoroscopy was utilized by the requesting physician.  No radiographic interpretation.   Dg Hip Port Unilat With Pelvis 1v Right  09/09/2015  CLINICAL DATA:  Postop replacement. EXAM: DG HIP (WITH OR WITHOUT PELVIS) 1V PORT RIGHT COMPARISON:  Intraoperative films earlier same day. FINDINGS: Examination demonstrates evidence of patient's total right hip arthroplasty with prosthetic components intact and normally located. There are skin staples over the lateral soft tissues of the right hip. Mild degenerative change of the left hip. Fusion hardware is present over the L5-S1 level. IMPRESSION: Right total hip arthroplasty without complicating features. Electronically Signed   By: Marin Olp M.D.   On: 09/09/2015 09:57   Dg Hip Operative Unilat With Pelvis Right  09/09/2015  CLINICAL DATA:  Total hip replacement EXAM: OPERATIVE right HIP (WITH PELVIS IF PERFORMED)  VIEWS TECHNIQUE: Fluoroscopic spot image(s) were submitted for interpretation post-operatively. COMPARISON:  None FINDINGS: 5 intraprocedural fluoroscopic images of the right hip show placement of a total hip arthroplasty. No evidence of intraoperative fracture or dislocation. IMPRESSION: Fluoroscopy for total hip arthroplasty on the right. Electronically Signed   By: Monte Fantasia M.D.   On: 09/09/2015 09:03    Disposition: 01-Home or Self Care      Discharge Instructions    Discharge patient    Complete by:  As directed            Follow-up Information    Follow up with Mcarthur Rossetti, MD In 2 weeks.   Specialty:  Orthopedic Surgery   Contact information:    [place] [address] [phone]        Signed: Mcarthur Rossetti 09/10/2015, 10:51 AM   !

## 2015-09-10 NOTE — Progress Notes (Signed)
Patient ID: Matthew [name], male   DOB: 1956-07-07, 59 y.o.   MRN: [id number] Doing great.  Can be discharged to home today.

## 2015-09-10 NOTE — Progress Notes (Signed)
Physical Therapy Treatment Patient Details Name: MIKE HAMRE MRN: [id number] DOB: [birth year] Today's Date: 09/10/2015    History of Present Illness 59 yo male s/p R THA-direct anterior 09/09/15    PT Comments    Progressing well with mobility. Practiced ambulation and stair negotiation. No further questions from pt. All education completed. Ready to d/c from PT standpoint.   Follow Up Recommendations  Home health PT;Supervision - Intermittent     Equipment Recommendations  None recommended by PT    Recommendations for Other Services       Precautions / Restrictions Precautions Precautions: Fall Restrictions Weight Bearing Restrictions: No RLE Weight Bearing: Weight bearing as tolerated    Mobility  Bed Mobility   Transfers Overall transfer level: Needs assistance Equipment used: Rolling walker (2 wheeled) Transfers: Sit to/from Stand Sit to Stand: Supervision         General transfer comment: VCs safety.  Ambulation/Gait Ambulation/Gait assistance: Supervision Ambulation Distance (Feet): 200 Feet Assistive device: Rolling walker (2 wheeled) Gait Pattern/deviations: Step-through pattern     General Gait Details: supervision for safety   Stairs Stairs: Yes Stairs assistance: Min guard Stair Management: Forwards;One rail Left;Step to pattern Number of Stairs: 4 General stair comments: close guard for safety. VCs safety, sequence, technique.   Wheelchair Mobility    Modified Rankin (Stroke Patients Only)       Balance                                    Cognition Arousal/Alertness: Awake/alert Behavior During Therapy: WFL for tasks assessed/performed Overall Cognitive Status: Within Functional Limits for tasks assessed                      Exercises Total Joint Exercises Ankle Circles/Pumps: AROM;Both;10 reps;Supine Quad Sets: AROM;Both;10 reps;Supine Heel Slides: AAROM;Left;10 reps;Supine Hip  ABduction/ADduction: AAROM;Left;10 reps;Supine    General Comments        Pertinent Vitals/Pain Pain Assessment: 0-10 Pain Score: 5  Pain Location: R hip Pain Descriptors / Indicators: Burning Pain Intervention(s): Repositioned    Home Living                      Prior Function            PT Goals (current goals can now be found in the care plan section) Progress towards PT goals: Progressing toward goals    Frequency  7X/week    PT Plan Current plan remains appropriate    Co-evaluation             End of Session   Activity Tolerance: Patient tolerated treatment well Patient left: in chair;with call bell/phone within reach;with family/visitor present     Time: 0923-3007 PT Time Calculation (min) (ACUTE ONLY): 9 min  Charges:  $Gait Training: 8-22 mins $Therapeutic Exercise: 8-22 mins                    G Codes:      Weston Anna, MPT Pager: [phone]

## 2015-09-10 NOTE — Op Note (Signed)
[name], [name] NO.:  [id number]  MEDICAL RECORD NO.:  [id number]  LOCATION:  3086                         FACILITY:  [facility]  PHYSICIAN:  Lind Guest. Ninfa Linden, M.D.DATE OF BIRTH:  06/10/1956  DATE OF PROCEDURE:  09/09/2015 DATE OF DISCHARGE:                              OPERATIVE REPORT   PREOPERATIVE DIAGNOSIS:  Primary osteoarthritis and degenerative joint disease, right hip.  POSTOPERATIVE DIAGNOSIS:  Primary osteoarthritis and degenerative joint disease, right hip.  PROCEDURE:  Right total hip arthroplasty through direct anterior approach.  IMPLANTS:  DePuy Sector Gription acetabular component size 56, size 36+ 0 neutral polyethylene liner, size 11 Corail femoral component with varus offset (KLA), size 36+ 5 ceramic hip ball.  SURGEON:  Lind Guest. Ninfa Linden, M.D.  ASSISTANT:  Erskine Emery, PA-C.  ANESTHESIA:  Spinal.  ANTIBIOTICS:  2 g IV Ancef.  BLOOD LOSS:  500-600 mL.  COMPLICATIONS:  None.  INDICATIONS:  Mr. Matthew [name] is a 59 year old gentleman with debilitating right hip pain.  He had been doing well and has had bilateral knee replacements done elsewhere, but then he sustained a mechanical fall sometime last year and developed worsening hip pain pretty progressively with that right hip.  X-rays and MRI did show extensive superolateral wear of the acetabulum and the femoral head and at this point, with variable methods of conservative treatment, he wished to proceed with a total hip arthroplasty due to his daily pain, his decreased mobility, and his decreased quality of life.  He understands the risks of acute blood loss anemia, nerve and vessel injury, fracture, infection, dislocation, DVT.  He understands our goals are decreased pain, improved mobility, and overall improved quality of life.  PROCEDURE DESCRIPTION:  After informed consent was obtained, appropriate right hip was marked.  He was brought to the operating room  and spinal anesthesia was obtained while he was on the stretcher.  A Foley catheter was then placed when he was placed back supine on a stretcher and traction boots were placed on both his feet.  Next, he was placed supine on the Hana fracture table with the perineal post in place and both legs in inline skeletal traction devices, but no traction applied.  His right operative hip was prepped and draped with DuraPrep and sterile drapes. Time-out was called to identify correct patient, correct right hip.  I then made an incision inferior and posterior to the anterior superior iliac spine and carried this obliquely down the leg.  We dissected down to the tensor fascia lata muscle.  Tensor fascia was then divided longitudinally, so we could proceed with a  direct anterior approach to the hip.  We identified and cauterized the lateral femoral circumflex vessels and then identified the hip capsule.  We placed a Cobra retractor on the lateral medial femoral neck and then opened up the hip capsule in an L-type format finding of moderate fusion.  We did find periarticular osteophytes and we had to place the Cobra retractors within the hip capsule.  We then made our femoral neck cut with an oscillating saw proximal to the lesser trochanter and completed this on osteotome.  We placed a corkscrew guide in the femoral head and removed  the femoral head in its entirety.  We then placed a bent Hohmann on the medial acetabular rim and cleaned the acetabular remnants of acetabular labrum and debris.  We then began reaming under direct visualization from a size 42 reamer all the way up to a size 56 with all reamers under direct visualization and the last 2 reamers also under direct fluoroscopy, so we could obtain our depth of reaming, our inclination, and anteversion.  Once we were pleased with this, we placed a real DePuy Sector Gription acetabular component size 58 and a 36+ 0 polyethylene liner for that  size acetabular component.  Attention was then turned to the femur.  With the leg externally rotated to 100 degrees extended and adducted, we were able to place a Mueller retractor medially and Hohmann retractor behind the greater trochanter.  We released the lateral joint capsule and then used a box cutting osteotome to enter from the femoral canal and a rongeur to lateralize.  We then began broaching from a size 8 broach using the Corail broaching system up to a size 11.  With size 11, based on his anatomy, we trialed a varus offset femoral neck and a 36+ 1.5 hip ball, leg back over and with traction and internal rotation reduced in the pelvis and he appeared to be stable, but felt like he needed just a little bit more offset and leg lengths.  We then dislocated the hip and removed the trial components.  We then placed the real Corail femoral component size 11 with varus offset and a real 36+ 5 ceramic hip ball.  We reduced this into the acetabulum.  We were pleased with leg length offset and stability on range of motion.  We then irrigated the soft tissues with normal saline solution using pulsatile lavage.  I did not have any real joint capsule to close, so we used a running #1 Vicryl in the tensor fascia, 0 Vicryl in deep tissue, 2-0 Vicryl in the  subcutaneous tissue, and interrupted staples on the skin. Xeroform and an Aquacel dressing was applied.  He was then taken off the Hana table and taken to the recovery room in stable condition.  All final counts were correct.  There were no complications noted.  Of note, Erskine Emery, PA-C, was here to facilitate all aspects of the case.     Lind Guest. Ninfa Linden, M.D.     CYB/MEDQ  D:  09/09/2015  T:  09/09/2015  Job:  [id number]

## 2015-09-10 NOTE — Care Management Note (Addendum)
Case Management Note  Patient Details  Name: CLAUD GOWAN MRN: [id number] Date of Birth: Jan 05, 1956  Subjective/Objective:                  Right total hip arthroplasty through direct anterior approach.  Action/Plan: Hip replacement surgery  Expected Discharge Date:  09/11/15               Expected Discharge Plan: Home with Home health services     In-House Referral:     Discharge planning Services Care Management Consult  Post Acute Care Choice:   [place] Choice offered to:   Patient  DME Arranged:   none DME Agency:  n/a HH Arranged:   Home Health Physical Therapy HH Agency:   [facility]   Status of Service: Completed signed  off    Medicare Important Message Given:    Date Medicare IM Given:    Medicare IM give by:    Date Additional Medicare IM Given:    Additional Medicare Important Message give by:     If discussed at [place] of Stay Meetings, dates discussed:    Additional Comments:  Patient dcd with [facility] orders for Physical Therapy.  Per nurse patient had used [facility] prior to this admission.  Contacted patient offered choice. Patient chose [facility].  Contacted [facility] rep, notified of discharge today.  Patient was set previously with [facility]..  No DME needs patient states he has all needed equipment.  Lawrence Roldan, Sharene Skeans, RN 09/10/2015, 1:26 PM

## 2016-02-13 DIAGNOSIS — H6692 Otitis media, unspecified, left ear: Secondary | ICD-10-CM | POA: Diagnosis not present

## 2016-02-13 DIAGNOSIS — I119 Hypertensive heart disease without heart failure: Secondary | ICD-10-CM | POA: Diagnosis not present

## 2016-03-28 DIAGNOSIS — J40 Bronchitis, not specified as acute or chronic: Secondary | ICD-10-CM | POA: Diagnosis not present

## 2016-03-28 DIAGNOSIS — I119 Hypertensive heart disease without heart failure: Secondary | ICD-10-CM | POA: Diagnosis not present

## 2016-05-16 DIAGNOSIS — M7542 Impingement syndrome of left shoulder: Secondary | ICD-10-CM | POA: Diagnosis not present

## 2016-05-16 DIAGNOSIS — M1611 Unilateral primary osteoarthritis, right hip: Secondary | ICD-10-CM | POA: Diagnosis not present

## 2016-05-16 DIAGNOSIS — M25551 Pain in right hip: Secondary | ICD-10-CM | POA: Diagnosis not present

## 2016-05-30 DIAGNOSIS — H40051 Ocular hypertension, right eye: Secondary | ICD-10-CM | POA: Diagnosis not present

## 2016-05-30 DIAGNOSIS — H40012 Open angle with borderline findings, low risk, left eye: Secondary | ICD-10-CM | POA: Diagnosis not present

## 2016-05-30 DIAGNOSIS — H40011 Open angle with borderline findings, low risk, right eye: Secondary | ICD-10-CM | POA: Diagnosis not present

## 2016-05-30 DIAGNOSIS — H40052 Ocular hypertension, left eye: Secondary | ICD-10-CM | POA: Diagnosis not present

## 2016-06-13 DIAGNOSIS — M25551 Pain in right hip: Secondary | ICD-10-CM | POA: Diagnosis not present

## 2016-06-13 DIAGNOSIS — M1611 Unilateral primary osteoarthritis, right hip: Secondary | ICD-10-CM | POA: Diagnosis not present

## 2016-06-13 DIAGNOSIS — M7542 Impingement syndrome of left shoulder: Secondary | ICD-10-CM | POA: Diagnosis not present

## 2016-06-30 ENCOUNTER — Encounter (INDEPENDENT_AMBULATORY_CARE_PROVIDER_SITE_OTHER): Payer: Self-pay

## 2016-07-27 ENCOUNTER — Other Ambulatory Visit (HOSPITAL_BASED_OUTPATIENT_CLINIC_OR_DEPARTMENT_OTHER): Payer: Self-pay | Admitting: Orthopaedic Surgery

## 2016-07-27 DIAGNOSIS — Z96649 Presence of unspecified artificial hip joint: Secondary | ICD-10-CM

## 2016-07-30 ENCOUNTER — Ambulatory Visit (HOSPITAL_BASED_OUTPATIENT_CLINIC_OR_DEPARTMENT_OTHER)
Admission: RE | Admit: 2016-07-30 | Discharge: 2016-07-30 | Disposition: A | Discharge status: Home / Self Care | Payer: BLUE CROSS/BLUE SHIELD | Source: Ambulatory Visit | Attending: Orthopaedic Surgery | Admitting: Orthopaedic Surgery

## 2016-07-30 DIAGNOSIS — I700 Atherosclerosis of aorta: Secondary | ICD-10-CM | POA: Insufficient documentation

## 2016-07-30 DIAGNOSIS — Z96641 Presence of right artificial hip joint: Secondary | ICD-10-CM | POA: Insufficient documentation

## 2016-07-30 DIAGNOSIS — M47816 Spondylosis without myelopathy or radiculopathy, lumbar region: Secondary | ICD-10-CM | POA: Diagnosis not present

## 2016-07-30 DIAGNOSIS — I708 Atherosclerosis of other arteries: Secondary | ICD-10-CM | POA: Diagnosis not present

## 2016-07-30 DIAGNOSIS — M545 Low back pain: Secondary | ICD-10-CM | POA: Diagnosis not present

## 2016-07-30 DIAGNOSIS — Z471 Aftercare following joint replacement surgery: Secondary | ICD-10-CM | POA: Diagnosis not present

## 2016-07-30 DIAGNOSIS — M25551 Pain in right hip: Secondary | ICD-10-CM | POA: Diagnosis not present

## 2016-07-30 DIAGNOSIS — Z96649 Presence of unspecified artificial hip joint: Secondary | ICD-10-CM

## 2016-08-20 DIAGNOSIS — M1990 Unspecified osteoarthritis, unspecified site: Secondary | ICD-10-CM | POA: Diagnosis not present

## 2016-08-20 DIAGNOSIS — Z0000 Encounter for general adult medical examination without abnormal findings: Secondary | ICD-10-CM | POA: Diagnosis not present

## 2016-08-20 DIAGNOSIS — I10 Essential (primary) hypertension: Secondary | ICD-10-CM | POA: Diagnosis not present

## 2016-08-20 DIAGNOSIS — Z1283 Encounter for screening for malignant neoplasm of skin: Secondary | ICD-10-CM | POA: Diagnosis not present

## 2016-09-17 ENCOUNTER — Ambulatory Visit (INDEPENDENT_AMBULATORY_CARE_PROVIDER_SITE_OTHER): Payer: BLUE CROSS/BLUE SHIELD | Admitting: Orthopaedic Surgery

## 2016-09-17 DIAGNOSIS — M25551 Pain in right hip: Secondary | ICD-10-CM | POA: Diagnosis not present

## 2016-09-17 DIAGNOSIS — M7061 Trochanteric bursitis, right hip: Secondary | ICD-10-CM

## 2016-09-17 MED ORDER — LIDOCAINE HCL 1 % IJ SOLN
3.0000 mL | INTRAMUSCULAR | Status: AC | PRN
  Administered 2016-09-17: 3 mL

## 2016-09-17 MED ORDER — METHYLPREDNISOLONE ACETATE 40 MG/ML IJ SUSP
40.0000 mg | INTRAMUSCULAR | Status: AC | PRN
  Administered 2016-09-17: 40 mg via INTRA_ARTICULAR

## 2016-09-17 NOTE — Progress Notes (Signed)
Office Visit Note   Patient: Matthew [name]           Date of Birth: 11/26/1955           MRN: [id number] Visit Date: 09/17/2016              Requested by: Levin Erp, MD [address], [address] [place], [place] [address] PCP: Criselda Peaches, MD   Assessment & Plan: Visit Diagnoses:  1. Pain of right hip joint   2. Trochanteric bursitis, right hip     Plan: He tolerated the right hip trochanteric injection well. I talked to him about his previous back x-rays that show his L5-S1 fusion. He may end up needing to see Dr. Carloyn Manner again released a spine specialist if this symptoms persist as it relates to his back. His far as his hip goes and we'll follow that up on a regular basis. Follow-Up Instructions: Return in about 4 weeks (around 10/15/2016).   Orders:  Orders Placed This Encounter  Procedures  . Large Joint Injection/Arthrocentesis   No orders of the defined types were placed in this encounter.     Procedures: Large Joint Inj Date/Time: 09/17/2016 9:49 AM Performed by: Mcarthur Rossetti Authorized by: Mcarthur Rossetti   Location:  Hip Site:  R greater trochanter Needle Size:  22 G Approach:  Lateral Ultrasound Guidance: No   Fluoroscopic Guidance: No   Arthrogram: No Medications:  3 mL lidocaine 1 %; 40 mg methylPREDNISolone acetate 40 MG/ML     Clinical Data: No additional findings.   Subjective: Chief Complaint  Patient presents with  . Lower Back - Pain, Follow-up    Hx of fusion in his back, still bothering him quite a bit, aswell as his right hip at times.  . Right Hip - Pain, Follow-up    HPI  Review of Systems   Objective: Vital Signs: There were no vitals taken for this visit.  Physical Exam  Ortho Exam He has fluid active and passive range of motion of his right hip. There is no pain in the groin. There is pain of the trochanteric area of his hip. Specialty Comments:  No specialty comments available.  Imaging: No  results found.   PMFS History: Patient Active Problem List   Diagnosis Date Noted  . Osteoarthritis of right hip 09/09/2015  . Status post total replacement of right hip 09/09/2015  . Chest pain 02/05/2014  . Palpitations 02/05/2014  . Dyspnea 02/05/2014  . Dehydration 02/05/2014   Past Medical History:  Diagnosis Date  . Anxiety   . Arthritis    KNEES BACK SHOULDERS  . Cancer (HCC)    HX SKIN CANCER  . Hx of nonmelanoma skin cancer   . Hypertension    FOLLOWED BY DR ED GREEN    Family History  Problem Relation Age of Onset  . Heart attack Brother     33  . Heart attack Paternal Grandfather     5 bypass surgeries  . Cancer Father     died of bladder cancer     Past Surgical History:  Procedure Laterality Date  . BACK SURGERY  2010   L5S1 FUSION  . KNEE ARTHROPLASTY  2010   L  . KNEE ARTHROSCOPY     L X 2  . REPLACEMENT TOTAL KNEE Left 11/18/2008  . TOTAL HIP ARTHROPLASTY Right 09/09/2015   Procedure: RIGHT TOTAL HIP ARTHROPLASTY ANTERIOR APPROACH;  Surgeon: Mcarthur Rossetti, MD;  Location: [facility];  Service: Orthopedics;  Laterality: Right;  . TOTAL KNEE ARTHROPLASTY  10/08/2011   Procedure: TOTAL KNEE ARTHROPLASTY;  Surgeon: Rudean Haskell, MD;  Location: [facility];  Service: Orthopedics;  Laterality: Right;  TOTAL RIGHT KNEE ARTHROPLASTY    Social History   Occupational History  . Not on file.   Social History Main Topics  . Smoking status: Never Smoker  . Smokeless tobacco: Never Used  . Alcohol use 0.6 oz/week    1 Cans of beer per week     Comment: OCCASIONAL  . Drug use: No  . Sexual activity: Not on file

## 2016-10-01 ENCOUNTER — Ambulatory Visit (INDEPENDENT_AMBULATORY_CARE_PROVIDER_SITE_OTHER): Payer: BLUE CROSS/BLUE SHIELD | Admitting: Orthopaedic Surgery

## 2016-10-01 DIAGNOSIS — M25551 Pain in right hip: Secondary | ICD-10-CM

## 2016-10-01 NOTE — Progress Notes (Signed)
Office Visit Note   Patient: Matthew [name]           Date of Birth: 1956-03-27           MRN: [id number] Visit Date: 10/01/2016              Requested by: Levin Erp, MD [address] [place], [place] [address] PCP: Criselda Peaches, MD   Assessment & Plan: Visit Diagnoses: No diagnosis found.  Plan: At this point I think Mr. Lavell Luster still would benefit from an MRI of his right hip to assess any fluid collections around the hip or any aspect worrisome for prosthetic loosening. He is over a year out from his surgery. There still likely component of this being his back as well. And he will follow-up with Dr. Carloyn Manner for this.  Follow-Up Instructions: Return in about 3 weeks (around 10/22/2016).   Orders:  No orders of the defined types were placed in this encounter.  No orders of the defined types were placed in this encounter.     Procedures: No procedures performed   Clinical Data: No additional findings.   Subjective: Chief Complaint  Patient presents with  . Right Hip - Pain    Patient states he is still having hip pain. States injection only helped for a short time. MRI next?  We tried an injection in the right hip trochanteric region and this did help somewhat with his pain but he still has a deep aching pain of that hip. Of concern with previous x-rays it may show prosthetic loosening of the femoral component.  HPI  Review of Systems   Objective: Vital Signs: There were no vitals taken for this visit.  Physical Exam  Ortho Exam His right hip has fluid internal and external rotation passively and actively with some mild pain deep and over the trochanteric area Specialty Comments:  No specialty comments available.  Imaging: No results found.   PMFS History: Patient Active Problem List   Diagnosis Date Noted  . Osteoarthritis of right hip 09/09/2015  . Status post total replacement of right hip 09/09/2015  . Chest pain 02/05/2014  .  Palpitations 02/05/2014  . Dyspnea 02/05/2014  . Dehydration 02/05/2014   Past Medical History:  Diagnosis Date  . Anxiety   . Arthritis    KNEES BACK SHOULDERS  . Cancer (HCC)    HX SKIN CANCER  . Hx of nonmelanoma skin cancer   . Hypertension    FOLLOWED BY DR ED GREEN    Family History  Problem Relation Age of Onset  . Heart attack Brother     85  . Heart attack Paternal Grandfather     5 bypass surgeries  . Cancer Father     died of bladder cancer     Past Surgical History:  Procedure Laterality Date  . BACK SURGERY  2010   L5S1 FUSION  . KNEE ARTHROPLASTY  2010   L  . KNEE ARTHROSCOPY     L X 2  . REPLACEMENT TOTAL KNEE Left 11/18/2008  . TOTAL HIP ARTHROPLASTY Right 09/09/2015   Procedure: RIGHT TOTAL HIP ARTHROPLASTY ANTERIOR APPROACH;  Surgeon: Mcarthur Rossetti, MD;  Location: [facility];  Service: Orthopedics;  Laterality: Right;  . TOTAL KNEE ARTHROPLASTY  10/08/2011   Procedure: TOTAL KNEE ARTHROPLASTY;  Surgeon: Rudean Haskell, MD;  Location: [facility];  Service: Orthopedics;  Laterality: Right;  TOTAL RIGHT KNEE ARTHROPLASTY    Social History  Occupational History  . Not on file.   Social History Main Topics  . Smoking status: Never Smoker  . Smokeless tobacco: Never Used  . Alcohol use 0.6 oz/week    1 Cans of beer per week     Comment: OCCASIONAL  . Drug use: No  . Sexual activity: Not on file

## 2016-10-15 ENCOUNTER — Ambulatory Visit
Admission: RE | Admit: 2016-10-15 | Discharge: 2016-10-15 | Disposition: A | Discharge status: Home / Self Care | Payer: BLUE CROSS/BLUE SHIELD | Source: Ambulatory Visit | Attending: Orthopaedic Surgery | Admitting: Orthopaedic Surgery

## 2016-10-15 DIAGNOSIS — M25551 Pain in right hip: Secondary | ICD-10-CM

## 2016-10-22 ENCOUNTER — Ambulatory Visit (INDEPENDENT_AMBULATORY_CARE_PROVIDER_SITE_OTHER): Payer: BLUE CROSS/BLUE SHIELD | Admitting: Orthopaedic Surgery

## 2016-10-22 DIAGNOSIS — M25551 Pain in right hip: Secondary | ICD-10-CM

## 2016-10-22 DIAGNOSIS — Z96641 Presence of right artificial hip joint: Secondary | ICD-10-CM | POA: Diagnosis not present

## 2016-10-22 NOTE — Progress Notes (Signed)
The patient is here for follow-up after having an MRI to evaluate his right hip. He is over a year out from her right total hip arthroplasty is been having some pain in his right hip at the trochanteric area. An injection is helped some. He also has some residual back issues and has had back surgery before. We sent him for the MRI to rule out any type of loosening or fluid collection on the hip that would suggest some worrisome problems. He does get stiffness in his back in the morning and over the trochanteric area of his hip. He denies any groin pain currently. He denies any deep thigh pain. Previous plain films suggest potentially prosthetic loosening.  On examination of his right hip he has fluid internal/external rotation hip actively and passively there is no deep thigh pain. There is a small amount of pain of the trochanteric area and a negative straight leg raise. MRI is reviewed of his right hip and I see no worrisome features. There is no fluid collections in the soft tissue. There is no fluid collections around the prosthesis.  At this point we'll continue to watch him closely. I will need to see him back for 3 months. At that visit I would like an AP and lateral of his right hip. If he has problems before then he will let us know.

## 2016-11-13 DIAGNOSIS — H52203 Unspecified astigmatism, bilateral: Secondary | ICD-10-CM | POA: Diagnosis not present

## 2016-11-13 DIAGNOSIS — H40052 Ocular hypertension, left eye: Secondary | ICD-10-CM | POA: Diagnosis not present

## 2016-11-13 DIAGNOSIS — H40051 Ocular hypertension, right eye: Secondary | ICD-10-CM | POA: Diagnosis not present

## 2016-11-13 DIAGNOSIS — H35371 Puckering of macula, right eye: Secondary | ICD-10-CM | POA: Diagnosis not present

## 2016-11-21 DIAGNOSIS — M4306 Spondylolysis, lumbar region: Secondary | ICD-10-CM | POA: Diagnosis not present

## 2016-11-21 DIAGNOSIS — M47816 Spondylosis without myelopathy or radiculopathy, lumbar region: Secondary | ICD-10-CM | POA: Diagnosis not present

## 2016-11-23 ENCOUNTER — Other Ambulatory Visit: Payer: Self-pay | Admitting: Neurosurgery

## 2016-11-23 DIAGNOSIS — M4306 Spondylolysis, lumbar region: Secondary | ICD-10-CM

## 2016-12-01 ENCOUNTER — Ambulatory Visit
Admission: RE | Admit: 2016-12-01 | Discharge: 2016-12-01 | Disposition: A | Discharge status: Home / Self Care | Payer: BLUE CROSS/BLUE SHIELD | Source: Ambulatory Visit | Attending: Neurosurgery | Admitting: Neurosurgery

## 2016-12-01 DIAGNOSIS — M4306 Spondylolysis, lumbar region: Secondary | ICD-10-CM

## 2016-12-01 DIAGNOSIS — M5126 Other intervertebral disc displacement, lumbar region: Secondary | ICD-10-CM | POA: Diagnosis not present

## 2016-12-01 DIAGNOSIS — M48061 Spinal stenosis, lumbar region without neurogenic claudication: Secondary | ICD-10-CM | POA: Diagnosis not present

## 2016-12-04 DIAGNOSIS — M47816 Spondylosis without myelopathy or radiculopathy, lumbar region: Secondary | ICD-10-CM | POA: Diagnosis not present

## 2016-12-04 DIAGNOSIS — M48061 Spinal stenosis, lumbar region without neurogenic claudication: Secondary | ICD-10-CM | POA: Diagnosis not present

## 2016-12-04 DIAGNOSIS — M4306 Spondylolysis, lumbar region: Secondary | ICD-10-CM | POA: Diagnosis not present

## 2017-04-22 ENCOUNTER — Telehealth (INDEPENDENT_AMBULATORY_CARE_PROVIDER_SITE_OTHER): Payer: Self-pay | Admitting: Orthopaedic Surgery

## 2017-04-22 NOTE — Telephone Encounter (Signed)
Patient called requesting copy of records. I informed we need medical records release form. I emailed to him per his request to [email or web address]

## 2017-04-24 NOTE — Telephone Encounter (Signed)
I called patient and advised copy of records are ready to pick up.

## 2017-05-29 DIAGNOSIS — H40053 Ocular hypertension, bilateral: Secondary | ICD-10-CM | POA: Diagnosis not present

## 2017-05-29 DIAGNOSIS — H40013 Open angle with borderline findings, low risk, bilateral: Secondary | ICD-10-CM | POA: Diagnosis not present

## 2017-09-02 DIAGNOSIS — Z1211 Encounter for screening for malignant neoplasm of colon: Secondary | ICD-10-CM | POA: Diagnosis not present

## 2017-09-02 DIAGNOSIS — Z0000 Encounter for general adult medical examination without abnormal findings: Secondary | ICD-10-CM | POA: Diagnosis not present

## 2017-09-02 DIAGNOSIS — M1990 Unspecified osteoarthritis, unspecified site: Secondary | ICD-10-CM | POA: Diagnosis not present

## 2017-09-02 DIAGNOSIS — I10 Essential (primary) hypertension: Secondary | ICD-10-CM | POA: Diagnosis not present

## 2017-10-18 DIAGNOSIS — Z6839 Body mass index (BMI) 39.0-39.9, adult: Secondary | ICD-10-CM | POA: Diagnosis not present

## 2017-10-18 DIAGNOSIS — M47816 Spondylosis without myelopathy or radiculopathy, lumbar region: Secondary | ICD-10-CM | POA: Diagnosis not present

## 2017-10-24 ENCOUNTER — Other Ambulatory Visit: Payer: Self-pay | Admitting: Neurosurgery

## 2017-10-24 DIAGNOSIS — M47816 Spondylosis without myelopathy or radiculopathy, lumbar region: Secondary | ICD-10-CM

## 2017-10-30 DIAGNOSIS — M47816 Spondylosis without myelopathy or radiculopathy, lumbar region: Secondary | ICD-10-CM | POA: Insufficient documentation

## 2017-10-30 DIAGNOSIS — M5416 Radiculopathy, lumbar region: Secondary | ICD-10-CM | POA: Insufficient documentation

## 2017-11-14 ENCOUNTER — Ambulatory Visit
Admission: RE | Admit: 2017-11-14 | Discharge: 2017-11-14 | Disposition: A | Discharge status: Home / Self Care | Payer: BLUE CROSS/BLUE SHIELD | Source: Ambulatory Visit | Attending: Neurosurgery | Admitting: Neurosurgery

## 2017-11-14 DIAGNOSIS — M47816 Spondylosis without myelopathy or radiculopathy, lumbar region: Secondary | ICD-10-CM

## 2017-11-14 DIAGNOSIS — M7138 Other bursal cyst, other site: Secondary | ICD-10-CM | POA: Diagnosis not present

## 2017-11-14 MED ORDER — IOPAMIDOL (ISOVUE-M 200) INJECTION 41%
1.0000 mL | Freq: Once | INTRAMUSCULAR | Status: AC
  Administered 2017-11-14: 1 mL via INTRA_ARTICULAR

## 2017-11-14 MED ORDER — METHYLPREDNISOLONE ACETATE 40 MG/ML INJ SUSP (RADIOLOG
120.0000 mg | Freq: Once | INTRAMUSCULAR | Status: AC
  Administered 2017-11-14: 120 mg via INTRA_ARTICULAR

## 2017-11-14 NOTE — Discharge Instructions (Signed)

## 2018-02-17 DIAGNOSIS — H40013 Open angle with borderline findings, low risk, bilateral: Secondary | ICD-10-CM | POA: Diagnosis not present

## 2018-02-17 DIAGNOSIS — H5213 Myopia, bilateral: Secondary | ICD-10-CM | POA: Diagnosis not present

## 2018-02-17 DIAGNOSIS — H40053 Ocular hypertension, bilateral: Secondary | ICD-10-CM | POA: Diagnosis not present

## 2018-08-01 ENCOUNTER — Other Ambulatory Visit: Payer: Self-pay | Admitting: Nurse Practitioner

## 2018-08-01 DIAGNOSIS — M47816 Spondylosis without myelopathy or radiculopathy, lumbar region: Secondary | ICD-10-CM

## 2018-09-03 ENCOUNTER — Ambulatory Visit
Admission: RE | Admit: 2018-09-03 | Discharge: 2018-09-03 | Disposition: A | Discharge status: Home / Self Care | Payer: BLUE CROSS/BLUE SHIELD | Source: Ambulatory Visit | Attending: Nurse Practitioner | Admitting: Nurse Practitioner

## 2018-09-03 DIAGNOSIS — M47816 Spondylosis without myelopathy or radiculopathy, lumbar region: Secondary | ICD-10-CM

## 2018-09-03 DIAGNOSIS — M545 Low back pain: Secondary | ICD-10-CM | POA: Diagnosis not present

## 2018-09-03 MED ORDER — METHYLPREDNISOLONE ACETATE 40 MG/ML INJ SUSP (RADIOLOG
120.0000 mg | Freq: Once | INTRAMUSCULAR | Status: AC
  Administered 2018-09-03: 120 mg via INTRA_ARTICULAR

## 2018-09-03 MED ORDER — IOPAMIDOL (ISOVUE-M 200) INJECTION 41%
1.0000 mL | Freq: Once | INTRAMUSCULAR | Status: AC
  Administered 2018-09-03: 1 mL via INTRA_ARTICULAR

## 2018-09-03 NOTE — Discharge Instructions (Signed)

## 2018-09-08 DIAGNOSIS — H40053 Ocular hypertension, bilateral: Secondary | ICD-10-CM | POA: Diagnosis not present

## 2018-09-08 DIAGNOSIS — H40023 Open angle with borderline findings, high risk, bilateral: Secondary | ICD-10-CM | POA: Diagnosis not present

## 2018-10-01 DIAGNOSIS — Z1211 Encounter for screening for malignant neoplasm of colon: Secondary | ICD-10-CM | POA: Diagnosis not present

## 2018-10-01 DIAGNOSIS — I10 Essential (primary) hypertension: Secondary | ICD-10-CM | POA: Diagnosis not present

## 2018-10-01 DIAGNOSIS — Z0000 Encounter for general adult medical examination without abnormal findings: Secondary | ICD-10-CM | POA: Diagnosis not present

## 2018-10-01 DIAGNOSIS — Z6841 Body Mass Index (BMI) 40.0 and over, adult: Secondary | ICD-10-CM | POA: Diagnosis not present

## 2018-10-01 DIAGNOSIS — M1990 Unspecified osteoarthritis, unspecified site: Secondary | ICD-10-CM | POA: Diagnosis not present

## 2018-12-01 DIAGNOSIS — I10 Essential (primary) hypertension: Secondary | ICD-10-CM | POA: Diagnosis not present

## 2019-02-24 ENCOUNTER — Other Ambulatory Visit: Payer: Self-pay

## 2019-02-24 ENCOUNTER — Ambulatory Visit (INDEPENDENT_AMBULATORY_CARE_PROVIDER_SITE_OTHER): Payer: Self-pay

## 2019-02-24 ENCOUNTER — Ambulatory Visit (INDEPENDENT_AMBULATORY_CARE_PROVIDER_SITE_OTHER): Payer: BLUE CROSS/BLUE SHIELD | Admitting: Orthopaedic Surgery

## 2019-02-24 DIAGNOSIS — G8911 Acute pain due to trauma: Secondary | ICD-10-CM

## 2019-02-24 DIAGNOSIS — M25511 Pain in right shoulder: Secondary | ICD-10-CM

## 2019-02-24 DIAGNOSIS — G8929 Other chronic pain: Secondary | ICD-10-CM | POA: Diagnosis not present

## 2019-02-24 DIAGNOSIS — M25512 Pain in left shoulder: Secondary | ICD-10-CM

## 2019-02-24 MED ORDER — METHYLPREDNISOLONE ACETATE 40 MG/ML IJ SUSP
40.0000 mg | INTRAMUSCULAR | Status: AC | PRN
  Administered 2019-02-24: 10:00:00 40 mg via INTRA_ARTICULAR

## 2019-02-24 MED ORDER — HYDROCODONE-ACETAMINOPHEN 5-325 MG PO TABS: 1.0000 | ORAL_TABLET | Freq: Four times a day (QID) | ORAL | 0 refills | Status: DC | PRN

## 2019-02-24 MED ORDER — CELECOXIB 200 MG PO CAPS: 200.0000 mg | ORAL_CAPSULE | Freq: Two times a day (BID) | ORAL | 3 refills | Status: DC | PRN

## 2019-02-24 MED ORDER — LIDOCAINE HCL 1 % IJ SOLN
3.0000 mL | INTRAMUSCULAR | Status: AC | PRN
  Administered 2019-02-24: 3 mL

## 2019-02-24 NOTE — Progress Notes (Signed)
+                                                                                                                                                                                        Office Visit Note   Patient: Matthew [name]           Date of Birth: [birth year]           MRN: [id number] Visit Date: 02/24/2019              Requested by: Matthew Erp, MD [address] [place], [place] [address] PCP: Matthew Erp, MD   Assessment & Plan: Visit Diagnoses:  1. Chronic left shoulder pain   2. Chronic right shoulder pain   3. Acute pain of left shoulder due to trauma     Plan: I will put him on Celebrex and some hydrocodone.  I did feel is appropriate to place a steroid injection in the left shoulder subacromial space to see if this will help her a pain standpoint.  I will have him work on gentle range of motion of his left shoulder.  I would like to see him back in just 2 weeks for repeat exam of his left shoulder.  All question concerns were answered and addressed.  Hopefully the medications and injection will help as well.  Follow-Up Instructions: Return in about 2 weeks (around 03/10/2019).   Orders:  Orders Placed This Encounter  Procedures  . Large Joint Inj  . XR Shoulder Right  . XR Shoulder Left   Meds ordered this encounter  Medications  . HYDROcodone-acetaminophen (NORCO/VICODIN) 5-325 MG tablet    Sig: Take 1-2 tablets by mouth every 6 (six) hours as needed for moderate pain.    Dispense:  30 tablet    Refill:  0  . celecoxib (CELEBREX) 200 MG capsule    Sig: Take 1 capsule (200 mg total) by mouth 2 (two) times daily between meals as needed.    Dispense:  60 capsule    Refill:  3      Procedures: Large Joint Inj: L subacromial bursa on 02/24/2019 10:28 AM Indications:  pain and diagnostic evaluation Details: 22 G 1.5 in needle  Arthrogram: No  Medications: 3 mL lidocaine 1 %; 40 mg methylPREDNISolone acetate 40 MG/ML Outcome: tolerated well, no immediate complications Procedure, treatment alternatives, risks and benefits explained, specific risks discussed. Consent was given by the patient. Immediately prior to procedure a time out was called to verify the correct patient, procedure, equipment, support staff and site/side marked as required. Patient was prepped and draped in the usual sterile fashion.  Clinical Data: No additional findings.   Subjective: Chief Complaint  Patient presents with  . Left Shoulder - Pain  . Right Shoulder - Pain  The patient comes in today for assessment and treatment of the left shoulder injury.  He tripped over some dollies in a warehouse I believe yesterday landing hard on his left shoulder.  He had had some chronic left shoulder issues and chronic pain in the right shoulder.  When I saw him last some time ago we had prescribed Celebrex but he never got it filled.  He now reports significant left shoulder pain with overhead activities and try to reach behind him as well.  He is 4 years out from a right total hip arthroplasty is also had some back issues.  The left shoulder though is the most acute issue today.  Again both shoulders been hurting with overhead activities before but he did sustain a fall injuring the left shoulder yesterday.  HPI  Review of Systems He currently denies any headache, chest pain, shortness of breath, fever, chills, nausea, vomiting  Objective: Vital Signs: There were no vitals taken for this visit.  Physical Exam Is alert and orient x3 and in no acute distress Ortho Exam Examination of his right shoulder shows definitely signs of impingement and pain at the [facility] joint.  Examination of his left shoulder shows difficulty with abducting and reaching overhead with severe pain with any  attempts of rotation as well causes pain at the shoulder. Specialty Comments:  No specialty comments available.  Imaging: Xr Shoulder Left  Result Date: 02/24/2019 3 views left shoulder show no evidence of an acute injury or fracture.  There is arthritic changes of the [facility] joint.  Xr Shoulder Right  Result Date: 02/24/2019 3 views of the right shoulder show no acute findings.  The shoulder is well located.  There is severe AC joint arthritis.    PMFS History: Patient Active Problem List   Diagnosis Date Noted  . Lumbar facet arthropathy 10/30/2017  . Osteoarthritis of right hip 09/09/2015  . Status post total replacement of right hip 09/09/2015  . Chest pain 02/05/2014  . Palpitations 02/05/2014  . Dyspnea 02/05/2014  . Dehydration 02/05/2014   Past Medical History:  Diagnosis Date  . Anxiety   . Arthritis    KNEES BACK SHOULDERS  . Cancer (HCC)    HX SKIN CANCER  . Hx of nonmelanoma skin cancer   . Hypertension    FOLLOWED BY DR Matthew [name]    Family History  Problem Relation Age of Onset  . Heart attack Brother        25  . Heart attack Paternal Grandfather        5 bypass surgeries  . Cancer Father        died of bladder cancer     Past Surgical History:  Procedure Laterality Date  . BACK SURGERY  2010   L5S1 FUSION  . KNEE ARTHROPLASTY  2010   L  . KNEE ARTHROSCOPY     L X 2  . REPLACEMENT TOTAL KNEE Left 11/18/2008  . TOTAL HIP ARTHROPLASTY Right 09/09/2015   Procedure: RIGHT TOTAL HIP ARTHROPLASTY ANTERIOR APPROACH;  Surgeon: Matthew Rossetti, MD;  Location: [facility];  Service: Orthopedics;  Laterality: Right;  . TOTAL KNEE ARTHROPLASTY  10/08/2011   Procedure: TOTAL KNEE ARTHROPLASTY;  Surgeon: Matthew Haskell, MD;  Location: [facility];  Service: Orthopedics;  Laterality: Right;  TOTAL RIGHT KNEE ARTHROPLASTY  Social History   Occupational History  . Not on file  Tobacco Use  . Smoking status: Never Smoker  . Smokeless tobacco: Never Used   Substance and Sexual Activity  . Alcohol use: Yes    Alcohol/week: 1.0 standard drinks    Types: 1 Cans of beer per week    Comment: OCCASIONAL  . Drug use: No  . Sexual activity: Not on file

## 2019-03-11 ENCOUNTER — Telehealth: Payer: Self-pay | Admitting: Orthopaedic Surgery

## 2019-03-11 ENCOUNTER — Other Ambulatory Visit (INDEPENDENT_AMBULATORY_CARE_PROVIDER_SITE_OTHER): Payer: Self-pay | Admitting: Orthopaedic Surgery

## 2019-03-11 NOTE — Telephone Encounter (Signed)
Ok to rf? 

## 2019-03-11 NOTE — Telephone Encounter (Signed)
Patient is scheduled for return office visit for shoulder, but he'd like for you to take a look at the right hip.  He'd like to also discuss proceeding with an MRI since he feels something is definitely going on with his joints.

## 2019-03-16 ENCOUNTER — Other Ambulatory Visit (INDEPENDENT_AMBULATORY_CARE_PROVIDER_SITE_OTHER): Payer: Self-pay

## 2019-03-16 ENCOUNTER — Encounter: Payer: Self-pay | Admitting: Orthopaedic Surgery

## 2019-03-16 ENCOUNTER — Ambulatory Visit: Payer: Self-pay

## 2019-03-16 ENCOUNTER — Other Ambulatory Visit: Payer: Self-pay

## 2019-03-16 ENCOUNTER — Ambulatory Visit (INDEPENDENT_AMBULATORY_CARE_PROVIDER_SITE_OTHER): Payer: Medicare Other | Admitting: Orthopaedic Surgery

## 2019-03-16 DIAGNOSIS — M25551 Pain in right hip: Secondary | ICD-10-CM

## 2019-03-16 DIAGNOSIS — G8911 Acute pain due to trauma: Secondary | ICD-10-CM | POA: Diagnosis not present

## 2019-03-16 DIAGNOSIS — M25512 Pain in left shoulder: Secondary | ICD-10-CM

## 2019-03-16 DIAGNOSIS — G8929 Other chronic pain: Secondary | ICD-10-CM

## 2019-03-16 MED ORDER — LIDOCAINE HCL 1 % IJ SOLN
3.0000 mL | INTRAMUSCULAR | Status: AC | PRN
  Administered 2019-03-16: 3 mL

## 2019-03-16 MED ORDER — METHYLPREDNISOLONE ACETATE 40 MG/ML IJ SUSP
40.0000 mg | INTRAMUSCULAR | Status: AC | PRN
  Administered 2019-03-16: 40 mg via INTRA_ARTICULAR

## 2019-03-16 MED ORDER — DIAZEPAM 5 MG PO TABS: ORAL_TABLET | ORAL | 0 refills | Status: AC

## 2019-03-16 NOTE — Progress Notes (Signed)
Office Visit Note   Patient: Matthew [name]           Date of Birth: Mar 18, 1956           MRN: [id number] Visit Date: 03/16/2019              Requested by: Levin Erp, MD [address], [address],  [id number] PCP: Levin Erp, MD   Assessment & Plan: Visit Diagnoses:  1. Pain in right hip   2. Acute pain of left shoulder due to trauma     Plan: Due to the fact the patient has continued pain in the left shoulder despite conservative treatment which is consistent time and subacromial injection and the fact that he has diminished range of motion recommend MRI to rule out rotator cuff tear/internal derangement.  Follow-up after the MRI to go over results discuss further treatment.  Due to patient's severe claustrophobia will obtain the MRI of his shoulder needs an open MRI and he is given Valium. In regards to his right hip pain and pain down the right leg he is shown some IT band stretching exercises which he can perform on his own.  We will discuss his pain lateral aspect of the hip and pain down the leg at his next office visit if his pain continues we will refer him to a neurosurgeon as he has been told in the past he needed back surgery by Dr. Carloyn Manner.  Follow-Up Instructions: Return after MRI left shoulder.   Orders:  Orders Placed This Encounter  Procedures  . Large Joint Inj: R greater trochanter  . XR HIP UNILAT W OR W/O PELVIS 1V RIGHT   Meds ordered this encounter  Medications  . diazepam (VALIUM) 5 MG tablet    Sig: TAKE ONE TAB ONE HOUR PRIOR TO MRI REPEAT AS NEEDED #2 . ZERO REFILLS    Dispense:  2 tablet    Refill:  0      Procedures: Large Joint Inj: R greater trochanter on 03/16/2019 10:18 AM Indications: pain Details: 22 G 1.5 in needle, lateral approach  Arthrogram: No  Medications: 3 mL lidocaine 1 %; 40 mg methylPREDNISolone acetate 40 MG/ML Outcome: tolerated well, no immediate complications Procedure, treatment alternatives, risks and  benefits explained, specific risks discussed. Consent was given by the patient. Immediately prior to procedure a time out was called to verify the correct patient, procedure, equipment, support staff and site/side marked as required. Patient was prepped and draped in the usual sterile fashion.       Clinical Data: No additional findings.   Subjective: Chief Complaint  Patient presents with  . Left Shoulder - Follow-up    HPI Mr.Wurm returns today due to his continued left shoulder pain.  Again his shoulder pain occurred when he tripped over some dollars at a warehouse.  He was seen by Dr. Ninfa Linden on 02/24/2019 and at that time was given a subacromial injection right shoulder.  He states it minimally helped.  He continues to have severe shoulder pain at night that awakens him.  He denies any numbness tingling down the arm.  He continues to have left shoulder diminished range of motion due to pain. Is also here today due to right hip pain that started hurting recently.  Said no injury to the right hip.  History of right total hip arthroplasty 10/08/2011.  Having some pain that radiates down the right leg.  Denies any saddle anesthesia like symptoms.  Denies any bowel bladder  dysfunction.  History of lumbar fusion lower lumbar.  He states that he was told by Dr. Carloyn Manner before Dr. Cecille Rubin retired that he would require surgery at the next level L4-5 at sometime in the future.  He continues to have low back pain.  Is having pain whenever he rolls over on the right hip.  Does have some what he describes groin pain on the right.  Review of Systems Denies any fevers chills shortness of breath chest pain  Objective: Vital Signs: There were no vitals taken for this visit.  Physical Exam General: Well-developed well-nourished male no acute distress.  Mood and affect appropriate. Psych: Alert and oriented x3. Ortho Exam Good range of motion bilateral hips without significant pain.  He has tenderness  over the right trochanteric region.  No tenderness over the left. Bilateral shoulders he has 5-5 strength with external and internal rotation against resistance.  Empty can test negative bilaterally.  He has diminished abduction of the left shoulder compared to full on the right.  Full forward flexion on the left.  Unable to perform liftoff test on the left due to pain and decreased range of motion.  Able to perform liftoff test on the right which shows excellent strength . Distal biceps on the left is intact.  Is tenderness in the left bicipital groove region.  There is no bruising tenderness of the left biceps muscle.  No gross deformity of the left shoulder. Specialty Comments:  No specialty comments available.  Imaging: Xr Hip Unilat W Or W/o Pelvis 1v Right  Result Date: 03/16/2019 AP pelvis lateral view of right hip: No acute fracture.  Status post right total hip arthroplasty with well-seated components.  Fusion of the lower lumbar seen on the AP and the oblique view no obvious complications.  Bilateral hips well located.    PMFS History: Patient Active Problem List   Diagnosis Date Noted  . Lumbar facet arthropathy 10/30/2017  . Osteoarthritis of right hip 09/09/2015  . Status post total replacement of right hip 09/09/2015  . Chest pain 02/05/2014  . Palpitations 02/05/2014  . Dyspnea 02/05/2014  . Dehydration 02/05/2014   Past Medical History:  Diagnosis Date  . Anxiety   . Arthritis    KNEES BACK SHOULDERS  . Cancer (HCC)    HX SKIN CANCER  . Hx of nonmelanoma skin cancer   . Hypertension    FOLLOWED BY DR ED GREEN    Family History  Problem Relation Age of Onset  . Heart attack Brother        74  . Heart attack Paternal Grandfather        5 bypass surgeries  . Cancer Father        died of bladder cancer     Past Surgical History:  Procedure Laterality Date  . BACK SURGERY  2010   L5S1 FUSION  . KNEE ARTHROPLASTY  2010   L  . KNEE ARTHROSCOPY     L X 2  .  REPLACEMENT TOTAL KNEE Left 11/18/2008  . TOTAL HIP ARTHROPLASTY Right 09/09/2015   Procedure: RIGHT TOTAL HIP ARTHROPLASTY ANTERIOR APPROACH;  Surgeon: Mcarthur Rossetti, MD;  Location: [facility];  Service: Orthopedics;  Laterality: Right;  . TOTAL KNEE ARTHROPLASTY  10/08/2011   Procedure: TOTAL KNEE ARTHROPLASTY;  Surgeon: Rudean Haskell, MD;  Location: [facility];  Service: Orthopedics;  Laterality: Right;  TOTAL RIGHT KNEE ARTHROPLASTY    Social History   Occupational History  . Not on file  Tobacco Use  . Smoking status: Never Smoker  . Smokeless tobacco: Never Used  Substance and Sexual Activity  . Alcohol use: Yes    Alcohol/week: 1.0 standard drinks    Types: 1 Cans of beer per week    Comment: OCCASIONAL  . Drug use: No  . Sexual activity: Not on file

## 2019-03-20 ENCOUNTER — Telehealth: Payer: Self-pay | Admitting: Physician Assistant

## 2019-03-20 NOTE — Telephone Encounter (Signed)
Patient called stated wanted to get MRI on back as well with the order that was already put in. Only wants to go one time to GI.  Please call patient @ [phone]

## 2019-03-23 ENCOUNTER — Other Ambulatory Visit (INDEPENDENT_AMBULATORY_CARE_PROVIDER_SITE_OTHER): Payer: Self-pay

## 2019-03-23 DIAGNOSIS — M4807 Spinal stenosis, lumbosacral region: Secondary | ICD-10-CM

## 2019-03-23 NOTE — Telephone Encounter (Signed)
I put in an order for this can we add to be done with the shoulder?

## 2019-03-23 NOTE — Telephone Encounter (Signed)
That will be fine.  A MRI of the lumbar spine to r/o HNP.

## 2019-03-23 NOTE — Telephone Encounter (Signed)
See below, add?

## 2019-03-24 NOTE — Telephone Encounter (Signed)
Imaging will call pt to reschedule so can have both done at same time.

## 2019-03-26 ENCOUNTER — Ambulatory Visit
Admission: RE | Admit: 2019-03-26 | Discharge: 2019-03-26 | Disposition: A | Discharge status: Home / Self Care | Payer: Medicare Other | Source: Ambulatory Visit | Attending: Physician Assistant | Admitting: Physician Assistant

## 2019-03-26 ENCOUNTER — Other Ambulatory Visit: Payer: Self-pay

## 2019-03-26 DIAGNOSIS — G8929 Other chronic pain: Secondary | ICD-10-CM

## 2019-04-13 ENCOUNTER — Ambulatory Visit: Payer: Medicare Other | Admitting: Physician Assistant

## 2019-05-04 ENCOUNTER — Ambulatory Visit (INDEPENDENT_AMBULATORY_CARE_PROVIDER_SITE_OTHER): Payer: Medicare Other | Admitting: Orthopaedic Surgery

## 2019-05-04 ENCOUNTER — Encounter: Payer: Self-pay | Admitting: Orthopaedic Surgery

## 2019-05-04 ENCOUNTER — Other Ambulatory Visit: Payer: Self-pay

## 2019-05-04 DIAGNOSIS — S46012D Strain of muscle(s) and tendon(s) of the rotator cuff of left shoulder, subsequent encounter: Secondary | ICD-10-CM | POA: Diagnosis not present

## 2019-05-04 DIAGNOSIS — S46012A Strain of muscle(s) and tendon(s) of the rotator cuff of left shoulder, initial encounter: Secondary | ICD-10-CM | POA: Insufficient documentation

## 2019-05-04 NOTE — Progress Notes (Signed)
The patient is here today to go over an MRI of his left shoulder.  He is 63 years old and very active.  He had a mechanical fall back in April injuring that left shoulder.  On exam he has problems with abduction and external rotation of the left shoulder.  He can do it but is definitely weak and is using his deltoid some to mobilize the shoulder.  It is well located clinically.  It is painful at night and with activities.  MRI is reviewed with him and it does show full-thickness retracted rotator cuff tear with no muscle atrophy indicating acute tear.  He has a biceps tendon rupture as well.  This is evident on clinical exam as well.  I did show him a shoulder model and went over his MRI report and MRI in detail with him.  Given his level of activity and the pain he is having combined with dysfunction of the shoulder and the MRI findings we are recommending a left shoulder arthroscopy with an attempted rotator cuff repair.  I explained in detail what the surgery involves.  We talked about the risk and benefits of surgery and why it is time to proceed with the surgery with the goal of hopefully improving shoulder function and decreasing his pain.  All question concerns were answered and addressed.  We will work on getting this scheduled in the near future.  We will then see him back postoperatively about a week later.  I did stress the importance of wearing a sling last operatively as well as what the recovery course will be with therapy understanding that it can take 6 months or more to recover from the surgery fully.

## 2019-05-07 ENCOUNTER — Other Ambulatory Visit: Payer: Self-pay | Admitting: Neurological Surgery

## 2019-05-07 DIAGNOSIS — M48061 Spinal stenosis, lumbar region without neurogenic claudication: Secondary | ICD-10-CM

## 2019-05-13 HISTORY — PX: COLONOSCOPY: SHX5424

## 2019-05-14 ENCOUNTER — Other Ambulatory Visit: Payer: Self-pay | Admitting: Orthopaedic Surgery

## 2019-05-14 DIAGNOSIS — S46012D Strain of muscle(s) and tendon(s) of the rotator cuff of left shoulder, subsequent encounter: Secondary | ICD-10-CM | POA: Diagnosis not present

## 2019-05-14 HISTORY — PX: SHOULDER ARTHROSCOPY: SHX128

## 2019-05-14 MED ORDER — OXYCODONE-ACETAMINOPHEN 5-325 MG PO TABS: 1.0000 | ORAL_TABLET | Freq: Four times a day (QID) | ORAL | 0 refills | Status: DC | PRN

## 2019-05-21 ENCOUNTER — Other Ambulatory Visit: Payer: Self-pay

## 2019-05-21 ENCOUNTER — Ambulatory Visit (INDEPENDENT_AMBULATORY_CARE_PROVIDER_SITE_OTHER): Payer: Medicare Other | Admitting: Orthopaedic Surgery

## 2019-05-21 ENCOUNTER — Encounter: Payer: Self-pay | Admitting: Orthopaedic Surgery

## 2019-05-21 DIAGNOSIS — S46012D Strain of muscle(s) and tendon(s) of the rotator cuff of left shoulder, subsequent encounter: Secondary | ICD-10-CM

## 2019-05-21 DIAGNOSIS — Z9889 Other specified postprocedural states: Secondary | ICD-10-CM

## 2019-05-21 MED ORDER — HYDROCODONE-ACETAMINOPHEN 5-325 MG PO TABS: ORAL_TABLET | ORAL | 0 refills | Status: DC

## 2019-05-21 NOTE — Progress Notes (Signed)
The patient is one-week status post a left shoulder arthroscopy.  He had a full-thickness complete rotator cuff tear that we were able to repair with a double row technique.  He feels like he is ready for outpatient therapy as July.  He has not had a severe amount of pain but he is still having to take some hydrocodone.  On exam his shoulder is bruised.  His axillary nerve is working.  His suture sites admitted extremity sutures in place Steri-Strips.  We went over his arthroscopy pictures in detail to show him the extent of his repair.  This point it is essential we get him set up for outpatient physical therapy.  This can be done at [facility].  They will work on getting good function of his shoulder with time.  He understands this can take a significant amount of time.  I would like to see him back in 5 weeks to see how is doing overall.  All question concerns were answered and addressed.  I will refill his hydrocodone.

## 2019-05-25 ENCOUNTER — Encounter: Payer: Self-pay | Admitting: Physical Therapy

## 2019-05-25 ENCOUNTER — Ambulatory Visit: Payer: Medicare Other | Attending: Orthopaedic Surgery | Admitting: Physical Therapy

## 2019-05-25 ENCOUNTER — Other Ambulatory Visit: Payer: Self-pay

## 2019-05-25 DIAGNOSIS — M6281 Muscle weakness (generalized): Secondary | ICD-10-CM | POA: Insufficient documentation

## 2019-05-25 DIAGNOSIS — M25612 Stiffness of left shoulder, not elsewhere classified: Secondary | ICD-10-CM

## 2019-05-25 DIAGNOSIS — M25512 Pain in left shoulder: Secondary | ICD-10-CM

## 2019-05-25 NOTE — Therapy (Signed)
[facility] [address]  [place], [place], [address] Phone: [phone]   Fax:  [phone]  Physical Therapy Evaluation  Patient Details  Name: Matthew [name] MRN: [id number] Date of Birth: 1956-04-18 Referring Provider (PT): Jean Rosenthal, MD   Encounter Date: 05/25/2019  PT End of Session - 05/25/19 1148    Visit Number  1    Number of Visits  17    Date for PT Re-Evaluation  07/20/19    Authorization Type  [employer] Medicare    PT Start Time  0854    PT Stop Time  0927    PT Time Calculation (min)  33 min    Equipment Utilized During Treatment  Other (comment)   L shoulder sling   Activity Tolerance  Patient tolerated treatment well;Patient limited by pain    Behavior During Therapy  [facility] for tasks assessed/performed       Past Medical History:  Diagnosis Date  . Anxiety   . Arthritis    KNEES BACK SHOULDERS  . Cancer (HCC)    HX SKIN CANCER  . Hx of nonmelanoma skin cancer   . Hypertension    FOLLOWED BY DR ED GREEN    Past Surgical History:  Procedure Laterality Date  . BACK SURGERY  2010   L5S1 FUSION  . KNEE ARTHROPLASTY  2010   L  . KNEE ARTHROSCOPY     L X 2  . REPLACEMENT TOTAL KNEE Left 11/18/2008  . TOTAL HIP ARTHROPLASTY Right 09/09/2015   Procedure: RIGHT TOTAL HIP ARTHROPLASTY ANTERIOR APPROACH;  Surgeon: Mcarthur Rossetti, MD;  Location: [facility];  Service: Orthopedics;  Laterality: Right;  . TOTAL KNEE ARTHROPLASTY  10/08/2011   Procedure: TOTAL KNEE ARTHROPLASTY;  Surgeon: Rudean Haskell, MD;  Location: [facility];  Service: Orthopedics;  Laterality: Right;  TOTAL RIGHT KNEE ARTHROPLASTY     There were no vitals filed for this visit.   Subjective Assessment - 05/25/19 0857    Subjective  Patient reports undergoing L RTC repair on 05/14/19. Wearing sling when sleeping and 50% of the time when awake. Has been performing exercises as instructed by MD as well as a few of his  own. Reports pain levels have been minimal, but worst in the AM. Would like to work towards full ROM, increase flexibility, and resume golf.    Pertinent History  HTN, hx skin CA, anxiety, TKA 2012, R THA 2016, L TKA 2010, L5-S1 fusion 2010    Limitations  Lifting;House hold activities    Diagnostic tests  03/26/19 L shoulder MRI: Large full-thickness retracted tear of the supraspinatus tendon; Probable avulsion of the long head of the biceps tendon with distal retraction    Patient Stated Goals  resume golf    Currently in Pain?  No/denies    Pain Score  0-No pain    Pain Location  Shoulder    Pain Orientation  Left    Pain Descriptors / Indicators  Aching    Pain Type  Acute pain;Surgical pain         [facility] PT Assessment - 05/25/19 0902      Assessment   Medical Diagnosis  s/p arthroscopy of L shoulder; RTC repair of full-thickness complete tear of supraspinatus    Referring Provider (PT)  Jean Rosenthal, MD    Onset Date/Surgical Date  05/14/19    Hand Dominance  Right    Next MD Visit  06/25/19  Prior Therapy  yes      Precautions   Precautions  --   L arm no WBing, behind the back, AROM; ABD 70 deg, ER 30 deg   Required Braces or Orthoses  Sling   L arm     Balance Screen   Has the patient fallen in the past 6 months  Yes    How many times?  1   1 fall causing RTC tear- fell in the dark   Has the patient had a decrease in activity level because of a fear of falling?   No    Is the patient reluctant to leave their home because of a fear of falling?   No      Home Film/video editor residence      Prior Function   Level of Independence  Independent    Vocation  Part time employment;On disability    Vocation Requirements  lifting boxes- 13-20 lbs    Leisure  golf      Cognition   Overall Cognitive Status  Within Functional Limits for tasks assessed      Observation/Other Assessments   Observations  L shoulder in abduction sling; L  shoulder incisions slightly red but healing well    Focus on Therapeutic Outcomes (FOTO)   next session      Sensation   Light Touch  Appears Intact      Coordination   Gross Motor Movements are Fluid and Coordinated  Yes      Posture/Postural Control   Posture/Postural Control  Postural limitations    Postural Limitations  Rounded Shoulders;Forward head      ROM / Strength   AROM / PROM / Strength  AROM;PROM;Strength      AROM   AROM Assessment Site  Shoulder    Right/Left Shoulder  Right    Right Shoulder Flexion  150 Degrees    Right Shoulder ABduction  125 Degrees    Right Shoulder Internal Rotation  --   FIR T10   Right Shoulder External Rotation  --   FER T3     PROM   PROM Assessment Site  Shoulder    Right/Left Shoulder  Left    Left Shoulder Flexion  109 Degrees    Left Shoulder ABduction  70 Degrees   scaption; limited by protocol   Left Shoulder Internal Rotation  47 Degrees    Left Shoulder External Rotation  0 Degrees      Strength   Strength Assessment Site  Shoulder   mild pain   Right/Left Shoulder  Right    Right Shoulder Flexion  4+/5    Right Shoulder ABduction  4+/5    Right Shoulder Internal Rotation  4+/5    Right Shoulder External Rotation  4/5      Palpation   Palpation comment  TTP and mild soft tissue restriction in proximal L biceps muscle belly                Objective measurements completed on examination: See above findings.              PT Education - 05/25/19 1147    Education Details  prognosis, POC, HEP, edu on post-surgical precautions and sling use    Person(s) Educated  Patient    Methods  Explanation;Demonstration;Tactile cues;Verbal cues;Handout    Comprehension  Verbalized understanding;Returned demonstration       PT Short Term Goals - 05/25/19 1157  PT SHORT TERM GOAL #1   Title  Patient to be independent with initial HEP.    Time  4    Period  Weeks    Status  New    Target Date   06/22/19        PT Long Term Goals - 05/25/19 1157      PT LONG TERM GOAL #1   Title  Patient to be independent with advanced HEP.    Time  8    Period  Weeks    Status  New    Target Date  07/20/19      PT LONG TERM GOAL #2   Title  Patient to demonstrate L shoulder AROM/PROM [facility] and without pain limiting.    Time  8    Period  Weeks    Status  New    Target Date  07/20/19      PT LONG TERM GOAL #3   Title  Patient to demonstrate L shoulder strength >=4+/5.    Time  8    Period  Weeks    Status  New    Target Date  07/20/19      PT LONG TERM GOAL #4   Title  Patient to demonstrate L shoulder elevation to overhead shelf with 5lbs with good scapular mechanics and no evidence of compensations.    Time  8    Period  Weeks    Status  New    Target Date  07/20/19      PT LONG TERM GOAL #5   Title  Patient to demonstrate lifting 10lb box from floor with good body mechanics and no pain.    Time  8    Period  Weeks    Status  New    Target Date  07/20/19             Plan - 05/25/19 1149    Clinical Impression Statement  Patient is a 63y/o M presenting to O[facility] with c/o L shoulder pain s/p L shoulder arthroscopy with repair of full-thickness complete tear of supraspinatus on 05/14/19. Admits to 50% sling compliance when awake and performing MD-administered HEP as well as some of his own. Educated patient on post-surgical precautions and sling use for proper healing. Patient today with limited and mildly painful L shoulder PROM, well-healing incisions, tenderness and mild soft tissue restriction in proximal L biceps muscle belly, and abnormal posture. Educated on pendulum and distal joint ROM HEP- patient reported understanding of all edu. Would benefit from skilled PT services 2x/week for 8 weeks to address aforementioned impairments.    Personal Factors and Comorbidities  Age;Behavior Pattern;Time since onset of injury/illness/exacerbation;Comorbidity 3+;Past/Current  Experience    Comorbidities  HTN, hx skin CA, anxiety, TKA 2012, R THA 2016, L TKA 2010, L5-S1 fusion 2010    Examination-Activity Limitations  Bathing;Sleep;Caring for Others;Carry;Dressing;Transfers;Hygiene/Grooming;Lift;Reach Overhead    Examination-Participation Restrictions  Cleaning;Shop;Community Activity;Driving;Yard Work;Interpersonal Relationship;Laundry;Meal Prep    Stability/Clinical Decision Making  Stable/Uncomplicated    Clinical Decision Making  Low    Rehab Potential  Good    PT Frequency  2x / week    PT Duration  8 weeks    PT Treatment/Interventions  ADLs/Self Care Home Management;Cryotherapy;Electrical Stimulation;Moist Heat;Therapeutic exercise;Therapeutic activities;Functional mobility training;Ultrasound;Neuromuscular re-education;Patient/family education;Manual techniques;Vasopneumatic Device;Taping;Splinting;Energy conservation;Dry needling;Passive range of motion;Scar mobilization    PT Next Visit Plan  reassess HEP, FOTO    Consulted and Agree with Plan of Care  Patient       Patient  will benefit from skilled therapeutic intervention in order to improve the following deficits and impairments:  Hypomobility, Increased edema, Decreased scar mobility, Decreased activity tolerance, Decreased strength, Impaired UE functional use, Pain, Decreased range of motion, Improper body mechanics, Postural dysfunction, Impaired flexibility  Visit Diagnosis: 1. Acute pain of left shoulder   2. Stiffness of left shoulder, not elsewhere classified   3. Muscle weakness (generalized)        Problem List Patient Active Problem List   Diagnosis Date Noted  . Traumatic complete tear of left rotator cuff 05/04/2019  . Lumbar facet arthropathy 10/30/2017  . Osteoarthritis of right hip 09/09/2015  . Status post total replacement of right hip 09/09/2015  . Chest pain 02/05/2014  . Palpitations 02/05/2014  . Dyspnea 02/05/2014  . Dehydration 02/05/2014    Janene Harvey,  PT, DPT 05/25/19 12:04 PM   [facility] [address]  [address] [place], [place], [address] Phone: [phone]   Fax:  [phone]  Name: ARLING CERONE MRN: [id number] Date of Birth: 30-Sep-1956

## 2019-05-27 ENCOUNTER — Ambulatory Visit: Payer: Medicare Other | Admitting: Physical Therapy

## 2019-05-27 ENCOUNTER — Encounter: Payer: Self-pay | Admitting: Physical Therapy

## 2019-05-27 ENCOUNTER — Other Ambulatory Visit: Payer: Self-pay

## 2019-05-27 DIAGNOSIS — M25612 Stiffness of left shoulder, not elsewhere classified: Secondary | ICD-10-CM

## 2019-05-27 DIAGNOSIS — M25512 Pain in left shoulder: Secondary | ICD-10-CM

## 2019-05-27 DIAGNOSIS — M6281 Muscle weakness (generalized): Secondary | ICD-10-CM

## 2019-05-27 NOTE — Therapy (Signed)
[facility] [address]  [place], [place], [address] Phone: [phone]   Fax:  [phone]  Physical Therapy Treatment  Patient Details  Name: CARZELL SALDIVAR MRN: [id number] Date of Birth: 04-10-1956 Referring Provider (PT): Jean Rosenthal, MD   Encounter Date: 05/27/2019  PT End of Session - 05/27/19 1232    Visit Number  2    Number of Visits  17    Date for PT Re-Evaluation  07/20/19    Authorization Type  [employer] Medicare    PT Start Time  0801    PT Stop Time  0855    PT Time Calculation (min)  54 min    Equipment Utilized During Treatment  Other (comment)   L shoulder sling   Activity Tolerance  Patient tolerated treatment well;Patient limited by pain    Behavior During Therapy  [facility] for tasks assessed/performed       Past Medical History:  Diagnosis Date  . Anxiety   . Arthritis    KNEES BACK SHOULDERS  . Cancer (HCC)    HX SKIN CANCER  . Hx of nonmelanoma skin cancer   . Hypertension    FOLLOWED BY DR ED GREEN    Past Surgical History:  Procedure Laterality Date  . BACK SURGERY  2010   L5S1 FUSION  . KNEE ARTHROPLASTY  2010   L  . KNEE ARTHROSCOPY     L X 2  . REPLACEMENT TOTAL KNEE Left 11/18/2008  . TOTAL HIP ARTHROPLASTY Right 09/09/2015   Procedure: RIGHT TOTAL HIP ARTHROPLASTY ANTERIOR APPROACH;  Surgeon: Mcarthur Rossetti, MD;  Location: [facility];  Service: Orthopedics;  Laterality: Right;  . TOTAL KNEE ARTHROPLASTY  10/08/2011   Procedure: TOTAL KNEE ARTHROPLASTY;  Surgeon: Rudean Haskell, MD;  Location: [facility];  Service: Orthopedics;  Laterality: Right;  TOTAL RIGHT KNEE ARTHROPLASTY     There were no vitals filed for this visit.  Subjective Assessment - 05/27/19 0802    Subjective  Reports that he started to feel a bit of achiness in the L upper shoulder/neck. Had trouble sleeping and slept without the sling last night.    Pertinent History  HTN, hx skin CA, anxiety,  TKA 2012, R THA 2016, L TKA 2010, L5-S1 fusion 2010    Diagnostic tests  03/26/19 L shoulder MRI: Large full-thickness retracted tear of the supraspinatus tendon; Probable avulsion of the long head of the biceps tendon with distal retraction    Patient Stated Goals  resume golf    Currently in Pain?  Yes    Pain Score  2     Pain Location  Shoulder    Pain Orientation  Left    Pain Descriptors / Indicators  Aching    Pain Type  Acute pain;Surgical pain         [facility] PT Assessment - 05/27/19 0001      Observation/Other Assessments   Focus on Therapeutic Outcomes (FOTO)   Shoulder: 55 (45% limited, 24% predicted)                   [facility] Adult PT Treatment/Exercise - 05/27/19 0001      Exercises   Exercises  Shoulder;Neck;Wrist      Shoulder Exercises: Seated   Retraction  Strengthening;Both;10 reps    Retraction Limitations  10x3" scapular retraction    Other Seated Exercises  B shoulder circles forward x10, backward x10      Shoulder Exercises:  Standing   Other Standing Exercises  L shoulder pendulums ant/pos, M/L, CW, CCW 30" each      Wrist Exercises   Wrist Flexion  Strengthening;Left;Seated;10 reps    Wrist Flexion Limitations  L shoulder supported wrist flexion    Wrist Extension  Strengthening;Left;10 reps;Seated    Wrist Extension Limitations  L shoulder supported wrist extension      Modalities   Modalities  Vasopneumatic      Vasopneumatic   Number Minutes Vasopneumatic   10 minutes    Vasopnuematic Location   Shoulder   L   Vasopneumatic Pressure  Low    Vasopneumatic Temperature   coldest      Manual Therapy   Manual Therapy  Soft tissue mobilization;Myofascial release;Passive ROM    Manual therapy comments  sitting, supine    Soft tissue mobilization  STM to L UT, LS, scalenes- considerable soft tissue restriction in these areas, worst in UT   avoiding incisions   Myofascial Release  manual TPR to L UT    Passive ROM  L shoulder PROM in  flexion, scaption, ER, IR- kept within pain-free ROM with prolonged holds   most limited in ER     Neck Exercises: Stretches   Upper Trapezius Stretch  Right;Left;1 rep;30 seconds    Upper Trapezius Stretch Limitations  to tolerance    Levator Stretch  Right;Left;1 rep;30 seconds    Levator Stretch Limitations  to tolerance               PT Short Term Goals - 05/27/19 1234      PT SHORT TERM GOAL #1   Title  Patient to be independent with initial HEP.    Time  4    Period  Weeks    Status  On-going    Target Date  06/22/19        PT Long Term Goals - 05/27/19 1234      PT LONG TERM GOAL #1   Title  Patient to be independent with advanced HEP.    Time  8    Period  Weeks    Status  On-going      PT LONG TERM GOAL #2   Title  Patient to demonstrate L shoulder AROM/PROM WFL and without pain limiting.    Time  8    Period  Weeks    Status  On-going      PT LONG TERM GOAL #3   Title  Patient to demonstrate L shoulder strength >=4+/5.    Time  8    Period  Weeks    Status  On-going      PT LONG TERM GOAL #4   Title  Patient to demonstrate L shoulder elevation to overhead shelf with 5lbs with good scapular mechanics and no evidence of compensations.    Time  8    Period  Weeks    Status  On-going      PT LONG TERM GOAL #5   Title  Patient to demonstrate lifting 10lb box from floor with good body mechanics and no pain.    Time  8    Period  Weeks    Status  On-going            Plan - 05/27/19 1233    Clinical Impression Statement  Patient arrived to session with report of difficulty sleeping d/t L shoulder and neck pain. Subsequently slept without sling last night d/t this pain. Reminded patient of post-surgical instructions  and precautions- patient reported understanding. Worked on gentle cervical stretching for pain relief. Tolerated STM and manual TPR to L UT, LS, and scalenes- palpable and tender trigger points observed in UT with patient reporting  good relief after STM. Tolerated gentle pain-free L shoulder PROM in scapular plane. Patient demonstrating improvement in ROM; still most limited in ER. Ended session with Gameready to L shoulder for pain relief. No complaints at end of session.    Comorbidities  HTN, hx skin CA, anxiety, TKA 2012, R THA 2016, L TKA 2010, L5-S1 fusion 2010    Rehab Potential  Good    PT Frequency  2x / week    PT Duration  8 weeks    PT Treatment/Interventions  ADLs/Self Care Home Management;Cryotherapy;Electrical Stimulation;Moist Heat;Therapeutic exercise;Therapeutic activities;Functional mobility training;Ultrasound;Neuromuscular re-education;Patient/family education;Manual techniques;Vasopneumatic Device;Taping;Splinting;Energy conservation;Dry needling;Passive range of motion;Scar mobilization    PT Next Visit Plan  progress PROM    Consulted and Agree with Plan of Care  Patient       Patient will benefit from skilled therapeutic intervention in order to improve the following deficits and impairments:  Hypomobility, Increased edema, Decreased scar mobility, Decreased activity tolerance, Decreased strength, Impaired UE functional use, Pain, Decreased range of motion, Improper body mechanics, Postural dysfunction, Impaired flexibility  Visit Diagnosis: 1. Acute pain of left shoulder   2. Stiffness of left shoulder, not elsewhere classified   3. Muscle weakness (generalized)        Problem List Patient Active Problem List   Diagnosis Date Noted  . Traumatic complete tear of left rotator cuff 05/04/2019  . Lumbar facet arthropathy 10/30/2017  . Osteoarthritis of right hip 09/09/2015  . Status post total replacement of right hip 09/09/2015  . Chest pain 02/05/2014  . Palpitations 02/05/2014  . Dyspnea 02/05/2014  . Dehydration 02/05/2014     Janene Harvey, PT, DPT 05/27/19 12:36 PM   [facility] [address]  [place], [place], [address] Phone: [phone]   Fax:  [phone]  Name: KHAM ZUCKERMAN MRN: [id number] Date of Birth: 1955-12-14

## 2019-06-01 ENCOUNTER — Encounter: Payer: Self-pay | Admitting: Physical Therapy

## 2019-06-01 ENCOUNTER — Ambulatory Visit: Payer: Medicare Other | Admitting: Physical Therapy

## 2019-06-01 ENCOUNTER — Other Ambulatory Visit: Payer: Self-pay

## 2019-06-01 DIAGNOSIS — M6281 Muscle weakness (generalized): Secondary | ICD-10-CM

## 2019-06-01 DIAGNOSIS — M25512 Pain in left shoulder: Secondary | ICD-10-CM | POA: Diagnosis not present

## 2019-06-01 DIAGNOSIS — M25612 Stiffness of left shoulder, not elsewhere classified: Secondary | ICD-10-CM

## 2019-06-01 NOTE — Therapy (Signed)
[facility] [address]  [place], [place], [address] Phone: [phone]   Fax:  [phone]  Physical Therapy Treatment  Patient Details  Name: Matthew [name] MRN: [id number] Date of Birth: [birth year] Referring Provider (PT): Jean Rosenthal, MD   Encounter Date: 06/01/2019  PT End of Session - 06/01/19 0849    Visit Number  3    Number of Visits  17    Date for PT Re-Evaluation  07/20/19    Authorization Type  [employer]HC Medicare    PT Start Time  0802    PT Stop Time  0855    PT Time Calculation (min)  53 min    Equipment Utilized During Treatment  Other (comment)   L shoulder sling   Activity Tolerance  Patient tolerated treatment well;Patient limited by pain    Behavior During Therapy  [facility] for tasks assessed/performed       Past Medical History:  Diagnosis Date  . Anxiety   . Arthritis    KNEES BACK SHOULDERS  . Cancer (HCC)    HX SKIN CANCER  . Hx of nonmelanoma skin cancer   . Hypertension    FOLLOWED BY DR ED GREEN    Past Surgical History:  Procedure Laterality Date  . BACK SURGERY  2010   L5S1 FUSION  . KNEE ARTHROPLASTY  2010   L  . KNEE ARTHROSCOPY     L X 2  . REPLACEMENT TOTAL KNEE Left 11/18/2008  . TOTAL HIP ARTHROPLASTY Right 09/09/2015   Procedure: RIGHT TOTAL HIP ARTHROPLASTY ANTERIOR APPROACH;  Surgeon: Mcarthur Rossetti, MD;  Location: [facility];  Service: Orthopedics;  Laterality: Right;  . TOTAL KNEE ARTHROPLASTY  10/08/2011   Procedure: TOTAL KNEE ARTHROPLASTY;  Surgeon: Rudean Haskell, MD;  Location: [facility];  Service: Orthopedics;  Laterality: Right;  TOTAL RIGHT KNEE ARTHROPLASTY     There were no vitals filed for this visit.  Subjective Assessment - 06/01/19 0802    Subjective  Reports that he is still having a little neck strain but shoulder is doing well.    Pertinent History  HTN, hx skin CA, anxiety, TKA 2012, R THA 2016, L TKA 2010, L5-S1 fusion 2010    Diagnostic tests  03/26/19 L shoulder MRI: Large full-thickness retracted tear of the supraspinatus tendon; Probable avulsion of the long head of the biceps tendon with distal retraction    Patient Stated Goals  resume golf    Currently in Pain?  No/denies                       [facility] Adult PT Treatment/Exercise - 06/01/19 0001      Neck Exercises: Seated   Neck Retraction  10 reps;3 secs    Neck Retraction Limitations  cues to avoid shoulder elevation      Shoulder Exercises: Supine   Flexion  PROM;Left    Flexion Limitations  L shoulder flexion PROM to tolerance using R UE assistance; 2 reps   cues for control     Shoulder Exercises: Seated   Retraction  Strengthening;Both;10 reps    Retraction Limitations  10x3" scapular retraction      Shoulder Exercises: Standing   Other Standing Exercises  L shoulder pendulums ant/pos, M/L, CW, CCW 30" each      Wrist Exercises   Wrist Flexion  Strengthening;Left;Seated;10 reps    Wrist Flexion Limitations  L shoulder supported wrist flexion 1#  Wrist Extension  Strengthening;Left;10 reps;Seated    Wrist Extension Limitations  L shoulder supported wrist extension 1#      Vasopneumatic   Number Minutes Vasopneumatic   10 minutes    Vasopnuematic Location   Shoulder   L   Vasopneumatic Pressure  Low    Vasopneumatic Temperature   coldest      Manual Therapy   Manual Therapy  Soft tissue mobilization;Myofascial release;Passive ROM    Manual therapy comments  sitting, supine    Soft tissue mobilization  STM to L UT, LS, scalenes- considerable soft tissue restriction and visible increase in tone in these areas, worst in UT    Myofascial Release  manual TPR to L UT    Passive ROM  L shoulder PROM in flexion, scaption, ER, IR- kept within pain-free ROM with prolonged holds      Neck Exercises: Stretches   Upper Trapezius Stretch  Right;Left;30 seconds;2 reps    Upper Trapezius Stretch Limitations  to tolerance    Levator  Stretch  Right;Left;30 seconds;2 reps    Levator Stretch Limitations  to tolerance   cues for shoulder depression            PT Education - 06/01/19 0849    Education Details  update to HEP    Person(s) Educated  Patient    Methods  Explanation;Demonstration;Tactile cues;Verbal cues;Handout    Comprehension  Verbalized understanding;Returned demonstration       PT Short Term Goals - 05/27/19 1234      PT SHORT TERM GOAL #1   Title  Patient to be independent with initial HEP.    Time  4    Period  Weeks    Status  On-going    Target Date  06/22/19        PT Long Term Goals - 05/27/19 1234      PT LONG TERM GOAL #1   Title  Patient to be independent with advanced HEP.    Time  8    Period  Weeks    Status  On-going      PT LONG TERM GOAL #2   Title  Patient to demonstrate L shoulder AROM/PROM WFL and without pain limiting.    Time  8    Period  Weeks    Status  On-going      PT LONG TERM GOAL #3   Title  Patient to demonstrate L shoulder strength >=4+/5.    Time  8    Period  Weeks    Status  On-going      PT LONG TERM GOAL #4   Title  Patient to demonstrate L shoulder elevation to overhead shelf with 5lbs with good scapular mechanics and no evidence of compensations.    Time  8    Period  Weeks    Status  On-going      PT LONG TERM GOAL #5   Title  Patient to demonstrate lifting 10lb box from floor with good body mechanics and no pain.    Time  8    Period  Weeks    Status  On-going            Plan - 06/01/19 0849    Clinical Impression Statement  Patient reporting mild neck "strain" since last session but L shoulder overall doing well. Tolerated gentle cervical stretching and postural correction ther-ex well. Patient intermittently requiring cues to correction shoulder elevation during exercises today. Able to perform L wrist flexion/extension with  light weighted resistance with L shoulder supported. Patient still demonstrating increase in soft  tissue restriction in L UT, LS, and scalenes as well as visible increase in tone in UT. Patient reported relief with STM and TPR. Tolerated L shoulder PROM with therapist and self-PROM into flexion into pain-free ranges. Ended session with Gameready to L shoulder for pain relief. Updated HEP with PROM flexion and advised patient to increase PM compliance with sling to avoid injury. Patient reported understanding and with no complaints at end of session.    Comorbidities  HTN, hx skin CA, anxiety, TKA 2012, R THA 2016, L TKA 2010, L5-S1 fusion 2010    Rehab Potential  Good    PT Frequency  2x / week    PT Duration  8 weeks    PT Treatment/Interventions  ADLs/Self Care Home Management;Cryotherapy;Electrical Stimulation;Moist Heat;Therapeutic exercise;Therapeutic activities;Functional mobility training;Ultrasound;Neuromuscular re-education;Patient/family education;Manual techniques;Vasopneumatic Device;Taping;Splinting;Energy conservation;Dry needling;Passive range of motion;Scar mobilization    PT Next Visit Plan  progress PROM    Consulted and Agree with Plan of Care  Patient       Patient will benefit from skilled therapeutic intervention in order to improve the following deficits and impairments:  Hypomobility, Increased edema, Decreased scar mobility, Decreased activity tolerance, Decreased strength, Impaired UE functional use, Pain, Decreased range of motion, Improper body mechanics, Postural dysfunction, Impaired flexibility  Visit Diagnosis: 1. Acute pain of left shoulder   2. Stiffness of left shoulder, not elsewhere classified   3. Muscle weakness (generalized)        Problem List Patient Active Problem List   Diagnosis Date Noted  . Traumatic complete tear of left rotator cuff 05/04/2019  . Lumbar facet arthropathy 10/30/2017  . Osteoarthritis of right hip 09/09/2015  . Status post total replacement of right hip 09/09/2015  . Chest pain 02/05/2014  . Palpitations 02/05/2014  .  Dyspnea 02/05/2014  . Dehydration 02/05/2014    Janene Harvey, PT, DPT 06/01/19 8:59 AM    [facility] [place], [place], [address] Phone: [phone]   Fax:  [phone]  Name: Matthew [name] MRN: [id number] Date of Birth: 1956-09-25

## 2019-06-02 ENCOUNTER — Other Ambulatory Visit: Payer: Self-pay

## 2019-06-02 ENCOUNTER — Ambulatory Visit
Admission: RE | Admit: 2019-06-02 | Discharge: 2019-06-02 | Disposition: A | Discharge status: Home / Self Care | Payer: Medicare Other | Source: Ambulatory Visit | Attending: Neurological Surgery | Admitting: Neurological Surgery

## 2019-06-02 DIAGNOSIS — M48061 Spinal stenosis, lumbar region without neurogenic claudication: Secondary | ICD-10-CM

## 2019-06-02 MED ORDER — GADOBENATE DIMEGLUMINE 529 MG/ML IV SOLN
20.0000 mL | Freq: Once | INTRAVENOUS | Status: AC | PRN
  Administered 2019-06-02: 20 mL via INTRAVENOUS

## 2019-06-03 ENCOUNTER — Encounter: Payer: Self-pay | Admitting: Physical Therapy

## 2019-06-03 ENCOUNTER — Ambulatory Visit: Payer: Medicare Other | Admitting: Physical Therapy

## 2019-06-03 DIAGNOSIS — M6281 Muscle weakness (generalized): Secondary | ICD-10-CM

## 2019-06-03 DIAGNOSIS — M25512 Pain in left shoulder: Secondary | ICD-10-CM | POA: Diagnosis not present

## 2019-06-03 DIAGNOSIS — M25612 Stiffness of left shoulder, not elsewhere classified: Secondary | ICD-10-CM

## 2019-06-03 NOTE — Therapy (Signed)
[facility] [address]  [place], [place], [address] Phone: [phone]   Fax:  [phone]  Physical Therapy Treatment  Patient Details  Name: Matthew [name] MRN: [id number] Date of Birth: 06/27/1956 Referring Provider (PT): Jean Rosenthal, MD   Encounter Date: 06/03/2019  PT End of Session - 06/03/19 0842    Visit Number  4    Number of Visits  17    Date for PT Re-Evaluation  07/20/19    Authorization Type  [employer]HC Medicare    PT Start Time  0803    PT Stop Time  0856    PT Time Calculation (min)  53 min    Equipment Utilized During Treatment  Other (comment)   L shoulder sling   Activity Tolerance  Patient tolerated treatment well    Behavior During Therapy  [facility] for tasks assessed/performed       Past Medical History:  Diagnosis Date  . Anxiety   . Arthritis    KNEES BACK SHOULDERS  . Cancer (HCC)    HX SKIN CANCER  . Hx of nonmelanoma skin cancer   . Hypertension    FOLLOWED BY DR ED GREEN    Past Surgical History:  Procedure Laterality Date  . BACK SURGERY  2010   L5S1 FUSION  . KNEE ARTHROPLASTY  2010   L  . KNEE ARTHROSCOPY     L X 2  . REPLACEMENT TOTAL KNEE Left 11/18/2008  . TOTAL HIP ARTHROPLASTY Right 09/09/2015   Procedure: RIGHT TOTAL HIP ARTHROPLASTY ANTERIOR APPROACH;  Surgeon: Mcarthur Rossetti, MD;  Location: [facility];  Service: Orthopedics;  Laterality: Right;  . TOTAL KNEE ARTHROPLASTY  10/08/2011   Procedure: TOTAL KNEE ARTHROPLASTY;  Surgeon: Rudean Haskell, MD;  Location: [facility];  Service: Orthopedics;  Laterality: Right;  TOTAL RIGHT KNEE ARTHROPLASTY     There were no vitals filed for this visit.  Subjective Assessment - 06/03/19 0804    Subjective  Reports that both of his shoulders were a little achey after his MRI.    Pertinent History  HTN, hx skin CA, anxiety, TKA 2012, R THA 2016, L TKA 2010, L5-S1 fusion 2010    Diagnostic tests  03/26/19 L shoulder  MRI: Large full-thickness retracted tear of the supraspinatus tendon; Probable avulsion of the long head of the biceps tendon with distal retraction    Patient Stated Goals  resume golf    Currently in Pain?  No/denies                       [facility] Adult PT Treatment/Exercise - 06/03/19 0001      Neck Exercises: Seated   Neck Retraction  10 reps;3 secs    Neck Retraction Limitations  2nd set with yellow TB with therapist assistance on L; cues to avoid shoulder elevation      Shoulder Exercises: Supine   Flexion  PROM;Left;5 reps    Flexion Limitations  L shoulder flexion PROM to tolerance using R UE assistance    Other Supine Exercises  L shoulder flexion AAROM with wand and 2 pillows under L UE x10      Shoulder Exercises: Standing   Other Standing Exercises  leaning over counter L UE prone row to neutral x10; prone extension to neutral x10   cues for scap squeeze   Other Standing Exercises  L shoulder pendulums ant/pos, M/L, CW, CCW 30" each  Vasopneumatic   Number Minutes Vasopneumatic   15 minutes    Vasopnuematic Location   Shoulder   L   Vasopneumatic Pressure  Low    Vasopneumatic Temperature   coldest      Manual Therapy   Manual Therapy  Joint mobilization;Passive ROM    Manual therapy comments  supine    Joint Mobilization  L shoulder anterior, posterior, inferior jt mobs grade III; gentle distraction grade III    Passive ROM  L shoulder PROM in flexion, scaption, ER, IR- kept within pain-free ROM with prolonged holds   good improvement in ER            PT Education - 06/03/19 0842    Education Details  update to HEP    Person(s) Educated  Patient    Methods  Explanation;Demonstration;Tactile cues;Verbal cues;Handout    Comprehension  Verbalized understanding;Returned demonstration       PT Short Term Goals - 06/03/19 0848      PT SHORT TERM GOAL #1   Title  Patient to be independent with initial HEP.    Time  4    Period  Weeks     Status  Achieved    Target Date  06/22/19        PT Long Term Goals - 05/27/19 1234      PT LONG TERM GOAL #1   Title  Patient to be independent with advanced HEP.    Time  8    Period  Weeks    Status  On-going      PT LONG TERM GOAL #2   Title  Patient to demonstrate L shoulder AROM/PROM WFL and without pain limiting.    Time  8    Period  Weeks    Status  On-going      PT LONG TERM GOAL #3   Title  Patient to demonstrate L shoulder strength >=4+/5.    Time  8    Period  Weeks    Status  On-going      PT LONG TERM GOAL #4   Title  Patient to demonstrate L shoulder elevation to overhead shelf with 5lbs with good scapular mechanics and no evidence of compensations.    Time  8    Period  Weeks    Status  On-going      PT LONG TERM GOAL #5   Title  Patient to demonstrate lifting 10lb box from floor with good body mechanics and no pain.    Time  8    Period  Weeks    Status  On-going            Plan - 06/03/19 0843    Clinical Impression Statement  Patient reporting mild "achiness" in B shoulders after his lumbar MRI yesterday. Tolerated addition of light banded resistance with cervical retractions- patient with improved form with addition of resistance. Tolerated manual therapy to L shoulder including PROM and gentle joint mobilizations. Patient demonstrating improvement in ER PROM, which is his most limited plane of motion. Initiated L shoulder AAROM flexion with wand with 2 pillows under L shoulder for improved tolerance. Added this exercise into HEP as patient will be out of town next week- patient reported understanding. Ended session with Gameready to L shoulder for pain relief. No complaints at end of session. Patient progressing towards goals.    Comorbidities  HTN, hx skin CA, anxiety, TKA 2012, R THA 2016, L TKA 2010, L5-S1 fusion 2010  PT Treatment/Interventions  ADLs/Self Care Home Management;Cryotherapy;Electrical Stimulation;Moist Heat;Therapeutic  exercise;Therapeutic activities;Functional mobility training;Ultrasound;Neuromuscular re-education;Patient/family education;Manual techniques;Vasopneumatic Device;Taping;Splinting;Energy conservation;Dry needling;Passive range of motion;Scar mobilization    PT Next Visit Plan  progress PROM/AAROM    Consulted and Agree with Plan of Care  Patient       Patient will benefit from skilled therapeutic intervention in order to improve the following deficits and impairments:  Hypomobility, Increased edema, Decreased scar mobility, Decreased activity tolerance, Decreased strength, Impaired UE functional use, Pain, Decreased range of motion, Improper body mechanics, Postural dysfunction, Impaired flexibility  Visit Diagnosis: 1. Acute pain of left shoulder   2. Stiffness of left shoulder, not elsewhere classified   3. Muscle weakness (generalized)        Problem List Patient Active Problem List   Diagnosis Date Noted  . Traumatic complete tear of left rotator cuff 05/04/2019  . Lumbar facet arthropathy 10/30/2017  . Osteoarthritis of right hip 09/09/2015  . Status post total replacement of right hip 09/09/2015  . Chest pain 02/05/2014  . Palpitations 02/05/2014  . Dyspnea 02/05/2014  . Dehydration 02/05/2014    Janene Harvey, PT, DPT 06/03/19 8:59 AM   [facility] [place], [place], [address] Phone: [phone]   Fax:  [phone]  Name: Matthew [name] MRN: [id number] Date of Birth: 02/13/1956

## 2019-06-10 ENCOUNTER — Other Ambulatory Visit: Payer: Self-pay | Admitting: Neurological Surgery

## 2019-06-12 NOTE — Pre-Procedure Instructions (Signed)
Matthew [name]  06/12/2019      [facility] - [place], [place] - [address] [place] [address] Phone: [phone] Fax: [phone]  [facility] [address] [place], [place]. [place]. [place] [address] Phone: [phone] Fax: [phone]    Your procedure is scheduled on June 18, 2019.  Report to [facility] Admitting at 530 AM.  Call this number if you have problems the morning of surgery:  [phone]   Call [phone] if you have any questions prior to your surgery date Monday-Friday 8am-4pm    Remember:  Do not eat or drink after midnight.     Take these medicines the morning of surgery with A SIP OF WATER  Hydrocodone-acetaminophen (norco)-if needed for pain  Follow your surgeon's instructions on when to hold/resume aspirin.  If no instructions were given call the office to determine how they would like to you take aspirin  Beginning now, STOP taking any Celebrex, Aleve, Naproxen, Ibuprofen, Motrin, Advil, Goody's, BC's, all herbal medications, fish oil, and all vitamins    Day of surgery:  Do not wear jewelry  Do not wear lotions, powders, or colognes, or deodorant.  Men may shave face and neck.  Do not bring valuables to the hospital.   [facility] is not responsible for any belongings or valuables.  IF you are a smoker, DO NOT Smoke 24 hours prior to surgery   IF you wear a CPAP at night please bring your mask, tubing, and machine the morning of surgery    Remember that you must have someone to transport you home after your surgery, and remain with you for 24 hours if you are discharged the same day.  Contacts, dentures or bridgework may not be worn into surgery.  Leave your suitcase in the car.  After surgery it may be brought to your room.  For patients admitted to the hospital, discharge time will be determined by your treatment team.  Patients  discharged the day of surgery will not be allowed to drive home.    [facility]- Preparing For Surgery  Before surgery, you can play an important role. Because skin is not sterile, your skin needs to be as free of germs as possible. You can reduce the number of germs on your skin by washing with CHG (chlorahexidine gluconate) Soap before surgery.  CHG is an antiseptic cleaner which kills germs and bonds with the skin to continue killing germs even after washing.    Oral Hygiene is also important to reduce your risk of infection.  Remember - BRUSH YOUR TEETH THE MORNING OF SURGERY WITH YOUR REGULAR TOOTHPASTE  Please do not use if you have an allergy to CHG or antibacterial soaps. If your skin becomes reddened/irritated stop using the CHG.  Do not shave (including legs and underarms) for at least 48 hours prior to first CHG shower. It is OK to shave your face.  Please follow these instructions carefully.   1. Shower the NIGHT BEFORE SURGERY and the MORNING OF SURGERY with CHG.   2. If you chose to wash your hair, wash your hair first as usual with your normal shampoo.  3. After you shampoo, rinse your hair and body thoroughly to remove the shampoo.  4. Use CHG as you would any other liquid soap. You can apply CHG directly to the skin and wash gently with a scrungie or  a clean washcloth.   5. Apply the CHG Soap to your body ONLY FROM THE NECK DOWN.  Do not use on open wounds or open sores. Avoid contact with your eyes, ears, mouth and genitals (private parts). Wash Face and genitals (private parts)  with your normal soap.  6. Wash thoroughly, paying special attention to the area where your surgery will be performed.  7. Thoroughly rinse your body with warm water from the neck down.  8. DO NOT shower/wash with your normal soap after using and rinsing off the CHG Soap.  9. Pat yourself dry with a CLEAN TOWEL.  10. Wear CLEAN PAJAMAS to bed the night before surgery, wear comfortable  clothes the morning of surgery  11. Place CLEAN SHEETS on your bed the night of your first shower and DO NOT SLEEP WITH PETS.  Day of Surgery:  Do not apply any deodorants/lotions.  Please wear clean clothes to the hospital/surgery center.   Remember to brush your teeth WITH YOUR REGULAR TOOTHPASTE.  Please read over the following fact sheets that you were given.

## 2019-06-15 ENCOUNTER — Encounter (HOSPITAL_COMMUNITY): Payer: Self-pay

## 2019-06-15 ENCOUNTER — Encounter (HOSPITAL_COMMUNITY)
Admission: RE | Admit: 2019-06-15 | Discharge: 2019-06-15 | Disposition: A | Discharge status: Home / Self Care | Payer: Medicare Other | Source: Ambulatory Visit | Attending: Neurological Surgery | Admitting: Neurological Surgery

## 2019-06-15 ENCOUNTER — Other Ambulatory Visit: Payer: Self-pay

## 2019-06-15 ENCOUNTER — Ambulatory Visit: Payer: Medicare Other | Attending: Orthopaedic Surgery | Admitting: Physical Therapy

## 2019-06-15 ENCOUNTER — Encounter: Payer: Self-pay | Admitting: Physical Therapy

## 2019-06-15 ENCOUNTER — Other Ambulatory Visit (HOSPITAL_COMMUNITY)
Admission: RE | Admit: 2019-06-15 | Discharge: 2019-06-15 | Disposition: A | Discharge status: Home / Self Care | Payer: Medicare Other | Source: Ambulatory Visit | Attending: Neurological Surgery | Admitting: Neurological Surgery

## 2019-06-15 DIAGNOSIS — M25612 Stiffness of left shoulder, not elsewhere classified: Secondary | ICD-10-CM | POA: Insufficient documentation

## 2019-06-15 DIAGNOSIS — M6281 Muscle weakness (generalized): Secondary | ICD-10-CM

## 2019-06-15 DIAGNOSIS — Z01818 Encounter for other preprocedural examination: Secondary | ICD-10-CM | POA: Insufficient documentation

## 2019-06-15 DIAGNOSIS — M25512 Pain in left shoulder: Secondary | ICD-10-CM | POA: Insufficient documentation

## 2019-06-15 DIAGNOSIS — Z20828 Contact with and (suspected) exposure to other viral communicable diseases: Secondary | ICD-10-CM | POA: Insufficient documentation

## 2019-06-15 DIAGNOSIS — I10 Essential (primary) hypertension: Secondary | ICD-10-CM | POA: Insufficient documentation

## 2019-06-15 HISTORY — DX: Cardiac murmur, unspecified: R01.1

## 2019-06-15 LAB — TYPE AND SCREEN
ABO/RH(D): O POS
Antibody Screen: NEGATIVE

## 2019-06-15 LAB — BASIC METABOLIC PANEL
Anion gap: 9 (ref 5–15)
BUN: 23 mg/dL (ref 8–23)
CO2: 24 mmol/L (ref 22–32)
Calcium: 9.2 mg/dL (ref 8.9–10.3)
Chloride: 106 mmol/L (ref 98–111)
Creatinine, Ser: 0.78 mg/dL (ref 0.61–1.24)
GFR calc Af Amer: >60 mL/min (ref >60)
GFR calc non Af Amer: >60 mL/min (ref >60)
Glucose, Bld: 97 mg/dL (ref 70–99)
Potassium: 3.9 mmol/L (ref 3.5–5.1)
Sodium: 139 mmol/L (ref 135–145)

## 2019-06-15 LAB — CBC
HCT: 48 % (ref 39.0–52.0)
Hemoglobin: 15.9 g/dL (ref 13.0–17.0)
MCH: 32.3 pg (ref 26.0–34.0)
MCHC: 33.1 g/dL (ref 30.0–36.0)
MCV: 97.4 fL (ref 80.0–100.0)
Platelets: 188 10*3/uL (ref 150–400)
RBC: 4.93 MIL/uL (ref 4.22–5.81)
RDW: 11.9 % (ref 11.5–15.5)
WBC: 3.3 10*3/uL — ABNORMAL LOW (ref 4.0–10.5)
nRBC: 0 % (ref 0.0–0.2)

## 2019-06-15 LAB — SURGICAL PCR SCREEN
MRSA, PCR: NEGATIVE
Staphylococcus aureus: NEGATIVE

## 2019-06-15 LAB — SARS CORONAVIRUS 2 (TAT 6-24 HRS): SARS Coronavirus 2: NEGATIVE

## 2019-06-15 NOTE — Therapy (Signed)
[facility] [address]  [place], [place], [address] Phone: [phone]   Fax:  [phone]  Physical Therapy Treatment  Patient Details  Name: WADSWORTH SKOLNICK MRN: [id number] Date of Birth: [birth year] Referring Provider (PT): Jean Rosenthal, MD   Encounter Date: 06/15/2019  PT End of Session - 06/15/19 1213    Visit Number  5    Number of Visits  17    Date for PT Re-Evaluation  07/20/19    Authorization Type  [employer] Medicare    PT Start Time  0804    PT Stop Time  0852    PT Time Calculation (min)  48 min    Activity Tolerance  Patient tolerated treatment well;Patient limited by pain    Behavior During Therapy  [facility] for tasks assessed/performed       Past Medical History:  Diagnosis Date  . Anxiety   . Arthritis    KNEES BACK SHOULDERS  . Cancer (HCC)    HX SKIN CANCER  . Family history of adverse reaction to anesthesia    Mother slow to awaken  . Heart murmur    "nothing to be concerned with, I ve had it all my life."  . Hx of nonmelanoma skin cancer   . Hypertension    FOLLOWED BY DR ED GREEN    Past Surgical History:  Procedure Laterality Date  . BACK SURGERY  2010   L5S1 FUSION  . COLONOSCOPY  05/13/2019  . KNEE ARTHROPLASTY  2010   L  . KNEE ARTHROSCOPY     L X 2  . REPLACEMENT TOTAL KNEE Left 11/18/2008  . SHOULDER ARTHROSCOPY Left 05/14/2019  . TOTAL HIP ARTHROPLASTY Right 09/09/2015   Procedure: RIGHT TOTAL HIP ARTHROPLASTY ANTERIOR APPROACH;  Surgeon: Mcarthur Rossetti, MD;  Location: [facility];  Service: Orthopedics;  Laterality: Right;  . TOTAL KNEE ARTHROPLASTY  10/08/2011   Procedure: rightTOTAL KNEE ARTHROPLASTY;  Surgeon: Rudean Haskell, MD;  Location: [facility];  Service: Orthopedics;  Laterality: Right;  TOTAL RIGHT KNEE ARTHROPLASTY     There were no vitals filed for this visit.  Subjective Assessment - 06/15/19 0804    Subjective  Presenting without sling. Reporting  that he is feeling good and has been practicing wand exercise. Having back surgery on Thursday.    Pertinent History  HTN, hx skin CA, anxiety, TKA 2012, R THA 2016, L TKA 2010, L5-S1 fusion 2010    Diagnostic tests  03/26/19 L shoulder MRI: Large full-thickness retracted tear of the supraspinatus tendon; Probable avulsion of the long head of the biceps tendon with distal retraction    Patient Stated Goals  resume golf    Currently in Pain?  No/denies                       [facility] Adult PT Treatment/Exercise - 06/15/19 0001      Shoulder Exercises: Supine   Protraction  AAROM;Left;10 reps    Protraction Limitations  2x10; with wand   manual guidance   Flexion  AAROM;Left;10 reps    Flexion Limitations  with wand to tolerance   cues to slow down     Shoulder Exercises: Standing   External Rotation  Strengthening;Left;10 reps    Theraband Level (Shoulder External Rotation)  Level 1 (Yellow)    External Rotation Limitations  isometric stepouts with towel roll under elbow    Internal Rotation  Strengthening;Left;10 reps  Internal Rotation Limitations  isometric stepouts with towel roll under elbow    Extension  AROM;Left;10 reps    Extension Limitations  stopping at neutral; leaning over counter top   good scap retraction   Row  AROM;Left;10 reps    Row Limitations  stopping at neutral; leaning over counter top   cues for scap retraction and depression   Other Standing Exercises  L shoulder pendulums ant/pos, M/L, CW, CCW 30" each      Shoulder Exercises: Isometric Strengthening   Flexion  5X10"    Flexion Limitations  towel roll under L elbow; 30% contraction    Extension  5X10"    Extension Limitations  towel roll behind L elbow; 30% contraction      Vasopneumatic   Number Minutes Vasopneumatic   10 minutes    Vasopnuematic Location   Shoulder   L   Vasopneumatic Pressure  Low    Vasopneumatic Temperature   coldest      Manual Therapy   Manual Therapy   Passive ROM    Manual therapy comments  supine    Joint Mobilization  L shoulder distraction grade III for improved comfort with PROM    Passive ROM  L shoulder PROM in flexion, scaption, ER, IR- kept within pain-free ROM with prolonged holds             PT Education - 06/15/19 0844    Education Details  update to HEP; edu on exercise positions to avoid s/p lumbar fusion    Person(s) Educated  Patient    Methods  Explanation;Demonstration;Tactile cues;Verbal cues;Handout    Comprehension  Verbalized understanding;Returned demonstration       PT Short Term Goals - 06/03/19 0848      PT SHORT TERM GOAL #1   Title  Patient to be independent with initial HEP.    Time  4    Period  Weeks    Status  Achieved    Target Date  06/22/19        PT Long Term Goals - 05/27/19 1234      PT LONG TERM GOAL #1   Title  Patient to be independent with advanced HEP.    Time  8    Period  Weeks    Status  On-going      PT LONG TERM GOAL #2   Title  Patient to demonstrate L shoulder AROM/PROM WFL and without pain limiting.    Time  8    Period  Weeks    Status  On-going      PT LONG TERM GOAL #3   Title  Patient to demonstrate L shoulder strength >=4+/5.    Time  8    Period  Weeks    Status  On-going      PT LONG TERM GOAL #4   Title  Patient to demonstrate L shoulder elevation to overhead shelf with 5lbs with good scapular mechanics and no evidence of compensations.    Time  8    Period  Weeks    Status  On-going      PT LONG TERM GOAL #5   Title  Patient to demonstrate lifting 10lb box from floor with good body mechanics and no pain.    Time  8    Period  Weeks    Status  On-going            Plan - 06/15/19 1213    Clinical Impression Statement  Patient arrived to  session without sling, reporting that he had self-discharged the sling long ago. Scheduled for a lumbar fusion on 06/18/19, thus will be taking a short break from therapy to recover from this.  Demonstrated good ROM with L shoulder AAROM flexion; requiring minor cues for speed. Also demonstrating good PROM improvements with gentle distraction for comfort. Introduced periscapular strengthening and light isometrics with L shoulder in neutral. Patient tolerating this well. Most observed difficulty with isometric ER stepouts d/t weakness. Ended session with [employer] to L shoulder for post exercise soreness. Patient without complaints at end of session.    Comorbidities  HTN, hx skin CA, anxiety, TKA 2012, R THA 2016, L TKA 2010, L5-S1 fusion 2010    PT Treatment/Interventions  ADLs/Self Care Home Management;Cryotherapy;Electrical Stimulation;Moist Heat;Therapeutic exercise;Therapeutic activities;Functional mobility training;Ultrasound;Neuromuscular re-education;Patient/family education;Manual techniques;Vasopneumatic Device;Taping;Splinting;Energy conservation;Dry needling;Passive range of motion;Scar mobilization    PT Next Visit Plan  progress PROM/AAROM    Consulted and Agree with Plan of Care  Patient       Patient will benefit from skilled therapeutic intervention in order to improve the following deficits and impairments:  Hypomobility, Increased edema, Decreased scar mobility, Decreased activity tolerance, Decreased strength, Impaired UE functional use, Pain, Decreased range of motion, Improper body mechanics, Postural dysfunction, Impaired flexibility  Visit Diagnosis: 1. Acute pain of left shoulder   2. Stiffness of left shoulder, not elsewhere classified   3. Muscle weakness (generalized)        Problem List Patient Active Problem List   Diagnosis Date Noted  . Traumatic complete tear of left rotator cuff 05/04/2019  . Lumbar facet arthropathy 10/30/2017  . Osteoarthritis of right hip 09/09/2015  . Status post total replacement of right hip 09/09/2015  . Chest pain 02/05/2014  . Palpitations 02/05/2014  . Dyspnea 02/05/2014  . Dehydration 02/05/2014    Janene Harvey, PT, DPT 06/15/19 12:16 PM    [facility] [address]  [place], [place], [address] Phone: [phone]   Fax:  [phone]  Name: BRYANT SAYE MRN: [id number] Date of Birth: 20-Oct-1956

## 2019-06-15 NOTE — Pre-Procedure Instructions (Signed)
[place]  06/15/2019      Your procedure is scheduled on June 18, 2019.  Report to [facility] Admitting at 17 AM.             Your surgery or procedure is scheduled for 7:30 AM pre- surgery desk  Call this number if you have problems the morning of surgery: For any other questions, please call [phone], Monday - Friday 8 AM - 4 PM.  [phone]   Call [phone] if you have any questions prior to your surgery date Monday-Friday 8am-4pm    Remember:  Do not eat or drink after midnight Wednesday, August 5.     Take these medicines the morning of surgery with A SIP OF WATER : Hydrocodone-acetaminophen (norco)-if needed for pain  Follow your surgeon's instructions on when to hold/resume aspirin.  If no instructions were given call the office to determine how they would like to you take aspirin  Beginning now, STOP taking any Celebrex, Aleve, Naproxen, Ibuprofen, Motrin, Advil, Goody's, BC's, all herbal medications, fish oil, and all vitamins  prior to surgery   [place]- Preparing For Surgery  Before surgery, you can play an important role. Because skin is not sterile, your skin needs to be as free of germs as possible. You can reduce the number of germs on your skin by washing with CHG (chlorahexidine gluconate) Soap before surgery.  CHG is an antiseptic cleaner which kills germs and bonds with the skin to continue killing germs even after washing.    Oral Hygiene is also important to reduce your risk of infection.  Remember - BRUSH YOUR TEETH THE MORNING OF SURGERY WITH YOUR REGULAR TOOTHPASTE  Please do not use if you have an allergy to CHG or antibacterial soaps. If your skin becomes reddened/irritated stop using the CHG.  Do not shave (including legs and underarms) for at least 48 hours prior to first CHG shower. It is OK to shave your face.  Please follow these instructions carefully.   1. Shower the NIGHT BEFORE SURGERY and the MORNING OF  SURGERY with CHG.   2. If you chose to wash your hair, wash your hair first as usual with your normal shampoo.  3. After you shampoo, wash your face and private area with the soap you use at home, then rinse your hair and body thoroughly to remove the shampoo and soap.  4. Use CHG as you would any other liquid soap. You can apply CHG directly to the skin and wash gently with a scrungie or a clean washcloth.   Apply the CHG Soap to your body ONLY FROM THE NECK DOWN.  Do not use on open wounds or open sores. Avoid contact with your eyes, ears, mouth and genitals (private parts).    5. Wash thoroughly, paying special attention to the area where your surgery will be performed.  6. Thoroughly rinse your body with warm water from the neck down.  7. DO NOT shower/wash with your normal soap after using and rinsing off the CHG Soap.  8. Pat yourself dry with a CLEAN TOWEL.  9. Wear CLEAN PAJAMAS to bed the night before surgery, wear comfortable clothes the morning of surgery  10. Place CLEAN SHEETS on your bed the night of your first shower and DO NOT SLEEP WITH PETS.  Day of Surgery: Do not wear lotions, powders, or colognes, or deodorant. Do not apply any deodorants/lotions.  Please wear clean clothes to the hospital/surgery center.  Remember to brush your teeth WITH YOUR REGULAR TOOTHPASTE. Day of surgery:  Do not wear jewelry  Do not wear lotions, powders, or colognes, or deodorant.  Men may shave face and neck.  Do not bring valuables to the hospital.   [facility] is not responsible for any belongings or valuables.  IF you are a smoker, DO NOT Smoke 24 hours prior to surgery   IF you wear a CPAP at night please bring your mask, tubing, and machine the morning of surgery    Remember that you must have someone to transport you home after your surgery, and remain with you for 24 hours if you are discharged the same day.  Contacts, dentures or bridgework may not be worn into  surgery.  Leave your suitcase in the car.  After surgery it may be brought to your room.  For patients admitted to the hospital, discharge time will be determined by your treatment team. Patients discharged the day of surgery will not be allowed to drive home.   Please read over the following fact  sheets that you were given.

## 2019-06-15 NOTE — Progress Notes (Addendum)
PCP - Dr. Grayland Ormond  Cardiologist - was seen in 2015 , chest pain admission stress test neg,  patient discharged  Chest x-ray -   EKG - 06/15/2019  Stress Test - 2015  ECHO - no  Cardiac Cath - no    Sleep Study -  CPAP -   LABS- CBC, BMP, T/S  ASA-  ERAS-no  HA1C-na Fasting Blood Sugar - na Checks Blood Sugar __0___ times a day  Anesthesia-  Pt denies having chest pain, sob, or fever at this time. All instructions explained to the pt, with a verbal understanding of the material. Pt agrees to go over the instructions while at home for a better understanding. Pt also instructed to self quarantine after being tested for COVID-19. The opportunity to ask questions was provided.

## 2019-06-16 NOTE — Anesthesia Preprocedure Evaluation (Addendum)
Anesthesia Evaluation  Patient identified by MRN, date of birth, ID band Patient awake    Reviewed: Allergy & Precautions, NPO status , Patient's Chart, lab work & pertinent test results  History of Anesthesia Complications Negative for: history of anesthetic complications  Airway Mallampati: II  TM Distance: >3 FB Neck ROM: Full    Dental  (+) Teeth Intact, Dental Advisory Given   Pulmonary neg pulmonary ROS,    Pulmonary exam normal        Cardiovascular hypertension, Pt. on medications Normal cardiovascular exam     Neuro/Psych Anxiety negative neurological ROS     GI/Hepatic negative GI ROS, Neg liver ROS,   Endo/Other  negative endocrine ROS  Renal/GU negative Renal ROS     Musculoskeletal  (+) Arthritis ,   Abdominal   Peds  Hematology negative hematology ROS (+)   Anesthesia Other Findings Day of surgery medications reviewed with the patient.  Reproductive/Obstetrics                          Anesthesia Physical Anesthesia Plan  ASA: II  Anesthesia Plan: General   Post-op Pain Management:    Induction: Intravenous  PONV Risk Score and Plan: 3 and Treatment may vary due to age or medical condition, Ondansetron, Dexamethasone and Midazolam  Airway Management Planned: Oral ETT  Additional Equipment:   Intra-op Plan:   Post-operative Plan: Extubation in OR  Informed Consent: I have reviewed the patients History and Physical, chart, labs and discussed the procedure including the risks, benefits and alternatives for the proposed anesthesia with the patient or authorized representative who has indicated his/her understanding and acceptance.     Dental advisory given  Plan Discussed with: CRNA  Anesthesia Plan Comments: (Nuclear stress 02/06/14: EF 70%, Normal study )     Anesthesia Quick Evaluation

## 2019-06-17 ENCOUNTER — Ambulatory Visit: Payer: Medicare Other

## 2019-06-18 ENCOUNTER — Inpatient Hospital Stay (HOSPITAL_COMMUNITY): Payer: Medicare Other | Admitting: Physician Assistant

## 2019-06-18 ENCOUNTER — Inpatient Hospital Stay (HOSPITAL_COMMUNITY): Payer: Medicare Other

## 2019-06-18 ENCOUNTER — Encounter (HOSPITAL_COMMUNITY)
Admission: RE | Disposition: A | Discharge status: Home / Self Care | Payer: Self-pay | Source: Home / Self Care | Attending: Neurological Surgery

## 2019-06-18 ENCOUNTER — Encounter (HOSPITAL_COMMUNITY): Payer: Self-pay | Admitting: *Deleted

## 2019-06-18 ENCOUNTER — Other Ambulatory Visit: Payer: Self-pay

## 2019-06-18 ENCOUNTER — Inpatient Hospital Stay (HOSPITAL_COMMUNITY)
Admission: RE | Admit: 2019-06-18 | Discharge: 2019-06-19 | DRG: 460 | Disposition: A | Discharge status: Home / Self Care | Payer: Medicare Other | Attending: Neurological Surgery | Admitting: Neurological Surgery

## 2019-06-18 ENCOUNTER — Inpatient Hospital Stay (HOSPITAL_COMMUNITY): Payer: Medicare Other | Admitting: Certified Registered"

## 2019-06-18 DIAGNOSIS — Z96653 Presence of artificial knee joint, bilateral: Secondary | ICD-10-CM | POA: Diagnosis present

## 2019-06-18 DIAGNOSIS — Z20828 Contact with and (suspected) exposure to other viral communicable diseases: Secondary | ICD-10-CM | POA: Diagnosis present

## 2019-06-18 DIAGNOSIS — Z85828 Personal history of other malignant neoplasm of skin: Secondary | ICD-10-CM | POA: Diagnosis not present

## 2019-06-18 DIAGNOSIS — Z981 Arthrodesis status: Secondary | ICD-10-CM

## 2019-06-18 DIAGNOSIS — I10 Essential (primary) hypertension: Secondary | ICD-10-CM | POA: Diagnosis present

## 2019-06-18 DIAGNOSIS — Z96641 Presence of right artificial hip joint: Secondary | ICD-10-CM | POA: Diagnosis present

## 2019-06-18 DIAGNOSIS — M549 Dorsalgia, unspecified: Secondary | ICD-10-CM | POA: Diagnosis present

## 2019-06-18 DIAGNOSIS — M48062 Spinal stenosis, lumbar region with neurogenic claudication: Principal | ICD-10-CM | POA: Diagnosis present

## 2019-06-18 DIAGNOSIS — Z419 Encounter for procedure for purposes other than remedying health state, unspecified: Secondary | ICD-10-CM

## 2019-06-18 DIAGNOSIS — M4726 Other spondylosis with radiculopathy, lumbar region: Secondary | ICD-10-CM | POA: Diagnosis present

## 2019-06-18 DIAGNOSIS — M5116 Intervertebral disc disorders with radiculopathy, lumbar region: Secondary | ICD-10-CM | POA: Diagnosis present

## 2019-06-18 DIAGNOSIS — Z7982 Long term (current) use of aspirin: Secondary | ICD-10-CM | POA: Diagnosis not present

## 2019-06-18 DIAGNOSIS — Z8249 Family history of ischemic heart disease and other diseases of the circulatory system: Secondary | ICD-10-CM | POA: Diagnosis not present

## 2019-06-18 HISTORY — PX: ANTERIOR LAT LUMBAR FUSION: SHX1168

## 2019-06-18 SURGERY — ANTERIOR LATERAL LUMBAR FUSION 1 LEVEL
Anesthesia: General | Site: Flank | Laterality: Left

## 2019-06-18 MED ORDER — MIDAZOLAM HCL 2 MG/2ML IJ SOLN
INTRAMUSCULAR | Status: AC
  Filled 2019-06-18: qty 2

## 2019-06-18 MED ORDER — THROMBIN 5000 UNITS EX SOLR
OROMUCOSAL | Status: DC | PRN
  Administered 2019-06-18: 5 mL via TOPICAL

## 2019-06-18 MED ORDER — MORPHINE SULFATE (PF) 2 MG/ML IV SOLN: 2.0000 mg | INTRAVENOUS | Status: DC | PRN

## 2019-06-18 MED ORDER — OXYCODONE HCL 5 MG/5ML PO SOLN: 5.0000 mg | Freq: Once | ORAL | Status: DC | PRN

## 2019-06-18 MED ORDER — LIDOCAINE 2% (20 MG/ML) 5 ML SYRINGE
INTRAMUSCULAR | Status: AC
  Filled 2019-06-18: qty 5

## 2019-06-18 MED ORDER — PHENOL 1.4 % MT LIQD: 1.0000 | OROMUCOSAL | Status: DC | PRN

## 2019-06-18 MED ORDER — SENNA 8.6 MG PO TABS
1.0000 | ORAL_TABLET | Freq: Two times a day (BID) | ORAL | Status: DC
  Administered 2019-06-18: 8.6 mg via ORAL
  Filled 2019-06-18: qty 1

## 2019-06-18 MED ORDER — LISINOPRIL 20 MG PO TABS
20.0000 mg | ORAL_TABLET | Freq: Every day | ORAL | Status: DC
  Administered 2019-06-18: 20 mg via ORAL

## 2019-06-18 MED ORDER — LIDOCAINE 2% (20 MG/ML) 5 ML SYRINGE
INTRAMUSCULAR | Status: DC | PRN
  Administered 2019-06-18: 100 mg via INTRAVENOUS

## 2019-06-18 MED ORDER — BUPIVACAINE HCL (PF) 0.5 % IJ SOLN
INTRAMUSCULAR | Status: AC
  Filled 2019-06-18: qty 30

## 2019-06-18 MED ORDER — LATANOPROST 0.005 % OP SOLN
1.0000 [drp] | Freq: Every day | OPHTHALMIC | Status: DC
  Filled 2019-06-18: qty 2.5

## 2019-06-18 MED ORDER — ONDANSETRON HCL 4 MG/2ML IJ SOLN: 4.0000 mg | Freq: Four times a day (QID) | INTRAMUSCULAR | Status: DC | PRN

## 2019-06-18 MED ORDER — SODIUM CHLORIDE 0.9 % IV SOLN: 250.0000 mL | INTRAVENOUS | Status: DC

## 2019-06-18 MED ORDER — HYDROCODONE-ACETAMINOPHEN 5-325 MG PO TABS: 1.0000 | ORAL_TABLET | ORAL | Status: DC | PRN

## 2019-06-18 MED ORDER — KETOROLAC TROMETHAMINE 15 MG/ML IJ SOLN
15.0000 mg | Freq: Four times a day (QID) | INTRAMUSCULAR | Status: AC
  Administered 2019-06-18 – 2019-06-19 (×4): 15 mg via INTRAVENOUS
  Filled 2019-06-18 (×4): qty 1

## 2019-06-18 MED ORDER — ACETAMINOPHEN 325 MG PO TABS: 650.0000 mg | ORAL_TABLET | ORAL | Status: DC | PRN

## 2019-06-18 MED ORDER — POLYETHYLENE GLYCOL 3350 17 G PO PACK: 17.0000 g | PACK | Freq: Every day | ORAL | Status: DC | PRN

## 2019-06-18 MED ORDER — SUFENTANIL CITRATE 50 MCG/ML IV SOLN
INTRAVENOUS | Status: AC
  Filled 2019-06-18: qty 1

## 2019-06-18 MED ORDER — SODIUM CHLORIDE 0.9 % IV SOLN
INTRAVENOUS | Status: DC | PRN
  Administered 2019-06-18: 500 mL

## 2019-06-18 MED ORDER — SUCCINYLCHOLINE CHLORIDE 200 MG/10ML IV SOSY
PREFILLED_SYRINGE | INTRAVENOUS | Status: DC | PRN
  Administered 2019-06-18: 120 mg via INTRAVENOUS

## 2019-06-18 MED ORDER — SODIUM CHLORIDE 0.9% FLUSH
3.0000 mL | Freq: Two times a day (BID) | INTRAVENOUS | Status: DC
  Administered 2019-06-18: 3 mL via INTRAVENOUS

## 2019-06-18 MED ORDER — PHENYLEPHRINE HCL (PRESSORS) 10 MG/ML IV SOLN
INTRAVENOUS | Status: AC
  Filled 2019-06-18: qty 1

## 2019-06-18 MED ORDER — DEXAMETHASONE SODIUM PHOSPHATE 10 MG/ML IJ SOLN
INTRAMUSCULAR | Status: AC
  Filled 2019-06-18: qty 1

## 2019-06-18 MED ORDER — BUPIVACAINE HCL (PF) 0.5 % IJ SOLN
INTRAMUSCULAR | Status: DC | PRN
  Administered 2019-06-18: 5 mL
  Administered 2019-06-18: 10 mL

## 2019-06-18 MED ORDER — HYDROMORPHONE HCL 1 MG/ML IJ SOLN
0.2500 mg | INTRAMUSCULAR | Status: DC | PRN
  Administered 2019-06-18 (×2): 0.5 mg via INTRAVENOUS

## 2019-06-18 MED ORDER — DOCUSATE SODIUM 100 MG PO CAPS
100.0000 mg | ORAL_CAPSULE | Freq: Two times a day (BID) | ORAL | Status: DC
  Administered 2019-06-18 – 2019-06-19 (×2): 100 mg via ORAL
  Filled 2019-06-18: qty 1

## 2019-06-18 MED ORDER — 0.9 % SODIUM CHLORIDE (POUR BTL) OPTIME
TOPICAL | Status: DC | PRN
  Administered 2019-06-18: 1000 mL

## 2019-06-18 MED ORDER — METHOCARBAMOL 500 MG PO TABS
500.0000 mg | ORAL_TABLET | Freq: Four times a day (QID) | ORAL | Status: DC | PRN
  Administered 2019-06-18: 500 mg via ORAL
  Filled 2019-06-18 (×2): qty 1

## 2019-06-18 MED ORDER — THROMBIN 5000 UNITS EX SOLR
CUTANEOUS | Status: AC
  Filled 2019-06-18: qty 5000

## 2019-06-18 MED ORDER — CHLORHEXIDINE GLUCONATE CLOTH 2 % EX PADS: 6.0000 | MEDICATED_PAD | Freq: Once | CUTANEOUS | Status: DC

## 2019-06-18 MED ORDER — SODIUM CHLORIDE 0.9% FLUSH: 3.0000 mL | INTRAVENOUS | Status: DC | PRN

## 2019-06-18 MED ORDER — SODIUM CHLORIDE (PF) 0.9 % IJ SOLN
INTRAMUSCULAR | Status: AC
  Filled 2019-06-18: qty 10

## 2019-06-18 MED ORDER — PROMETHAZINE HCL 25 MG/ML IJ SOLN: 6.2500 mg | INTRAMUSCULAR | Status: DC | PRN

## 2019-06-18 MED ORDER — ALUM & MAG HYDROXIDE-SIMETH 200-200-20 MG/5ML PO SUSP: 30.0000 mL | Freq: Four times a day (QID) | ORAL | Status: DC | PRN

## 2019-06-18 MED ORDER — METHOCARBAMOL 1000 MG/10ML IJ SOLN
500.0000 mg | Freq: Four times a day (QID) | INTRAVENOUS | Status: DC | PRN
  Filled 2019-06-18: qty 5

## 2019-06-18 MED ORDER — LACTATED RINGERS IV SOLN
INTRAVENOUS | Status: DC | PRN
  Administered 2019-06-18: 07:00:00 via INTRAVENOUS

## 2019-06-18 MED ORDER — ONDANSETRON HCL 4 MG/2ML IJ SOLN
INTRAMUSCULAR | Status: DC | PRN
  Administered 2019-06-18: 4 mg via INTRAVENOUS

## 2019-06-18 MED ORDER — SUFENTANIL CITRATE 50 MCG/ML IV SOLN
INTRAVENOUS | Status: DC | PRN
  Administered 2019-06-18: 20 ug via INTRAVENOUS
  Administered 2019-06-18: 10 ug via INTRAVENOUS

## 2019-06-18 MED ORDER — ONDANSETRON HCL 4 MG/2ML IJ SOLN
INTRAMUSCULAR | Status: AC
  Filled 2019-06-18: qty 2

## 2019-06-18 MED ORDER — ONDANSETRON HCL 4 MG PO TABS: 4.0000 mg | ORAL_TABLET | Freq: Four times a day (QID) | ORAL | Status: DC | PRN

## 2019-06-18 MED ORDER — PHENYLEPHRINE 40 MCG/ML (10ML) SYRINGE FOR IV PUSH (FOR BLOOD PRESSURE SUPPORT)
PREFILLED_SYRINGE | INTRAVENOUS | Status: DC | PRN
  Administered 2019-06-18: 160 ug via INTRAVENOUS
  Administered 2019-06-18 (×2): 120 ug via INTRAVENOUS

## 2019-06-18 MED ORDER — OXYCODONE-ACETAMINOPHEN 5-325 MG PO TABS
1.0000 | ORAL_TABLET | ORAL | Status: DC | PRN
  Administered 2019-06-18 (×2): 2 via ORAL
  Filled 2019-06-18 (×2): qty 2

## 2019-06-18 MED ORDER — ALPRAZOLAM 0.5 MG PO TABS: 1.0000 mg | ORAL_TABLET | Freq: Every evening | ORAL | Status: DC | PRN

## 2019-06-18 MED ORDER — HYDROMORPHONE HCL 1 MG/ML IJ SOLN
INTRAMUSCULAR | Status: AC
  Filled 2019-06-18: qty 1

## 2019-06-18 MED ORDER — LIDOCAINE-EPINEPHRINE 1 %-1:100000 IJ SOLN
INTRAMUSCULAR | Status: DC | PRN
  Administered 2019-06-18: 5 mL

## 2019-06-18 MED ORDER — PROPOFOL 10 MG/ML IV BOLUS
INTRAVENOUS | Status: DC | PRN
  Administered 2019-06-18: 200 mg via INTRAVENOUS

## 2019-06-18 MED ORDER — BISACODYL 10 MG RE SUPP: 10.0000 mg | Freq: Every day | RECTAL | Status: DC | PRN

## 2019-06-18 MED ORDER — HYDROCODONE-ACETAMINOPHEN 5-325 MG PO TABS: 1.0000 | ORAL_TABLET | Freq: Four times a day (QID) | ORAL | Status: DC | PRN

## 2019-06-18 MED ORDER — ACETAMINOPHEN 10 MG/ML IV SOLN: 1000.0000 mg | Freq: Once | INTRAVENOUS | Status: DC | PRN

## 2019-06-18 MED ORDER — CEFAZOLIN SODIUM-DEXTROSE 2-4 GM/100ML-% IV SOLN
2.0000 g | Freq: Three times a day (TID) | INTRAVENOUS | Status: AC
  Administered 2019-06-18 (×2): 2 g via INTRAVENOUS
  Filled 2019-06-18 (×2): qty 100

## 2019-06-18 MED ORDER — CEFAZOLIN SODIUM-DEXTROSE 2-4 GM/100ML-% IV SOLN
2.0000 g | INTRAVENOUS | Status: AC
  Administered 2019-06-18: 2 g via INTRAVENOUS
  Filled 2019-06-18: qty 100

## 2019-06-18 MED ORDER — DEXAMETHASONE SODIUM PHOSPHATE 10 MG/ML IJ SOLN
INTRAMUSCULAR | Status: DC | PRN
  Administered 2019-06-18: 10 mg via INTRAVENOUS

## 2019-06-18 MED ORDER — ACETAMINOPHEN 650 MG RE SUPP: 650.0000 mg | RECTAL | Status: DC | PRN

## 2019-06-18 MED ORDER — SUCCINYLCHOLINE CHLORIDE 200 MG/10ML IV SOSY
PREFILLED_SYRINGE | INTRAVENOUS | Status: AC
  Filled 2019-06-18: qty 10

## 2019-06-18 MED ORDER — MIDAZOLAM HCL 5 MG/5ML IJ SOLN
INTRAMUSCULAR | Status: DC | PRN
  Administered 2019-06-18: 2 mg via INTRAVENOUS

## 2019-06-18 MED ORDER — PROPOFOL 10 MG/ML IV BOLUS
INTRAVENOUS | Status: AC
  Filled 2019-06-18: qty 20

## 2019-06-18 MED ORDER — OXYCODONE HCL 5 MG PO TABS: 5.0000 mg | ORAL_TABLET | Freq: Once | ORAL | Status: DC | PRN

## 2019-06-18 MED ORDER — SODIUM CHLORIDE 0.9 % IV SOLN
INTRAVENOUS | Status: DC | PRN
  Administered 2019-06-18: 100 ug/min via INTRAVENOUS

## 2019-06-18 MED ORDER — LIDOCAINE-EPINEPHRINE 1 %-1:100000 IJ SOLN
INTRAMUSCULAR | Status: AC
  Filled 2019-06-18: qty 1

## 2019-06-18 MED ORDER — FLEET ENEMA 7-19 GM/118ML RE ENEM: 1.0000 | ENEMA | Freq: Once | RECTAL | Status: DC | PRN

## 2019-06-18 MED ORDER — MENTHOL 3 MG MT LOZG: 1.0000 | LOZENGE | OROMUCOSAL | Status: DC | PRN

## 2019-06-18 SURGICAL SUPPLY — 44 items
BLADE CLIPPER SURG (BLADE) IMPLANT
BOLT PLATE XLIF 5.5X55 LRG (Bolt) ×4 IMPLANT
BONE MATRIX OSTEOCEL PRO MED (Bone Implant) ×2 IMPLANT
COVER WAND RF STERILE (DRAPES) IMPLANT
DERMABOND ADVANCED (GAUZE/BANDAGES/DRESSINGS) ×1
DERMABOND ADVANCED .7 DNX12 (GAUZE/BANDAGES/DRESSINGS) ×1 IMPLANT
DRAPE C-ARM 42X72 X-RAY (DRAPES) ×2 IMPLANT
DRAPE C-ARMOR (DRAPES) ×2 IMPLANT
DRAPE LAPAROTOMY 100X72X124 (DRAPES) ×2 IMPLANT
DRAPE POUCH INSTRU U-SHP 10X18 (DRAPES) IMPLANT
DURAPREP 26ML APPLICATOR (WOUND CARE) ×2 IMPLANT
ELECT REM PT RETURN 9FT ADLT (ELECTROSURGICAL) ×2
ELECTRODE REM PT RTRN 9FT ADLT (ELECTROSURGICAL) ×1 IMPLANT
GAUZE 4X4 16PLY RFD (DISPOSABLE) IMPLANT
GLOVE BIOGEL PI IND STRL 8 (GLOVE) ×2 IMPLANT
GLOVE BIOGEL PI IND STRL 8.5 (GLOVE) ×1 IMPLANT
GLOVE BIOGEL PI INDICATOR 8 (GLOVE) ×2
GLOVE BIOGEL PI INDICATOR 8.5 (GLOVE) ×1
GLOVE ECLIPSE 7.5 STRL STRAW (GLOVE) ×6 IMPLANT
GLOVE ECLIPSE 8.5 STRL (GLOVE) ×2 IMPLANT
GLOVE EXAM NITRILE XL STR (GLOVE) IMPLANT
GOWN STRL REUS W/ TWL LRG LVL3 (GOWN DISPOSABLE) IMPLANT
GOWN STRL REUS W/ TWL XL LVL3 (GOWN DISPOSABLE) ×1 IMPLANT
GOWN STRL REUS W/TWL 2XL LVL3 (GOWN DISPOSABLE) ×2 IMPLANT
GOWN STRL REUS W/TWL LRG LVL3 (GOWN DISPOSABLE)
GOWN STRL REUS W/TWL XL LVL3 (GOWN DISPOSABLE) ×1
HEMOSTAT POWDER KIT SURGIFOAM (HEMOSTASIS) ×2 IMPLANT
KIT BASIN OR (CUSTOM PROCEDURE TRAY) ×2 IMPLANT
KIT DILATOR XLIF 5 (KITS) ×2 IMPLANT
KIT SURGICAL ACCESS MAXCESS 4 (KITS) ×2 IMPLANT
KIT TURNOVER KIT B (KITS) ×2 IMPLANT
MODULE NVM5 NEXT GEN EMG (NEEDLE) ×2 IMPLANT
MODULUS XLW 12X22X55MM 10 (Spine Construct) ×2 IMPLANT
NEEDLE HYPO 25X1 1.5 SAFETY (NEEDLE) ×2 IMPLANT
NS IRRIG 1000ML POUR BTL (IV SOLUTION) ×2 IMPLANT
PACK LAMINECTOMY NEURO (CUSTOM PROCEDURE TRAY) ×2 IMPLANT
PLATE DECADE XLIP 2H SZ12 (Plate) ×2 IMPLANT
SPONGE LAP 4X18 RFD (DISPOSABLE) IMPLANT
SUT VIC AB 2-0 CP2 18 (SUTURE) ×2 IMPLANT
SUT VIC AB 3-0 SH 8-18 (SUTURE) ×2 IMPLANT
TOWEL GREEN STERILE (TOWEL DISPOSABLE) ×2 IMPLANT
TOWEL GREEN STERILE FF (TOWEL DISPOSABLE) ×2 IMPLANT
TRAY FOLEY MTR SLVR 16FR STAT (SET/KITS/TRAYS/PACK) ×2 IMPLANT
WATER STERILE IRR 1000ML POUR (IV SOLUTION) ×2 IMPLANT

## 2019-06-18 NOTE — Op Note (Signed)
Date of surgery: 06/18/2019 Preoperative diagnosis: Lumbar stenosis L3-L4 with lumbar radiculopathy, neurogenic claudication.  History of fusion L5-S1 Postoperative diagnosis: Same Procedure: Anterolateral decompression L3-L4 with total discectomy lateral plate fixation with ostia cell allograft and lateral interbody spacer measuring 12 x 22 x 50 mm in size with 10 degrees of lordosis.  EMG monitoring throughout the case. Surgeon: Kristeen Miss Anesthesia: General endotracheal Indications: Patient is a 63 year old individual who has had significant back and right greater than left lower extremity pain.  He has evidence of severe stenosis at L3-L4 with a posterior lateral disc herniation.  He has been advised regarding anterolateral decompression and lateral plate fixation.  Procedure: Patient was brought to the operating room supine on the stretcher.  After the smooth induction of general endotracheal anesthesia he had EMG monitoring electrodes applied to the major muscle groups in the lower extremities.  He was then carefully turned into the right lateral decubitus position and fluoroscopic imaging was used to localize L3-L4 and check for an orthogonal position on the table.  When this was verified he was taped into position.  Then the lateral aspect of the skin was marked over the L3-L4 space.  Area was prepped with alcohol DuraPrep and draped in a sterile fashion.  Incision was made on the lateral aspect of the skin in the marked area after infiltrating with lidocaine and epinephrine.  Dissection was carried down to the outer layer of the fascia.  Then a second incision was created posteriorly in the flank and a blunt dissector was used to enter the retroperitoneal space.  Then by using 1 finger to guide a guide tube through the lateral incision the lateral fascia was punctured and the probe was placed through the psoas muscle into the L3-L4 disc space.  Radiographic confirmation was obtained and a K wire  was placed into the disc space.  The probe was then stimulated to make sure there is no electrical activity from any of the lumbar and lower extremity musculature.  A series of dilators were then passed each time checking for electrical activity when none was noted ultimately 150 mm long retractor was placed through the opening over the disc space and it was gradually dilated.  A freehanded probe was then used to check the area to make sure no elements of the lumbar plexus were being retracted or could be potentially damaged.  With that the retractor was set by placing a shim into the disc space at L3-4 and locking take the arm took clamp on the table.  The space was then opened with a #15 blade and a series of curettes and rongeurs was used to evacuate significant quantities of degenerated disc material a large Cobb elevator was then passed across the disc space to open the contralateral ligament.  The disc space was then evacuated using a combination of curettes and rongeurs to remove endplate cartilage.  Then a series of spacers was placed into the interspace and it was dilated to an 18 mm x 8 spacer.  Lateral x-ray was checked to see that the opening would allow for placement of a 22 mm long spacer.  All this time EMG monitoring was performed in electrical silence was noted from the major muscle groups in the lower extremities.  Ultimately a 12 mm tall by 22 mm x 55 mm spacer with 10 degrees of lordosis was felt to fit best into this interspace to allow for some distraction of the interspace also.  This was filled with  ostia cell and placed under fluoroscopic visualization.  1 set in appropriate position both in the AP and lateral projections the retractor was further opened to allow placement of a lateral plate a 12 mm tall lateral plate was placed with 255 mm screws securing the interspace.  Final radiographs were obtained after removal of the retractor hemostasis was also checked and when it was felt that it  was adequately dry the fascia was closed with 2-0 Vicryl in interrupted fashion 3-0 Vicryl was used subcuticularly 10 cc of lidocaine mixed with half percent Marcaine was injected into the area for further analgesia.  Blood loss for the procedure was estimated about 30 cc.

## 2019-06-18 NOTE — H&P (Signed)
Matthew [name] is an 63 y.o. male.   Chief Complaint: Back and lower extremity pain HPI: Matthew [name] is a 62 year old individual who has had significant problems with his back over a number of years.  Number of years ago Dr. Glenna Fellows did a decompression and fusion at the level of L5-S1.  For the last few years he has had increasing pain more in his right lower extremity than his left but across his back also.  Dr. Carloyn Manner was told that he would likely need surgery at several other levels.  Most recent MRI demonstrates that the patient has a moderately severe stenosis at the level of L3-L4 and L4-L5 he has some mild to moderate changes of spondylosis but no evidence of nerve root entrapment or significant central canal stenosis.  After careful consideration of his options I advised an anterolateral decompression at L3-L4 to open the interspace and decompress the exiting nerve root particularly L3 on the right side.  He is now admitted for this procedure and it will be accomplished pushed with lateral plate fixation.  Past Medical History:  Diagnosis Date  . Anxiety   . Arthritis    KNEES BACK SHOULDERS  . Cancer (HCC)    HX SKIN CANCER  . Heart murmur    "nothing to be concerned with, I ve had it all my life."  . Hx of nonmelanoma skin cancer   . Hypertension    FOLLOWED BY DR ED GREEN    Past Surgical History:  Procedure Laterality Date  . BACK SURGERY  2010   L5S1 FUSION  . COLONOSCOPY  05/13/2019  . KNEE ARTHROPLASTY  2010   L  . KNEE ARTHROSCOPY     L X 2  . REPLACEMENT TOTAL KNEE Left 11/18/2008  . SHOULDER ARTHROSCOPY Left 05/14/2019  . TOTAL HIP ARTHROPLASTY Right 09/09/2015   Procedure: RIGHT TOTAL HIP ARTHROPLASTY ANTERIOR APPROACH;  Surgeon: Mcarthur Rossetti, MD;  Location: [facility];  Service: Orthopedics;  Laterality: Right;  . TOTAL KNEE ARTHROPLASTY  10/08/2011   Procedure: rightTOTAL KNEE ARTHROPLASTY;  Surgeon: Rudean Haskell, MD;  Location: [place];  Service:  Orthopedics;  Laterality: Right;  TOTAL RIGHT KNEE ARTHROPLASTY     Family History  Problem Relation Age of Onset  . Heart attack Brother        26  . Heart attack Paternal Grandfather        5 bypass surgeries  . Cancer Father        died of bladder cancer    Social History:  reports that he has never smoked. He has never used smokeless tobacco. He reports current alcohol use of about 1.0 standard drinks of alcohol per week. He reports that he does not use drugs.  Allergies: No Known Allergies  Medications Prior to Admission  Medication Sig Dispense Refill  . ALPRAZolam (XANAX) 1 MG tablet Take 1 mg by mouth at bedtime as needed for sleep.     Marland Kitchen HYDROcodone-acetaminophen (NORCO/VICODIN) 5-325 MG tablet TAKE 1 OR 2 TABLETS BY MOUTH EVERY 6 HOURS AS NEEDED FOR MODERATE PAIN (Patient taking differently: Take 1-2 tablets by mouth every 6 (six) hours as needed for moderate pain. ) 50 tablet 0  . latanoprost (XALATAN) 0.005 % ophthalmic solution Place 1 drop into both eyes at bedtime.    Marland Kitchen lisinopril (PRINIVIL,ZESTRIL) 20 MG tablet Take 20 mg by mouth daily.      Marland Kitchen oxyCODONE-acetaminophen (ROXICET) 5-325 MG tablet Take 1-2 tablets by mouth  every 6 (six) hours as needed. 40 tablet 0  . aspirin EC 325 MG EC tablet Take 1 tablet (325 mg total) by mouth 2 (two) times daily after a meal. (Patient not taking: Reported on 05/25/2019) 30 tablet 0  . celecoxib (CELEBREX) 200 MG capsule Take 1 capsule (200 mg total) by mouth 2 (two) times daily between meals as needed. (Patient not taking: Reported on 05/25/2019) 60 capsule 3  . diazepam (VALIUM) 5 MG tablet TAKE ONE TAB ONE HOUR PRIOR TO MRI REPEAT AS NEEDED #2 . ZERO REFILLS (Patient not taking: Reported on 05/25/2019) 2 tablet 0  . hydrochlorothiazide (HYDRODIURIL) 25 MG tablet Take 25 mg by mouth daily.      No results found for this or any previous visit (from the past 48 hour(s)). No results found.  Review of Systems  Constitutional: Negative.    HENT: Negative.   Eyes: Negative.   Respiratory: Negative.   Cardiovascular:       Hypertension  Gastrointestinal:       Intentional weight loss of 60 pounds  Genitourinary: Negative.   Musculoskeletal: Positive for back pain.  Skin: Negative.   Neurological: Positive for tingling, sensory change and focal weakness.  Endo/Heme/Allergies: Negative.     Blood pressure (!) 140/96, pulse 74, temperature 98 F (36.7 C), temperature source Oral, resp. rate 20, height 5\' 7"  (1.702 m), weight 93 kg, SpO2 96 %. Physical Exam  Constitutional: He is oriented to person, place, and time. He appears well-developed and well-nourished.  HENT:  Head: Normocephalic and atraumatic.  Eyes: EOM are normal.  Neck: Normal range of motion. Neck supple.  Respiratory: Effort normal and breath sounds normal.  GI: Soft. Bowel sounds are normal.  Musculoskeletal:     Comments: Motor function is intact in the lower extremities positive straight leg raising at 15 degrees in either lower extremity Patrick's maneuver is negative  Neurological: He is alert and oriented to person, place, and time.  Iliopsoas quadriceps tibialis anterior and gastroc strength is intact.  Absent reflexes in the patellae and Achilles.  Cranial nerve examination is normal.  Upper extremity strength and reflexes are normal.  Range of motion of the neck is normal  Skin: Skin is warm and dry.  Psychiatric: He has a normal mood and affect. His behavior is normal. Judgment and thought content normal.     Assessment/Plan Spondylosis and stenosis L3-L4 with herniated nucleus pulposus in the extraforaminal zone on the right.  History of fusion L5-S1.  Plan: Anterolateral decompression L3-4 right with lateral plate fixation.  Earleen Newport, MD 06/18/2019, 7:51 AM

## 2019-06-18 NOTE — Transfer of Care (Signed)
Immediate Anesthesia Transfer of Care Note  Patient: Matthew [name]  Procedure(s) Performed: Left Lumbar three-four Anterolateral lumbar decompression/interbody fusion with lateral plate fixation (Left Flank)  Patient Location: PACU  Anesthesia Type:General  Level of Consciousness: drowsy and patient cooperative  Airway & Oxygen Therapy: Patient Spontanous Breathing and Patient connected to nasal cannula oxygen  Post-op Assessment: Report given to RN, Post -op Vital signs reviewed and stable and Patient moving all extremities  Post vital signs: Reviewed and stable  Last Vitals:  Vitals Value Taken Time  BP 118/82 06/18/19 0939  Temp    Pulse 71 06/18/19 0940  Resp 14 06/18/19 0940  SpO2 100 % 06/18/19 0940  Vitals shown include unvalidated device data.  Last Pain:  Vitals:   06/18/19 0617  TempSrc: Oral  PainSc:          Complications: No apparent anesthesia complications

## 2019-06-18 NOTE — Anesthesia Postprocedure Evaluation (Signed)
Anesthesia Post Note  Patient: Jerimah Witucki Shin  Procedure(s) Performed: Left Lumbar three-four Anterolateral lumbar decompression/interbody fusion with lateral plate fixation (Left Flank)     Patient location during evaluation: PACU Anesthesia Type: General Level of consciousness: awake and alert and oriented Pain management: pain level controlled Vital Signs Assessment: post-procedure vital signs reviewed and stable Respiratory status: spontaneous breathing, nonlabored ventilation and respiratory function stable Cardiovascular status: blood pressure returned to baseline Postop Assessment: no apparent nausea or vomiting Anesthetic complications: no    Last Vitals:  Vitals:   06/18/19 1030 06/18/19 1047  BP: 116/89 (!) 129/92  Pulse: 70 71  Resp: 14 19  Temp: 36.7 C 36.6 C  SpO2: 98% 97%    Last Pain:  Vitals:   06/18/19 1047  TempSrc: Oral  PainSc:                  Brennan Bailey

## 2019-06-18 NOTE — Evaluation (Signed)
Physical Therapy Evaluation and Discharge Patient Details Name: Matthew [name] MRN: [id number] DOB: 05-Jul-1956 Today's Date: 06/18/2019   History of Present Illness  Pt is a 63 y/o male s/p L3-4 ALIF. PMH includes skin cancer, HTN, B TKA, and R THA.   Clinical Impression  Patient evaluated by Physical Therapy with no further acute PT needs identified. All education has been completed and the patient has no further questions. Pt overall steady with mobility tasks with no LOB noted during gait and stair navigation. Reports daughter is staying with pt at d/c. Educated about back precautions and walking program to perform at home. See below for any follow-up Physical Therapy or equipment needs. PT is signing off. Thank you for this referral. If needs change, please re-consult.      Follow Up Recommendations No PT follow up    Equipment Recommendations  None recommended by PT    Recommendations for Other Services       Precautions / Restrictions Precautions Precautions: Back;Other (comment) Precaution Booklet Issued: Yes (comment) Precaution Comments: Pt with recent L rotator cuff repair. Reviewed back precautions with pt.  Required Braces or Orthoses: Spinal Brace Spinal Brace: Lumbar corset;Applied in sitting position Restrictions Weight Bearing Restrictions: No      Mobility  Bed Mobility Overal bed mobility: Needs Assistance Bed Mobility: Rolling;Sidelying to Sit;Sit to Sidelying Rolling: Supervision Sidelying to sit: Supervision     Sit to sidelying: Supervision General bed mobility comments: Supervision for safety. Pt rolling towards R side as pt with L rotator cuff repair. Cues for use of log roll technique.   Transfers Overall transfer level: Needs assistance Equipment used: None Transfers: Sit to/from Stand Sit to Stand: Min guard         General transfer comment: Min guard for safety. No physical assist required.   Ambulation/Gait Ambulation/Gait  assistance: Min guard Gait Distance (Feet): 400 Feet Assistive device: None Gait Pattern/deviations: Step-through pattern;Decreased stride length Gait velocity: Decreased   General Gait Details: Slow, cautious gait, however, overall steady. No overt LOB noted. Educated about generalized walking program to perform at home.   Stairs Stairs: Yes Stairs assistance: Min guard Stair Management: One rail Left;Alternating pattern;Forwards Number of Stairs: 2 General stair comments: OVerall steady stair navigation. No overt LOB noted. Min guard for safety.   Wheelchair Mobility    Modified Rankin (Stroke Patients Only)       Balance Overall balance assessment: Needs assistance Sitting-balance support: No upper extremity supported;Feet supported Sitting balance-Leahy Scale: Good     Standing balance support: No upper extremity supported;During functional activity Standing balance-Leahy Scale: Fair                               Pertinent Vitals/Pain Pain Assessment: 0-10 Pain Score: 3  Pain Location: back Pain Descriptors / Indicators: Aching;Operative site guarding Pain Intervention(s): Limited activity within patient's tolerance;Monitored during session;Repositioned    Home Living Family/patient expects to be discharged to:: Private residence Living Arrangements: Alone Available Help at Discharge: Family;Available 24 hours/day Type of Home: House Home Access: Stairs to enter Entrance Stairs-Rails: Left Entrance Stairs-Number of Steps: 1 Home Layout: One level Home Equipment: None      Prior Function Level of Independence: Independent               Hand Dominance        Extremity/Trunk Assessment   Upper Extremity Assessment Upper Extremity Assessment: Defer to OT evaluation  Lower Extremity Assessment Lower Extremity Assessment: Overall WFL for tasks assessed    Cervical / Trunk Assessment Cervical / Trunk Assessment: Other  exceptions Cervical / Trunk Exceptions: s/p back  Communication   Communication: No difficulties  Cognition Arousal/Alertness: Awake/alert Behavior During Therapy: WFL for tasks assessed/performed Overall Cognitive Status: Within Functional Limits for tasks assessed                                        General Comments      Exercises     Assessment/Plan    PT Assessment Patent does not need any further PT services  PT Problem List         PT Treatment Interventions      PT Goals (Current goals can be found in the Care Plan section)  Acute Rehab PT Goals Patient Stated Goal: to go home PT Goal Formulation: With patient Time For Goal Achievement: 06/18/19 Potential to Achieve Goals: Good    Frequency     Barriers to discharge        Co-evaluation               AM-PAC PT "6 Clicks" Mobility  Outcome Measure Help needed turning from your back to your side while in a flat bed without using bedrails?: None Help needed moving from lying on your back to sitting on the side of a flat bed without using bedrails?: None Help needed moving to and from a bed to a chair (including a wheelchair)?: None Help needed standing up from a chair using your arms (e.g., wheelchair or bedside chair)?: A Little Help needed to walk in hospital room?: A Little Help needed climbing 3-5 steps with a railing? : A Little 6 Click Score: 21    End of Session Equipment Utilized During Treatment: Gait belt;Back brace Activity Tolerance: Patient tolerated treatment well Patient left: in bed;with call bell/phone within reach Nurse Communication: Mobility status PT Visit Diagnosis: Other abnormalities of gait and mobility (R26.89)    Time: 0370-4888 PT Time Calculation (min) (ACUTE ONLY): 18 min   Charges:   PT Evaluation $PT Eval Low Complexity: [place], PT, DPT  Acute Rehabilitation Services  Pager: [phone] Office: [phone]   Rudean Hitt 06/18/2019, 4:27 PM

## 2019-06-18 NOTE — Anesthesia Procedure Notes (Signed)
Procedure Name: Intubation Date/Time: 06/18/2019 7:47 AM Performed by: Moshe Salisbury, CRNA Pre-anesthesia Checklist: Patient identified, Emergency Drugs available, Suction available and Patient being monitored Patient Re-evaluated:Patient Re-evaluated prior to induction Oxygen Delivery Method: Circle System Utilized Preoxygenation: Pre-oxygenation with 100% oxygen Induction Type: IV induction Ventilation: Mask ventilation without difficulty Laryngoscope Size: Mac and 4 Grade View: Grade I Tube type: Oral Tube size: 8.0 mm Number of attempts: 1 Airway Equipment and Method: Stylet and Oral airway Placement Confirmation: ETT inserted through vocal cords under direct vision,  positive ETCO2 and breath sounds checked- equal and bilateral Secured at: 24 cm Tube secured with: Tape Dental Injury: Teeth and Oropharynx as per pre-operative assessment

## 2019-06-18 NOTE — Progress Notes (Signed)
Patient ID: Matthew [name], male   DOB: April 30, 1956, 63 y.o.   MRN: [id number] Vital signs stable  Motor function good Mobilize as tolerated.

## 2019-06-19 ENCOUNTER — Encounter (HOSPITAL_COMMUNITY): Payer: Self-pay | Admitting: Neurological Surgery

## 2019-06-19 MED ORDER — METHOCARBAMOL 500 MG PO TABS: 500.0000 mg | ORAL_TABLET | Freq: Four times a day (QID) | ORAL | 3 refills | Status: DC | PRN

## 2019-06-19 MED ORDER — OXYCODONE-ACETAMINOPHEN 5-325 MG PO TABS: 1.0000 | ORAL_TABLET | ORAL | 0 refills | Status: DC | PRN

## 2019-06-19 NOTE — Discharge Instructions (Signed)
  Call Your Doctor If Any of These Occur Redness, drainage, or swelling at the wound.  Temperature greater than 101 degrees. Severe pain not relieved by pain medication. Increased difficulty swallowing. Incision starts to come apart. Follow Up Appt Call today for appointment in 3 weeks [phone]) or for problems.  If you have any hardware placed in your spine, you will need an x-ray before your appointment.   

## 2019-06-19 NOTE — Plan of Care (Signed)
Patient alert and oriented, mae's well, voiding adequate amount of urine, swallowing without difficulty, no c/o pain at time of discharge. Patient discharged home with family. Script and discharged instructions given to patient. Patient and family stated understanding of instructions given. Patient has an appointment with Dr. Elsner  

## 2019-06-19 NOTE — Evaluation (Signed)
Occupational Therapy Evaluation and Discharge Patient Details Name: Matthew [name] MRN: [id number] DOB: 1956/06/22 Today's Date: 06/19/2019    History of Present Illness Pt is a 63 y/o male s/p L3-4 ALIF. PMH includes skin cancer, HTN, B TKA, and R THA.    Clinical Impression   All education completed with pt demonstrating understanding. He is functioning at a modified independent level with AE. No further OT needs.    Follow Up Recommendations  No OT follow up    Equipment Recommendations  None recommended by OT    Recommendations for Other Services       Precautions / Restrictions Precautions Precautions: Back Precaution Booklet Issued: Yes (comment) Precaution Comments: Pt with recent L rotator cuff repair. Reviewed back precautions with pt.  Required Braces or Orthoses: Spinal Brace Spinal Brace: Lumbar corset;Applied in sitting position Restrictions Weight Bearing Restrictions: No      Mobility Bed Mobility Overal bed mobility: Modified Independent             General bed mobility comments: using log roll technique  Transfers Overall transfer level: Modified independent Equipment used: None                  Balance     Sitting balance-Leahy Scale: Good       Standing balance-Leahy Scale: Good                             ADL either performed or assessed with clinical judgement   ADL Overall ADL's : Modified independent                                       General ADL Comments: Educated pt in compensatory strategies for ADL, AE use of LB bathing and dressing, IADL to avoid, back brace wearing schedule     Vision Baseline Vision/History: Wears glasses Wears Glasses: At all times Patient Visual Report: No change from baseline       Perception     Praxis      Pertinent Vitals/Pain Pain Assessment: Faces Faces Pain Scale: Hurts a little bit Pain Location: back Pain Descriptors / Indicators: Operative  site guarding Pain Intervention(s): Monitored during session;Repositioned     Hand Dominance Right   Extremity/Trunk Assessment Upper Extremity Assessment Upper Extremity Assessment: LUE deficits/detail LUE Deficits / Details: recent rotator cuff repair, in OP therapy LUE Coordination: decreased gross motor   Lower Extremity Assessment Lower Extremity Assessment: Defer to PT evaluation   Cervical / Trunk Assessment Cervical / Trunk Assessment: Other exceptions Cervical / Trunk Exceptions: s/p back surgery   Communication Communication Communication: No difficulties   Cognition Arousal/Alertness: Awake/alert Behavior During Therapy: WFL for tasks assessed/performed Overall Cognitive Status: Within Functional Limits for tasks assessed                                     General Comments       Exercises     Shoulder Instructions      Home Living Family/patient expects to be discharged to:: Private residence Living Arrangements: Alone Available Help at Discharge: Family;Available 24 hours/day Type of Home: House Home Access: Stairs to enter [employer] of Steps: 1 Entrance Stairs-Rails: Left Home Layout: One level     Bathroom Shower/Tub: Walk-in shower  Bathroom Toilet: Handicapped height     Home Equipment: None          Prior Functioning/Environment Level of Independence: Independent        Comments: likes to golf        OT Problem List:        OT Treatment/Interventions:      OT Goals(Current goals can be found in the care plan section) Acute Rehab OT Goals Patient Stated Goal: to go home  OT Frequency:     Barriers to D/C:            Co-evaluation              AM-PAC OT "6 Clicks" Daily Activity     Outcome Measure Help from another person eating meals?: None Help from another person taking care of personal grooming?: None Help from another person toileting, which includes using toliet, bedpan, or  urinal?: None Help from another person bathing (including washing, rinsing, drying)?: None Help from another person to put on and taking off regular upper body clothing?: None Help from another person to put on and taking off regular lower body clothing?: None 6 Click Score: 24   End of Session    Activity Tolerance: Patient tolerated treatment well Patient left: in bed;with call bell/phone within reach  OT Visit Diagnosis: Pain                Time: 0518-3358 OT Time Calculation (min): 28 min Charges:  OT General Charges $OT Visit: 1 Visit OT Evaluation $OT Eval Low Complexity: 1 Low OT Treatments $Self Care/Home Management : 8-22 mins  Nestor Lewandowsky, OTR/L Acute Rehabilitation Services Pager: [phone] Office: [phone]  [name] 06/19/2019, 10:33 AM

## 2019-06-19 NOTE — Discharge Summary (Signed)
Physician Discharge Summary  Patient ID: Matthew [name] MRN: [id number] DOB/AGE: 1955/11/18 63 y.o.  Admit date: 06/18/2019 Discharge date: 06/19/2019  Admission Diagnoses: Lumbar spondylosis and stenosis L3-L4 with right lumbar radiculopathy  Discharge Diagnoses: Bar spondylosis and stenosis L3-L4 with right lumbar radiculopathy Active Problems:   Lumbar stenosis with neurogenic claudication   Discharged Condition: good  Hospital Course: Patient was admitted to undergo surgical decompression arthrodesis at L3-L4.  He tolerated surgery well.  Consults: None  Significant Diagnostic Studies: None  Treatments: surgery: Anterolateral decompression L3-L4 with ex lift spacer with allograft, lateral plate fixation  Discharge Exam: Blood pressure (!) 149/93, pulse 64, temperature 98.5 F (36.9 C), temperature source Oral, resp. rate 18, height 5\' 7"  (1.702 m), weight 93 kg, SpO2 99 %. Incisions are clean and dry motor function is intact in the lower extremities.  Station and gait is normal  Disposition: Discharge disposition: 01-Home or Self Care       Discharge Instructions    Call MD for:  redness, tenderness, or signs of infection (pain, swelling, redness, odor or green/yellow discharge around incision site)   Complete by: As directed    Call MD for:  severe uncontrolled pain   Complete by: As directed    Call MD for:  temperature >100.4   Complete by: As directed    Diet - low sodium heart healthy   Complete by: As directed    Discharge instructions   Complete by: As directed    Okay to shower. Do not apply salves or appointments to incision. No heavy lifting with the upper extremities greater than 15 pounds. May resume driving when not requiring pain medication and patient feels comfortable with doing so.   Incentive spirometry RT   Complete by: As directed    Increase activity slowly   Complete by: As directed      Allergies as of 06/19/2019   No Known Allergies      Medication List    TAKE these medications   ALPRAZolam 1 MG tablet Commonly known as: XANAX Take 1 mg by mouth at bedtime as needed for sleep.   aspirin 325 MG EC tablet Take 1 tablet (325 mg total) by mouth 2 (two) times daily after a meal.   celecoxib 200 MG capsule Commonly known as: CELEBREX Take 1 capsule (200 mg total) by mouth 2 (two) times daily between meals as needed.   diazepam 5 MG tablet Commonly known as: VALIUM TAKE ONE TAB ONE HOUR PRIOR TO MRI REPEAT AS NEEDED #2 . ZERO REFILLS   hydrochlorothiazide 25 MG tablet Commonly known as: HYDRODIURIL Take 25 mg by mouth daily.   HYDROcodone-acetaminophen 5-325 MG tablet Commonly known as: NORCO/VICODIN TAKE 1 OR 2 TABLETS BY MOUTH EVERY 6 HOURS AS NEEDED FOR MODERATE PAIN What changed:   how much to take  how to take this  when to take this  reasons to take this  additional instructions   latanoprost 0.005 % ophthalmic solution Commonly known as: XALATAN Place 1 drop into both eyes at bedtime.   lisinopril 20 MG tablet Commonly known as: ZESTRIL Take 20 mg by mouth daily.   methocarbamol 500 MG tablet Commonly known as: ROBAXIN Take 1 tablet (500 mg total) by mouth every 6 (six) hours as needed for muscle spasms.   oxyCODONE-acetaminophen 5-325 MG tablet Commonly known as: PERCOCET/ROXICET Take 1-2 tablets by mouth every 4 (four) hours as needed for severe pain. What changed:   when to take this  reasons  to take this        Signed: Earleen Newport 06/19/2019, 1:12 PM

## 2019-06-25 ENCOUNTER — Ambulatory Visit (INDEPENDENT_AMBULATORY_CARE_PROVIDER_SITE_OTHER): Payer: Medicare Other | Admitting: Orthopaedic Surgery

## 2019-06-25 ENCOUNTER — Encounter: Payer: Medicare Other | Admitting: Physical Therapy

## 2019-06-25 ENCOUNTER — Encounter: Payer: Self-pay | Admitting: Orthopaedic Surgery

## 2019-06-25 DIAGNOSIS — S46012D Strain of muscle(s) and tendon(s) of the rotator cuff of left shoulder, subsequent encounter: Secondary | ICD-10-CM

## 2019-06-25 NOTE — Progress Notes (Signed)
The patient is now about 6 weeks status post a left shoulder arthroscopic rotator cuff repair.  He is doing well given the fact that he just had a significant lumbar spine surgery by Dr. Ellene Route on just 06/19/2019.  He says the shoulder is actually taken harder to recover from then the spine.  On exam he does have improved range of motion of his left shoulder and he is firing his rotator cuff.  He understands that it still can take 5 to 6 months to get full recovery from rotator cuff repair.  Overall though he looks like he is doing well.  He will increase his activities as comfort allows with that shoulder.  Follow-up at this point can be as needed and if he has any issues at all with the shoulder he will come back and see [place].  All question concerns were answered and addressed.

## 2019-06-28 DIAGNOSIS — M25612 Stiffness of left shoulder, not elsewhere classified: Secondary | ICD-10-CM | POA: Diagnosis present

## 2019-06-28 DIAGNOSIS — M25512 Pain in left shoulder: Secondary | ICD-10-CM | POA: Diagnosis not present

## 2019-06-28 DIAGNOSIS — M6281 Muscle weakness (generalized): Secondary | ICD-10-CM | POA: Diagnosis present

## 2019-06-29 ENCOUNTER — Ambulatory Visit: Payer: Medicare Other | Admitting: Physical Therapy

## 2019-06-29 ENCOUNTER — Other Ambulatory Visit: Payer: Self-pay

## 2019-06-29 ENCOUNTER — Encounter: Payer: Self-pay | Admitting: Physical Therapy

## 2019-06-29 DIAGNOSIS — M25512 Pain in left shoulder: Secondary | ICD-10-CM | POA: Diagnosis not present

## 2019-06-29 DIAGNOSIS — M25612 Stiffness of left shoulder, not elsewhere classified: Secondary | ICD-10-CM

## 2019-06-29 DIAGNOSIS — M6281 Muscle weakness (generalized): Secondary | ICD-10-CM

## 2019-06-29 NOTE — Therapy (Signed)
[facility] [address]  [place], [place], [address] Phone: [phone]   Fax:  [phone]  Physical Therapy Treatment  Patient Details  Name: Matthew [name] MRN: [id number] Date of Birth: [birth year] Referring Provider (PT): Jean Rosenthal, MD   Encounter Date: 06/29/2019  PT End of Session - 06/29/19 0845    Visit Number  6    Number of Visits  17    Date for PT Re-Evaluation  07/20/19    Authorization Type  UHC Medicare    PT Start Time  0803    PT Stop Time  0851    PT Time Calculation (min)  48 min    Equipment Utilized During Treatment  Back brace    Activity Tolerance  Patient tolerated treatment well    Behavior During Therapy  [facility] for tasks assessed/performed       Past Medical History:  Diagnosis Date  . Anxiety   . Arthritis    KNEES BACK SHOULDERS  . Cancer (HCC)    HX SKIN CANCER  . Heart murmur    "nothing to be concerned with, I ve had it all my life."  . Hx of nonmelanoma skin cancer   . Hypertension    FOLLOWED BY DR ED GREEN    Past Surgical History:  Procedure Laterality Date  . ANTERIOR LAT LUMBAR FUSION Left 06/18/2019   Procedure: Left Lumbar three-four Anterolateral lumbar decompression/interbody fusion with lateral plate fixation;  Surgeon: Kristeen Miss, MD;  Location: [facility];  Service: Neurosurgery;  Laterality: Left;  . BACK SURGERY  2010   L5S1 FUSION  . COLONOSCOPY  05/13/2019  . KNEE ARTHROPLASTY  2010   L  . KNEE ARTHROSCOPY     L X 2  . REPLACEMENT TOTAL KNEE Left 11/18/2008  . SHOULDER ARTHROSCOPY Left 05/14/2019  . TOTAL HIP ARTHROPLASTY Right 09/09/2015   Procedure: RIGHT TOTAL HIP ARTHROPLASTY ANTERIOR APPROACH;  Surgeon: Mcarthur Rossetti, MD;  Location: [facility];  Service: Orthopedics;  Laterality: Right;  . TOTAL KNEE ARTHROPLASTY  10/08/2011   Procedure: rightTOTAL KNEE ARTHROPLASTY;  Surgeon: Rudean Haskell, MD;  Location: [facility];  Service:  Orthopedics;  Laterality: Right;  TOTAL RIGHT KNEE ARTHROPLASTY     There were no vitals filed for this visit.  Subjective Assessment - 06/29/19 0804    Subjective  Patient reports that he underwent lumbar fusion on 06/18/19. Hasn't had any major issues with mobility. Has been continuing with shoulder HEP. Had a little setback when he lifted a cutting board and had a shooting pain up his biceps. This is getting better.    Pertinent History  HTN, hx skin CA, anxiety, TKA 2012, R THA 2016, L TKA 2010, L5-S1 fusion 2010    Diagnostic tests  03/26/19 L shoulder MRI: Large full-thickness retracted tear of the supraspinatus tendon; Probable avulsion of the long head of the biceps tendon with distal retraction    Patient Stated Goals  resume golf    Currently in Pain?  Yes    Pain Score  2     Pain Location  Shoulder    Pain Orientation  Left    Pain Descriptors / Indicators  Sore    Pain Type  Surgical pain                       [facility] Adult PT Treatment/Exercise - 06/29/19 0001      Shoulder Exercises: Supine  Protraction  AAROM;Left;10 reps;Strengthening    Protraction Limitations  x10; with wand; 10x with 2#   heavy correction of form   External Rotation  AAROM;Left;15 reps    External Rotation Limitations  with wand to tolerance    Internal Rotation  AAROM;Left;15 reps    Internal Rotation Limitations  with wand to tolerance    Flexion  AAROM;Left;10 reps    Flexion Limitations  with wand to tolerance   good ROM   ABduction  AAROM;Left;10 reps    ABduction Limitations  scaption/abduction with wand to tolerance   manual cues for proper plane of movement     Shoulder Exercises: Standing   External Rotation  Strengthening;Left;10 reps    Theraband Level (Shoulder External Rotation)  Level 1 (Yellow)    External Rotation Limitations  isometric stepouts with dowel under elbow    Internal Rotation  Strengthening;Left;10 reps    Theraband Level (Shoulder Internal  Rotation)  Level 1 (Yellow)    Internal Rotation Limitations  isometric stepouts with dowel under elbow    Row  Strengthening;Both;10 reps;Theraband    Theraband Level (Shoulder Row)  Level 1 (Yellow)    Row Limitations  cues for L shoulder depression      Shoulder Exercises: Isometric Strengthening   Flexion  5X10"    Flexion Limitations  elbow at side; 30% effort   using fingertips   External Rotation  5X10"    External Rotation Limitations  elbow at side; 30% effort    Internal Rotation  5X10"    Internal Rotation Limitations  elbow at side; 30% effort    ABduction  5X10"    ABduction Limitations  30% effort      Vasopneumatic   Number Minutes Vasopneumatic   10 minutes    Vasopnuematic Location   Shoulder   L   Vasopneumatic Pressure  Low    Vasopneumatic Temperature   coldest      Manual Therapy   Manual Therapy  Passive ROM    Manual therapy comments  supine    Joint Mobilization  L shoulder distraction grade III for improved comfort with PROM    Passive ROM  L shoulder PROM in flexion, scaption, ER, IR- kept within pain-free ROM with prolonged holds   good improvement in all planes with minimal pain            PT Education - 06/29/19 0844    Education Details  update to HEP; administered yellow TB    Person(s) Educated  Patient    Methods  Explanation;Demonstration;Tactile cues;Verbal cues;Handout    Comprehension  Verbalized understanding;Returned demonstration       PT Short Term Goals - 06/03/19 0848      PT SHORT TERM GOAL #1   Title  Patient to be independent with initial HEP.    Time  4    Period  Weeks    Status  Achieved    Target Date  06/22/19        PT Long Term Goals - 05/27/19 1234      PT LONG TERM GOAL #1   Title  Patient to be independent with advanced HEP.    Time  8    Period  Weeks    Status  On-going      PT LONG TERM GOAL #2   Title  Patient to demonstrate L shoulder AROM/PROM WFL and without pain limiting.    Time  8     Period  Weeks  Status  On-going      PT LONG TERM GOAL #3   Title  Patient to demonstrate L shoulder strength >=4+/5.    Time  8    Period  Weeks    Status  On-going      PT LONG TERM GOAL #4   Title  Patient to demonstrate L shoulder elevation to overhead shelf with 5lbs with good scapular mechanics and no evidence of compensations.    Time  8    Period  Weeks    Status  On-going      PT LONG TERM GOAL #5   Title  Patient to demonstrate lifting 10lb box from floor with good body mechanics and no pain.    Time  8    Period  Weeks    Status  On-going            Plan - 06/29/19 0845    Clinical Impression Statement  Patient returning to PT after undergoing L3-4 anterior lumbar fusion on 06/18/19. Reporting no pain, good functional activity tolerance, and mobility. Tolerated L shoulder PROM with good improvements in ROM in all planes. Introduced scaption/abduction and IR/ER AAROM with wand with cues for proper plane of movement, but patient tolerating both of these exercises well. Patient demonstrated improvement in neutral positioning with IR/ER step outs today. Introduced scapular row with cues to depress L shoulder with good carryover. Ended session with Gameready to L shoulder to ease post-exercise soreness. No complaints at end of session.    Comorbidities  HTN, hx skin CA, anxiety, TKA 2012, R THA 2016, L TKA 2010, L5-S1 fusion 2010    PT Treatment/Interventions  ADLs/Self Care Home Management;Cryotherapy;Electrical Stimulation;Moist Heat;Therapeutic exercise;Therapeutic activities;Functional mobility training;Ultrasound;Neuromuscular re-education;Patient/family education;Manual techniques;Vasopneumatic Device;Taping;Splinting;Energy conservation;Dry needling;Passive range of motion;Scar mobilization    PT Next Visit Plan  progress PROM/AAROM    Consulted and Agree with Plan of Care  Patient       Patient will benefit from skilled therapeutic intervention in order to  improve the following deficits and impairments:  Hypomobility, Increased edema, Decreased scar mobility, Decreased activity tolerance, Decreased strength, Impaired UE functional use, Pain, Decreased range of motion, Improper body mechanics, Postural dysfunction, Impaired flexibility  Visit Diagnosis: 1. Acute pain of left shoulder   2. Stiffness of left shoulder, not elsewhere classified   3. Muscle weakness (generalized)        Problem List Patient Active Problem List   Diagnosis Date Noted  . Lumbar stenosis with neurogenic claudication 06/18/2019  . Traumatic complete tear of left rotator cuff 05/04/2019  . Lumbar facet arthropathy 10/30/2017  . Osteoarthritis of right hip 09/09/2015  . Status post total replacement of right hip 09/09/2015  . Chest pain 02/05/2014  . Palpitations 02/05/2014  . Dyspnea 02/05/2014  . Dehydration 02/05/2014     Janene Harvey, PT, DPT 06/29/19 8:53 AM   [facility] [place], [place], [address] Phone: [phone]   Fax:  [phone]  Name: Matthew [name] MRN: [id number] Date of Birth: 1956-05-01

## 2019-07-02 ENCOUNTER — Other Ambulatory Visit: Payer: Self-pay

## 2019-07-02 ENCOUNTER — Ambulatory Visit: Payer: Medicare Other | Admitting: Physical Therapy

## 2019-07-02 ENCOUNTER — Encounter: Payer: Self-pay | Admitting: Physical Therapy

## 2019-07-02 DIAGNOSIS — M6281 Muscle weakness (generalized): Secondary | ICD-10-CM

## 2019-07-02 DIAGNOSIS — M25612 Stiffness of left shoulder, not elsewhere classified: Secondary | ICD-10-CM

## 2019-07-02 DIAGNOSIS — M25512 Pain in left shoulder: Secondary | ICD-10-CM | POA: Diagnosis not present

## 2019-07-02 NOTE — Therapy (Signed)
[facility] [address]  [place], [place], [address] Phone: [phone]   Fax:  [phone]  Physical Therapy Treatment  Patient Details  Name: Matthew [name] MRN: [id number] Date of Birth: Feb 27, 1956 Referring Provider (PT): Jean Rosenthal, MD   Encounter Date: 07/02/2019  PT End of Session - 07/02/19 1112    Visit Number  6    Number of Visits  17    Date for PT Re-Evaluation  07/20/19    Authorization Type  UHC Medicare    PT Start Time  0804    PT Stop Time  0858    PT Time Calculation (min)  54 min    Equipment Utilized During Treatment  Back brace    Activity Tolerance  Patient tolerated treatment well    Behavior During Therapy  [facility] for tasks assessed/performed       Past Medical History:  Diagnosis Date  . Anxiety   . Arthritis    KNEES BACK SHOULDERS  . Cancer (HCC)    HX SKIN CANCER  . Heart murmur    "nothing to be concerned with, I ve had it all my life."  . Hx of nonmelanoma skin cancer   . Hypertension    FOLLOWED BY DR ED GREEN    Past Surgical History:  Procedure Laterality Date  . ANTERIOR LAT LUMBAR FUSION Left 06/18/2019   Procedure: Left Lumbar three-four Anterolateral lumbar decompression/interbody fusion with lateral plate fixation;  Surgeon: Kristeen Miss, MD;  Location: [facility];  Service: Neurosurgery;  Laterality: Left;  . BACK SURGERY  2010   L5S1 FUSION  . COLONOSCOPY  05/13/2019  . KNEE ARTHROPLASTY  2010   L  . KNEE ARTHROSCOPY     L X 2  . REPLACEMENT TOTAL KNEE Left 11/18/2008  . SHOULDER ARTHROSCOPY Left 05/14/2019  . TOTAL HIP ARTHROPLASTY Right 09/09/2015   Procedure: RIGHT TOTAL HIP ARTHROPLASTY ANTERIOR APPROACH;  Surgeon: Mcarthur Rossetti, MD;  [facility];  Service: Orthopedics;  Laterality: Right;  . TOTAL KNEE ARTHROPLASTY  10/08/2011   Procedure: rightTOTAL KNEE ARTHROPLASTY;  Surgeon: Rudean Haskell, MD;  Location: [facility];  Service:  Orthopedics;  Laterality: Right;  TOTAL RIGHT KNEE ARTHROPLASTY     There were no vitals filed for this visit.  Subjective Assessment - 07/02/19 0807    Subjective  Reports no issues this AM.    Pertinent History  HTN, hx skin CA, anxiety, TKA 2012, R THA 2016, L TKA 2010, L5-S1 fusion 2010    Diagnostic tests  03/26/19 L shoulder MRI: Large full-thickness retracted tear of the supraspinatus tendon; Probable avulsion of the long head of the biceps tendon with distal retraction    Patient Stated Goals  resume golf    Currently in Pain?  Yes    Pain Score  3     Pain Location  Shoulder    Pain Orientation  Left    Pain Descriptors / Indicators  Sore    Pain Type  Surgical pain                       [facility] Adult PT Treatment/Exercise - 07/02/19 0001      Shoulder Exercises: Supine   Protraction  AAROM;Left;10 reps;Strengthening    Protraction Limitations  2x10 2#      Shoulder Exercises: Seated   External Rotation  AAROM;Left;10 reps    External Rotation Limitations  with wand to tolerance  towel roll to maintain elbow at side   Internal Rotation  AAROM;Left;10 reps    Internal Rotation Limitations  with wand to tolerance   towel roll to maintain elbow at side   Flexion  AAROM;Left;10 reps    Flexion Limitations  with wand to tolerance    Abduction  AAROM;Left;10 reps    ABduction Limitations  with wand to tolerance      Shoulder Exercises: Standing   External Rotation  Strengthening;Left;10 reps    Theraband Level (Shoulder External Rotation)  Level 1 (Yellow)    External Rotation Limitations  isometric stepouts with dowel under elbow    Internal Rotation  Strengthening;Left;10 reps    Theraband Level (Shoulder Internal Rotation)  Level 1 (Yellow)    Internal Rotation Limitations  isometric stepouts with dowel under elbow      Shoulder Exercises: Pulleys   Flexion  3 minutes    Flexion Limitations  to tolerance    Scaption  3 minutes    Scaption  Limitations  to tolerance       Vasopneumatic   Number Minutes Vasopneumatic   10 minutes    Vasopnuematic Location   Shoulder   L   Vasopneumatic Pressure  Low    Vasopneumatic Temperature   coldest      Manual Therapy   Soft tissue mobilization  STM to L biceps- significant tenderness and trigger pts throughout    Myofascial Release  manual TPR to L biceps muscle belly and proximal biceps tendon             PT Education - 07/02/19 1112    Education Details  update to HEP    Person(s) Educated  Patient    Methods  Explanation;Demonstration;Tactile cues;Verbal cues;Handout    Comprehension  Verbalized understanding;Returned demonstration       PT Short Term Goals - 06/03/19 0848      PT SHORT TERM GOAL #1   Title  Patient to be independent with initial HEP.    Time  4    Period  Weeks    Status  Achieved    Target Date  06/22/19        PT Long Term Goals - 05/27/19 1234      PT LONG TERM GOAL #1   Title  Patient to be independent with advanced HEP.    Time  8    Period  Weeks    Status  On-going      PT LONG TERM GOAL #2   Title  Patient to demonstrate L shoulder AROM/PROM WFL and without pain limiting.    Time  8    Period  Weeks    Status  On-going      PT LONG TERM GOAL #3   Title  Patient to demonstrate L shoulder strength >=4+/5.    Time  8    Period  Weeks    Status  On-going      PT LONG TERM GOAL #4   Title  Patient to demonstrate L shoulder elevation to overhead shelf with 5lbs with good scapular mechanics and no evidence of compensations.    Time  8    Period  Weeks    Status  On-going      PT LONG TERM GOAL #5   Title  Patient to demonstrate lifting 10lb box from floor with good body mechanics and no pain.    Time  8    Period  Weeks    Status  On-going            Plan - 07/02/19 1114    Clinical Impression Statement  Patient without issues this AM. Tolerated introduction of pulleys and sitting L shoulder AAROM with good ROM  and carryover of movement pattern. Patient reporting increased difficulty with the position change from supine to sitting AAROM, as expected. Updated HEP to include sitting AAROM- patient reported understanding. Patient still reporting soreness in L biceps after event when he tried to lift a heavy cutting board. Provided STM and manual TPR to L proximal biceps tendon and biceps muscle belly- multiple trigger points and tenderness evident in these areas. Ended session with Gameready to L shoulder for post-exercise soreness. No complaints at end of session.    Comorbidities  HTN, hx skin CA, anxiety, TKA 2012, R THA 2016, L TKA 2010, L5-S1 fusion 2010    PT Treatment/Interventions  ADLs/Self Care Home Management;Cryotherapy;Electrical Stimulation;Moist Heat;Therapeutic exercise;Therapeutic activities;Functional mobility training;Ultrasound;Neuromuscular re-education;Patient/family education;Manual techniques;Vasopneumatic Device;Taping;Splinting;Energy conservation;Dry needling;Passive range of motion;Scar mobilization    PT Next Visit Plan  progress AROM    Consulted and Agree with Plan of Care  Patient       Patient will benefit from skilled therapeutic intervention in order to improve the following deficits and impairments:  Hypomobility, Increased edema, Decreased scar mobility, Decreased activity tolerance, Decreased strength, Impaired UE functional use, Pain, Decreased range of motion, Improper body mechanics, Postural dysfunction, Impaired flexibility  Visit Diagnosis: Acute pain of left shoulder  Stiffness of left shoulder, not elsewhere classified  Muscle weakness (generalized)     Problem List Patient Active Problem List   Diagnosis Date Noted  . Lumbar stenosis with neurogenic claudication 06/18/2019  . Traumatic complete tear of left rotator cuff 05/04/2019  . Lumbar facet arthropathy 10/30/2017  . Osteoarthritis of right hip 09/09/2015  . Status post total replacement of right  hip 09/09/2015  . Chest pain 02/05/2014  . Palpitations 02/05/2014  . Dyspnea 02/05/2014  . Dehydration 02/05/2014     Janene Harvey, PT, DPT 07/02/19 11:17 AM   [facility] [place], [place], [address] Phone: [phone]   Fax:  [phone]  Name: Matthew [name] MRN: [id number] Date of Birth: 02/21/1956

## 2019-07-06 ENCOUNTER — Other Ambulatory Visit: Payer: Self-pay

## 2019-07-06 ENCOUNTER — Ambulatory Visit: Payer: Medicare Other

## 2019-07-06 DIAGNOSIS — M25512 Pain in left shoulder: Secondary | ICD-10-CM | POA: Diagnosis not present

## 2019-07-06 DIAGNOSIS — M6281 Muscle weakness (generalized): Secondary | ICD-10-CM

## 2019-07-06 DIAGNOSIS — M25612 Stiffness of left shoulder, not elsewhere classified: Secondary | ICD-10-CM

## 2019-07-06 NOTE — Therapy (Signed)
[facility] [address]  [place], [place], [address] Phone: [phone]   Fax:  [phone]  Physical Therapy Treatment  Patient Details  Name: Matthew [name] MRN: [id number] Date of Birth: [birth year] Referring Provider (PT): Jean Rosenthal, MD   Encounter Date: 07/06/2019  PT End of Session - 07/06/19 0809    Visit Number  8   visit count corrected from last session miscount   Number of Visits  17    Date for PT Re-Evaluation  07/20/19    Authorization Type  UHC Medicare    PT Start Time  0805    PT Stop Time  0855    PT Time Calculation (min)  50 min    Equipment Utilized During Treatment  Back brace    Activity Tolerance  Patient tolerated treatment well    Behavior During Therapy  [facility] for tasks assessed/performed       Past Medical History:  Diagnosis Date  . Anxiety   . Arthritis    KNEES BACK SHOULDERS  . Cancer (HCC)    HX SKIN CANCER  . Heart murmur    "nothing to be concerned with, I ve had it all my life."  . Hx of nonmelanoma skin cancer   . Hypertension    FOLLOWED BY DR ED GREEN    Past Surgical History:  Procedure Laterality Date  . ANTERIOR LAT LUMBAR FUSION Left 06/18/2019   Procedure: Left Lumbar three-four Anterolateral lumbar decompression/interbody fusion with lateral plate fixation;  Surgeon: Kristeen Miss, MD;  Location: [facility];  Service: Neurosurgery;  Laterality: Left;  . BACK SURGERY  2010   L5S1 FUSION  . COLONOSCOPY  05/13/2019  . KNEE ARTHROPLASTY  2010   L  . KNEE ARTHROSCOPY     L X 2  . REPLACEMENT TOTAL KNEE Left 11/18/2008  . SHOULDER ARTHROSCOPY Left 05/14/2019  . TOTAL HIP ARTHROPLASTY Right 09/09/2015   Procedure: RIGHT TOTAL HIP ARTHROPLASTY ANTERIOR APPROACH;  Surgeon: Mcarthur Rossetti, MD;  [facility];  Service: Orthopedics;  Laterality: Right;  . TOTAL KNEE ARTHROPLASTY  10/08/2011   Procedure: rightTOTAL KNEE ARTHROPLASTY;  Surgeon:  Rudean Haskell, MD;  Location: [facility];  Service: Orthopedics;  Laterality: Right;  TOTAL RIGHT KNEE ARTHROPLASTY     There were no vitals filed for this visit.  Subjective Assessment - 07/06/19 0808    Subjective  Pt. doing well with updated HEP .    Pertinent History  HTN, hx skin CA, anxiety, TKA 2012, R THA 2016, L TKA 2010, L5-S1 fusion 2010    Diagnostic tests  03/26/19 L shoulder MRI: Large full-thickness retracted tear of the supraspinatus tendon; Probable avulsion of the long head of the biceps tendon with distal retraction    Currently in Pain?  Yes    Pain Score  2     Pain Location  Shoulder    Pain Orientation  Left    Pain Descriptors / Indicators  Sore    Pain Type  Surgical pain    Multiple Pain Sites  No                       [facility] Adult PT Treatment/Exercise - 07/06/19 0001      Shoulder Exercises: Supine   Protraction  15 reps    Protraction Limitations  focusing on motion       Shoulder Exercises: Seated   External Rotation  AAROM;Left;10 reps  Shoulder Exercises: Sidelying   External Rotation  Left;10 reps;AROM    External Rotation Limitations  well tolerated       Shoulder Exercises: Standing   Horizontal ABduction  Both;10 reps;Theraband;Strengthening    Theraband Level (Shoulder Horizontal ABduction)  Level 1 (Yellow)    Horizontal ABduction Limitations  Performed in 45 dg abduction plane - pain free   cues required to avoid excessive scap. elevation    Extension  Both;10 reps;Theraband;Strengthening    Theraband Level (Shoulder Extension)  Level 1 (Yellow)    Extension Limitations  cues for scap retraction     Row  15 reps;Theraband;Strengthening    Theraband Level (Shoulder Row)  Level 1 (Yellow)      Shoulder Exercises: Pulleys   Flexion  3 minutes    Flexion Limitations  to tolerance    Scaption  3 minutes    Scaption Limitations  to tolerance       Vasopneumatic   Number Minutes Vasopneumatic   10 minutes     Vasopnuematic Location   Shoulder    Vasopneumatic Pressure  Low    Vasopneumatic Temperature   coldest      Neck Exercises: Stretches   Upper Trapezius Stretch  Left;1 rep;30 seconds    Levator Stretch  Left;1 rep;30 seconds               PT Short Term Goals - 06/03/19 0848      PT SHORT TERM GOAL #1   Title  Patient to be independent with initial HEP.    Time  4    Period  Weeks    Status  Achieved    Target Date  06/22/19        PT Long Term Goals - 05/27/19 1234      PT LONG TERM GOAL #1   Title  Patient to be independent with advanced HEP.    Time  8    Period  Weeks    Status  On-going      PT LONG TERM GOAL #2   Title  Patient to demonstrate L shoulder AROM/PROM WFL and without pain limiting.    Time  8    Period  Weeks    Status  On-going      PT LONG TERM GOAL #3   Title  Patient to demonstrate L shoulder strength >=4+/5.    Time  8    Period  Weeks    Status  On-going      PT LONG TERM GOAL #4   Title  Patient to demonstrate L shoulder elevation to overhead shelf with 5lbs with good scapular mechanics and no evidence of compensations.    Time  8    Period  Weeks    Status  On-going      PT LONG TERM GOAL #5   Title  Patient to demonstrate lifting 10lb box from floor with good body mechanics and no pain.    Time  8    Period  Weeks    Status  On-going            Plan - 07/06/19 0818    Clinical Impression Statement  Pt. doing well today.  Feels updated HEP doing well without complaints.  Tolerated addition of yellow TB resisted shoulder extension to neutral, sidelying ER, and mild progression in banded row well.  No complaints of pain during therex today.  Did have some L shoulder fatigue/discomfort to end session with applied ice/compression to L  shoulder to reduce post-exercise swelling.  Progressing well per protocol.    Personal Factors and Comorbidities  Age;Behavior Pattern;Time since onset of  injury/illness/exacerbation;Comorbidity 3+;Past/Current Experience    Comorbidities  HTN, hx skin CA, anxiety, TKA 2012, R THA 2016, L TKA 2010, L5-S1 fusion 2010    Rehab Potential  Good    PT Treatment/Interventions  ADLs/Self Care Home Management;Cryotherapy;Electrical Stimulation;Moist Heat;Therapeutic exercise;Therapeutic activities;Functional mobility training;Ultrasound;Neuromuscular re-education;Patient/family education;Manual techniques;Vasopneumatic Device;Taping;Splinting;Energy conservation;Dry needling;Passive range of motion;Scar mobilization    PT Next Visit Plan  progress AROM    Consulted and Agree with Plan of Care  Patient       Patient will benefit from skilled therapeutic intervention in order to improve the following deficits and impairments:  Hypomobility, Increased edema, Decreased scar mobility, Decreased activity tolerance, Decreased strength, Impaired UE functional use, Pain, Decreased range of motion, Improper body mechanics, Postural dysfunction, Impaired flexibility  Visit Diagnosis: Acute pain of left shoulder  Stiffness of left shoulder, not elsewhere classified  Muscle weakness (generalized)     Problem List Patient Active Problem List   Diagnosis Date Noted  . Lumbar stenosis with neurogenic claudication 06/18/2019  . Traumatic complete tear of left rotator cuff 05/04/2019  . Lumbar facet arthropathy 10/30/2017  . Osteoarthritis of right hip 09/09/2015  . Status post total replacement of right hip 09/09/2015  . Chest pain 02/05/2014  . Palpitations 02/05/2014  . Dyspnea 02/05/2014  . Dehydration 02/05/2014    Bess Harvest, PTA 07/06/19 12:18 PM   [facility] [address]  [place], [place], [address] Phone: [phone]   Fax:  [phone]  Name: Matthew [name] MRN: [id number] Date of Birth: 1956-06-27

## 2019-07-09 ENCOUNTER — Encounter: Payer: Self-pay | Admitting: Physical Therapy

## 2019-07-09 ENCOUNTER — Other Ambulatory Visit: Payer: Self-pay

## 2019-07-09 ENCOUNTER — Ambulatory Visit: Payer: Medicare Other | Admitting: Physical Therapy

## 2019-07-09 DIAGNOSIS — M25512 Pain in left shoulder: Secondary | ICD-10-CM | POA: Diagnosis not present

## 2019-07-09 DIAGNOSIS — M6281 Muscle weakness (generalized): Secondary | ICD-10-CM

## 2019-07-09 DIAGNOSIS — M25612 Stiffness of left shoulder, not elsewhere classified: Secondary | ICD-10-CM

## 2019-07-09 NOTE — Therapy (Signed)
[facility] [address]  [place], [place], [address] Phone: [phone]   Fax:  [phone]  Physical Therapy Treatment  Patient Details  Name: Matthew [name] MRN: [id number] Date of Birth: 1956-02-26 Referring Provider (PT): Jean Rosenthal, MD   Encounter Date: 07/09/2019  PT End of Session - 07/09/19 0802    Visit Number  9    Number of Visits  17    Date for PT Re-Evaluation  07/20/19    Authorization Type  [employer] Medicare    PT Start Time  0759    PT Stop Time  0855    PT Time Calculation (min)  56 min    Equipment Utilized During Treatment  Back brace    Activity Tolerance  Patient tolerated treatment well    Behavior During Therapy  [facility] for tasks assessed/performed       Past Medical History:  Diagnosis Date  . Anxiety   . Arthritis    KNEES BACK SHOULDERS  . Cancer (HCC)    HX SKIN CANCER  . Heart murmur    "nothing to be concerned with, I ve had it all my life."  . Hx of nonmelanoma skin cancer   . Hypertension    FOLLOWED BY DR ED GREEN    Past Surgical History:  Procedure Laterality Date  . ANTERIOR LAT LUMBAR FUSION Left 06/18/2019   Procedure: Left Lumbar three-four Anterolateral lumbar decompression/interbody fusion with lateral plate fixation;  Surgeon: Kristeen Miss, MD;  Location: [facility];  Service: Neurosurgery;  Laterality: Left;  . BACK SURGERY  2010   L5S1 FUSION  . COLONOSCOPY  05/13/2019  . KNEE ARTHROPLASTY  2010   L  . KNEE ARTHROSCOPY     L X 2  . REPLACEMENT TOTAL KNEE Left 11/18/2008  . SHOULDER ARTHROSCOPY Left 05/14/2019  . TOTAL HIP ARTHROPLASTY Right 09/09/2015   Procedure: RIGHT TOTAL HIP ARTHROPLASTY ANTERIOR APPROACH;  Surgeon: Mcarthur Rossetti, MD;  Location: [facility];  Service: Orthopedics;  Laterality: Right;  . TOTAL KNEE ARTHROPLASTY  10/08/2011   Procedure: rightTOTAL KNEE ARTHROPLASTY;  Surgeon: Rudean Haskell, MD;  Location: [facility];  Service:  Orthopedics;  Laterality: Right;  TOTAL RIGHT KNEE ARTHROPLASTY     There were no vitals filed for this visit.  Subjective Assessment - 07/09/19 0800    Subjective  Things are going well.    Pertinent History  HTN, hx skin CA, anxiety, TKA 2012, R THA 2016, L TKA 2010, L5-S1 fusion 2010    Diagnostic tests  03/26/19 L shoulder MRI: Large full-thickness retracted tear of the supraspinatus tendon; Probable avulsion of the long head of the biceps tendon with distal retraction    Patient Stated Goals  resume golf    Currently in Pain?  No/denies                       [facility] Adult PT Treatment/Exercise - 07/09/19 0001      Shoulder Exercises: Supine   Protraction  Strengthening;Both;10 reps;Weights    Protraction Weight (lbs)  3, 4    Protraction Limitations  1st set 3#, 2nd set 4#; good form    Horizontal ABduction  Strengthening;Both;10 reps;Theraband    Theraband Level (Shoulder Horizontal ABduction)  Level 1 (Yellow)    Horizontal ABduction Limitations  2x10; cues for control with both phases of movement      Shoulder Exercises: Seated   Flexion  AROM;Left;10 reps  Flexion Limitations  cues to avoid L shoulder hiking     Abduction  AROM;Left;10 reps    ABduction Limitations  cues to avoid L shoulder hiking     Other Seated Exercises  L shoulder scaption x10   cues to avoid L shoulder hiking      Shoulder Exercises: Standing   External Rotation  Strengthening;Left;10 reps    Theraband Level (Shoulder External Rotation)  Level 1 (Yellow)    External Rotation Limitations  with towel roll under elbow; cues to stop at neutral   cues for upright posture   Internal Rotation  Strengthening;Left;10 reps    Theraband Level (Shoulder Internal Rotation)  Level 1 (Yellow)    Internal Rotation Limitations  with towel roll under elbow; cues to stop at neutral    Row  Strengthening;Both;Theraband;15 reps    Theraband Level (Shoulder Row)  Level 2 (Red)    Row Limitations   good form      Shoulder Exercises: ROM/Strengthening   UBE (Upper Arm Bike)  L1 x 39min forward/3 min back      Vasopneumatic   Number Minutes Vasopneumatic   10 minutes    Vasopnuematic Location   Shoulder   L   Vasopneumatic Pressure  Low    Vasopneumatic Temperature   coldest      Manual Therapy   Soft tissue mobilization  STM to L biceps muscle belly and pec- still with significant tenderness and trigger pts throughout the biceps    Myofascial Release  manual TPR to L biceps muscle belly              PT Education - 07/09/19 1214    Education Details  update to HEP    Person(s) Educated  Patient    Methods  Explanation;Demonstration;Tactile cues;Verbal cues;Handout    Comprehension  Verbalized understanding;Returned demonstration       PT Short Term Goals - 06/03/19 0848      PT SHORT TERM GOAL #1   Title  Patient to be independent with initial HEP.    Time  4    Period  Weeks    Status  Achieved    Target Date  06/22/19        PT Long Term Goals - 05/27/19 1234      PT LONG TERM GOAL #1   Title  Patient to be independent with advanced HEP.    Time  8    Period  Weeks    Status  On-going      PT LONG TERM GOAL #2   Title  Patient to demonstrate L shoulder AROM/PROM WFL and without pain limiting.    Time  8    Period  Weeks    Status  On-going      PT LONG TERM GOAL #3   Title  Patient to demonstrate L shoulder strength >=4+/5.    Time  8    Period  Weeks    Status  On-going      PT LONG TERM GOAL #4   Title  Patient to demonstrate L shoulder elevation to overhead shelf with 5lbs with good scapular mechanics and no evidence of compensations.    Time  8    Period  Weeks    Status  On-going      PT LONG TERM GOAL #5   Title  Patient to demonstrate lifting 10lb box from floor with good body mechanics and no pain.    Time  8  Period  Weeks    Status  On-going            Plan - 07/09/19 0802    Clinical Impression Statement  Patient  arrived to session with no new complaints. Introduced sitting L shoulder AROM with cues to avoid shoulder hiking as compensation. Patient tolerated this well, and with improvement in ROM with each subsequent rep. Able to tolerate increased weighted resistance with serratus punches with good form. Patient reporting tightness in L shoulder, thus provided STM and TPR to L biceps muscle belly. Patient still with considerable tender trigger points throughout this muscle which improved with manual therapy. Worked on periscapular strengthening in supine to help avoid shoulder hiking, which patient tolerated well. Updated HEP with exercises that were well-tolerated today- patient reported understanding. Ended session with [place] to L shoulder per patient's request. No complaints at end of session.    Comorbidities  HTN, hx skin CA, anxiety, TKA 2012, R THA 2016, L TKA 2010, L5-S1 fusion 2010    PT Treatment/Interventions  ADLs/Self Care Home Management;Cryotherapy;Electrical Stimulation;Moist Heat;Therapeutic exercise;Therapeutic activities;Functional mobility training;Ultrasound;Neuromuscular re-education;Patient/family education;Manual techniques;Vasopneumatic Device;Taping;Splinting;Energy conservation;Dry needling;Passive range of motion;Scar mobilization    PT Next Visit Plan  progress AROM    Consulted and Agree with Plan of Care  Patient       Patient will benefit from skilled therapeutic intervention in order to improve the following deficits and impairments:  Hypomobility, Increased edema, Decreased scar mobility, Decreased activity tolerance, Decreased strength, Impaired UE functional use, Pain, Decreased range of motion, Improper body mechanics, Postural dysfunction, Impaired flexibility  Visit Diagnosis: Acute pain of left shoulder  Stiffness of left shoulder, not elsewhere classified  Muscle weakness (generalized)     Problem List Patient Active Problem List   Diagnosis Date Noted  .  Lumbar stenosis with neurogenic claudication 06/18/2019  . Traumatic complete tear of left rotator cuff 05/04/2019  . Lumbar facet arthropathy 10/30/2017  . Osteoarthritis of right hip 09/09/2015  . Status post total replacement of right hip 09/09/2015  . Chest pain 02/05/2014  . Palpitations 02/05/2014  . Dyspnea 02/05/2014  . Dehydration 02/05/2014    Janene Harvey, PT, DPT 07/09/19 12:19 PM   [facility] [address]  [place], [place], [address] Phone: [phone]   Fax:  [phone]  Name: Matthew [name] MRN: [id number] Date of Birth: 01/23/1956

## 2019-07-13 ENCOUNTER — Other Ambulatory Visit: Payer: Self-pay

## 2019-07-13 ENCOUNTER — Encounter: Payer: Self-pay | Admitting: Physical Therapy

## 2019-07-13 ENCOUNTER — Ambulatory Visit: Payer: Medicare Other | Admitting: Physical Therapy

## 2019-07-13 DIAGNOSIS — M6281 Muscle weakness (generalized): Secondary | ICD-10-CM

## 2019-07-13 DIAGNOSIS — M25512 Pain in left shoulder: Secondary | ICD-10-CM

## 2019-07-13 DIAGNOSIS — M25612 Stiffness of left shoulder, not elsewhere classified: Secondary | ICD-10-CM

## 2019-07-13 NOTE — Therapy (Signed)
[facility] [address]  [place], [place], [address] Phone: [phone]   Fax:  [phone]  Physical Therapy Progress Note  Patient Details  Name: Matthew [name] MRN: [id number] Date of Birth: 1956-08-14 Referring Provider (PT): Jean Rosenthal, MD    Progress Note Reporting Period 05/25/19 to 07/13/19  See note below for Objective Data and Assessment of Progress/Goals.    Encounter Date: 07/13/2019  PT End of Session - 07/13/19 1213    Visit Number  10    Number of Visits  16    Date for PT Re-Evaluation  08/24/19    Authorization Type  [employer] Medicare    PT Start Time  0846    PT Stop Time  0928    PT Time Calculation (min)  42 min    Equipment Utilized During Treatment  Back brace    Activity Tolerance  Patient tolerated treatment well    Behavior During Therapy  WFL for tasks assessed/performed       Past Medical History:  Diagnosis Date  . Anxiety   . Arthritis    KNEES BACK SHOULDERS  . Cancer (HCC)    HX SKIN CANCER  . Heart murmur    "nothing to be concerned with, I ve had it all my life."  . Hx of nonmelanoma skin cancer   . Hypertension    FOLLOWED BY DR ED GREEN    Past Surgical History:  Procedure Laterality Date  . ANTERIOR LAT LUMBAR FUSION Left 06/18/2019   Procedure: Left Lumbar three-four Anterolateral lumbar decompression/interbody fusion with lateral plate fixation;  Surgeon: Kristeen Miss, MD;  Location: [facility];  Service: Neurosurgery;  Laterality: Left;  . BACK SURGERY  2010   L5S1 FUSION  . COLONOSCOPY  05/13/2019  . KNEE ARTHROPLASTY  2010   L  . KNEE ARTHROSCOPY     L X 2  . REPLACEMENT TOTAL KNEE Left 11/18/2008  . SHOULDER ARTHROSCOPY Left 05/14/2019  . TOTAL HIP ARTHROPLASTY Right 09/09/2015   Procedure: RIGHT TOTAL HIP ARTHROPLASTY ANTERIOR APPROACH;  Surgeon: Mcarthur Rossetti, MD;  Location: [facility];  Service: Orthopedics;  Laterality: Right;  . TOTAL  KNEE ARTHROPLASTY  10/08/2011   Procedure: rightTOTAL KNEE ARTHROPLASTY;  Surgeon: Rudean Haskell, MD;  Location: [facility];  Service: Orthopedics;  Laterality: Right;  TOTAL RIGHT KNEE ARTHROPLASTY     There were no vitals filed for this visit.  Subjective Assessment - 07/13/19 0850    Subjective  Reports that he is doing well. Notes 75% improvement in L shoulder. Would like to continue working on reaching overhead and reduce tension in his muscles.    Pertinent History  HTN, hx skin CA, anxiety, TKA 2012, R THA 2016, L TKA 2010, L5-S1 fusion 2010    Diagnostic tests  03/26/19 L shoulder MRI: Large full-thickness retracted tear of the supraspinatus tendon; Probable avulsion of the long head of the biceps tendon with distal retraction    Patient Stated Goals  resume golf    Currently in Pain?  Yes    Pain Score  1     Pain Location  Shoulder    Pain Orientation  Left    Pain Descriptors / Indicators  Sore    Pain Type  Surgical pain;Acute pain         [facility] PT Assessment - 07/13/19 0001      Assessment   Medical Diagnosis  s/p arthroscopy of L shoulder; RTC repair  of full-thickness complete tear of supraspinatus    Referring Provider (PT)  Jean Rosenthal, MD    Onset Date/Surgical Date  05/14/19      Observation/Other Assessments   Focus on Therapeutic Outcomes (FOTO)   Shoulder: 50 (40% limited, 24% predicted)      AROM   Right/Left Shoulder  Left    Left Shoulder Flexion  130 Degrees    Left Shoulder ABduction  86 Degrees   mild pain   Left Shoulder Internal Rotation  --   FER T12 mild pain   Left Shoulder External Rotation  --   FER to top of head     PROM   Left Shoulder Flexion  141 Degrees    Left Shoulder ABduction  128 Degrees    Left Shoulder Internal Rotation  72 Degrees    Left Shoulder External Rotation  62 Degrees      Strength   Right/Left Shoulder  Left    Right Shoulder Flexion  4/5    Right Shoulder ABduction  4-/5    Right Shoulder Internal  Rotation  4-/5    Right Shoulder External Rotation  4-/5                   [facility] Adult PT Treatment/Exercise - 07/13/19 0001      Neck Exercises: Seated   Other Seated Exercise  L LS stretch x30"    Other Seated Exercise  L UT stretch x30"      Shoulder Exercises: Seated   Abduction  Left;10 reps;AAROM    ABduction Limitations  with wand; cues for abduction rather than scaption      Shoulder Exercises: ROM/Strengthening   UBE (Upper Arm Bike)  L1 x 35mn forward/3 min back      Manual Therapy   Soft tissue mobilization  STM to L UT     Myofascial Release  manual TPR to L UT    Passive ROM  L shoulder PROM in all planes to tolerance; most limited in abduction             PT Education - 07/13/19 1213    Education Details  discussion on objective progress thus far, edu on avoiding repetitive overhead activities with L UE    Person(s) Educated  Patient    Methods  Explanation;Demonstration    Comprehension  Verbalized understanding;Returned demonstration       PT Short Term Goals - 07/13/19 0854      PT SHORT TERM GOAL #1   Title  Patient to be independent with initial HEP.    Time  4    Period  Weeks    Status  Achieved    Target Date  06/22/19        PT Long Term Goals - 07/13/19 0854      PT LONG TERM GOAL #1   Title  Patient to be independent with advanced HEP.    Time  6    Period  Weeks    Status  Partially Met   met for current   Target Date  08/24/19      PT LONG TERM GOAL #2   Title  Patient to demonstrate [facility] pain limiting.    Time  6    Period  Weeks    Status  Partially Met   improvement in all planes of L shoulder PROM, and able to tolerate measurement for AROM in all planes   Target Date  08/24/19      PT LONG TERM GOAL #3   Title  Patient to demonstrate L shoulder strength >=4+/5.    Time  6    Period  Weeks    Status  On-going   able to tolerate strength testing, but still showing weakness  in all muscle groups   Target Date  08/24/19      PT LONG TERM GOAL #4   Title  Patient to demonstrate L shoulder elevation to overhead shelf with 5lbs with good scapular mechanics and no evidence of compensations.    Time  6    Period  Weeks    Status  Partially Met   able to perform overhead elevation to shelf without weight and with mild shoulder hiking   Target Date  08/24/19      PT LONG TERM GOAL #5   Title  Patient to demonstrate lifting 10lb box from floor with good body mechanics and no pain.    Time  6    Period  Weeks    Status  Deferred   NT d/t recent lumbar fusion   Target Date  08/24/19            Plan - 07/13/19 1218    Clinical Impression Statement  Patient reporting 75% improvement in L shoulder since initial eval. Notes that he would like to continue working towards reaching overhead and reducing pain and tension in UE musculature. Admits to removing wallpaper and climbing a ladder to do so over the weekend- aware that this is not an activity he should have been doing s/p shoulder and lumbar surgeries. Patient has shown improvement in all planes of L shoulder PROM, and able to tolerate measurement for AROM in all planes. Most limited in abduction ROM at this time. Able to tolerate strength testing, but still showing weakness in all muscle groups. Patient performed overhead elevation to shelf without weight and with mild shoulder hiking, indication compensatory strategy. Lifting goal not assessed d/t recent lumbar fusion. Patient is showing steady progress, especially considering the severity of his RTC tear. Would benefit from continued skilled PT services 1x/week for 6 weeks to address strengthening and lifting.    Comorbidities  HTN, hx skin CA, anxiety, TKA 2012, R THA 2016, L TKA 2010, L5-S1 fusion 2010    PT Frequency  1x / week    PT Duration  6 weeks    PT Treatment/Interventions  ADLs/Self Care Home Management;Cryotherapy;Electrical Stimulation;Moist  Heat;Therapeutic exercise;Therapeutic activities;Functional mobility training;Ultrasound;Neuromuscular re-education;Patient/family education;Manual techniques;Vasopneumatic Device;Taping;Splinting;Energy conservation;Dry needling;Passive range of motion;Scar mobilization    PT Next Visit Plan  progress AROM    Consulted and Agree with Plan of Care  Patient       Patient will benefit from skilled therapeutic intervention in order to improve the following deficits and impairments:  Hypomobility, Increased edema, Decreased scar mobility, Decreased activity tolerance, Decreased strength, Impaired UE functional use, Pain, Decreased range of motion, Improper body mechanics, Postural dysfunction, Impaired flexibility  Visit Diagnosis: Acute pain of left shoulder  Stiffness of left shoulder, not elsewhere classified  Muscle weakness (generalized)     Problem List Patient Active Problem List   Diagnosis Date Noted  . Lumbar stenosis with neurogenic claudication 06/18/2019  . Traumatic complete tear of left rotator cuff 05/04/2019  . Lumbar facet arthropathy 10/30/2017  . Osteoarthritis of right hip 09/09/2015  . Status post total replacement of right hip 09/09/2015  . Chest pain 02/05/2014  . Palpitations 02/05/2014  . Dyspnea 02/05/2014  .  Dehydration 02/05/2014     Janene Harvey, PT, DPT 07/13/19 12:22 PM   [facility] [address]  [place], [place], [address] Phone: [phone]   Fax:  [phone]  Name: Matthew [name] MRN: [id number] Date of Birth: September 08, 1956

## 2019-07-16 ENCOUNTER — Encounter: Payer: Self-pay | Admitting: Physical Therapy

## 2019-07-16 ENCOUNTER — Ambulatory Visit: Payer: Medicare Other | Attending: Orthopaedic Surgery | Admitting: Physical Therapy

## 2019-07-16 ENCOUNTER — Other Ambulatory Visit: Payer: Self-pay

## 2019-07-16 DIAGNOSIS — M25612 Stiffness of left shoulder, not elsewhere classified: Secondary | ICD-10-CM | POA: Diagnosis present

## 2019-07-16 DIAGNOSIS — M6281 Muscle weakness (generalized): Secondary | ICD-10-CM | POA: Insufficient documentation

## 2019-07-16 DIAGNOSIS — M25512 Pain in left shoulder: Secondary | ICD-10-CM

## 2019-07-16 NOTE — Therapy (Signed)
[facility] [address]  [place], [place], [address] Phone: [phone]   Fax:  [phone]  Physical Therapy Treatment  Patient Details  Name: Matthew [name] MRN: [id number] Date of Birth: [birth year] Referring Provider (PT): Jean Rosenthal, MD   Encounter Date: 07/16/2019  PT End of Session - 07/16/19 1014    Visit Number  11    Number of Visits  16    Date for PT Re-Evaluation  08/24/19    Authorization Type  [employer] Medicare    PT Start Time  0931    PT Stop Time  1014    PT Time Calculation (min)  43 min    Activity Tolerance  Patient tolerated treatment well    Behavior During Therapy  [facility] for tasks assessed/performed       Past Medical History:  Diagnosis Date  . Anxiety   . Arthritis    KNEES BACK SHOULDERS  . Cancer (HCC)    HX SKIN CANCER  . Heart murmur    "nothing to be concerned with, I ve had it all my life."  . Hx of nonmelanoma skin cancer   . Hypertension    FOLLOWED BY DR ED GREEN    Past Surgical History:  Procedure Laterality Date  . ANTERIOR LAT LUMBAR FUSION Left 06/18/2019   Procedure: Left Lumbar three-four Anterolateral lumbar decompression/interbody fusion with lateral plate fixation;  Surgeon: Kristeen Miss, MD;  Location: [facility];  Service: Neurosurgery;  Laterality: Left;  . BACK SURGERY  2010   L5S1 FUSION  . COLONOSCOPY  05/13/2019  . KNEE ARTHROPLASTY  2010   L  . KNEE ARTHROSCOPY     L X 2  . REPLACEMENT TOTAL KNEE Left 11/18/2008  . SHOULDER ARTHROSCOPY Left 05/14/2019  . TOTAL HIP ARTHROPLASTY Right 09/09/2015   Procedure: RIGHT TOTAL HIP ARTHROPLASTY ANTERIOR APPROACH;  Surgeon: Mcarthur Rossetti, MD;  Location: [facility];  Service: Orthopedics;  Laterality: Right;  . TOTAL KNEE ARTHROPLASTY  10/08/2011   Procedure: rightTOTAL KNEE ARTHROPLASTY;  Surgeon: Rudean Haskell, MD;  Location: [facility];  Service: Orthopedics;  Laterality: Right;  TOTAL RIGHT KNEE  ARTHROPLASTY     There were no vitals filed for this visit.  Subjective Assessment - 07/16/19 0934    Subjective  His shoulder is doing good. Is able to do more and more.    Pertinent History  HTN, hx skin CA, anxiety, TKA 2012, R THA 2016, L TKA 2010, L5-S1 fusion 2010    Diagnostic tests  03/26/19 L shoulder MRI: Large full-thickness retracted tear of the supraspinatus tendon; Probable avulsion of the long head of the biceps tendon with distal retraction    Patient Stated Goals  resume golf    Currently in Pain?  No/denies                       [facility] Adult PT Treatment/Exercise - 07/16/19 0001      Shoulder Exercises: Seated   Horizontal ABduction  Strengthening;Both;10 reps;Theraband    Theraband Level (Shoulder Horizontal ABduction)  Level 1 (Yellow)    Horizontal ABduction Limitations  2x10; cues for increased control and shoulder depression    External Rotation  Strengthening;Both;10 reps;Theraband    Theraband Level (Shoulder External Rotation)  Level 1 (Yellow)    External Rotation Limitations  slightly decreased ROM on L    Flexion  Strengthening;Left;10 reps;Weights    Flexion Weight (lbs)  1  Flexion Limitations  within limited ROM d/t shoulder hiking    Abduction  Strengthening;Left;10 reps;Weights    ABduction Weight (lbs)  1    ABduction Limitations  within limited ROM d/t shoulder hiking    Other Seated Exercises  L shoulder scaption 1# x10   manual cues for scapular upward rotation and depression     Shoulder Exercises: Sidelying   External Rotation  Left;10 reps;AROM    External Rotation Weight (lbs)  1,2    External Rotation Limitations  1st set 1#, 2nd set 2#;towel under elbow    ABduction  Strengthening;Left;10 reps;Weights    ABduction Limitations  1st set 0#, 2nd set 1#   c/o L biceps pain at end range     Shoulder Exercises: Standing   External Rotation  Strengthening;Left;10 reps    Theraband Level (Shoulder External Rotation)  Level  2 (Red)    External Rotation Limitations  with towel roll under elbow; cues to stop at neutral    Internal Rotation  Strengthening;Left;10 reps    Theraband Level (Shoulder Internal Rotation)  Level 2 (Red)    Internal Rotation Limitations  with towel roll under elbow; cues to stop at neutral    [employer];Theraband;15 reps    Theraband Level (Shoulder Row)  Level 2 (Red)    Row Limitations  cues to maintain R shoulder down and back      Shoulder Exercises: ROM/Strengthening   UBE (Upper Arm Bike)  L1 x 58mn forward/3 min back             PT Education - 07/16/19 1013    Education Details  update to HEP    Person(s) Educated  Patient    Methods  Explanation;Demonstration;Tactile cues;Verbal cues;Handout    Comprehension  Verbalized understanding;Returned demonstration       PT Short Term Goals - 07/13/19 0854      PT SHORT TERM GOAL #1   Title  Patient to be independent with initial HEP.    Time  4    Period  Weeks    Status  Achieved    Target Date  06/22/19        PT Long Term Goals - 07/13/19 0854      PT LONG TERM GOAL #1   Title  Patient to be independent with advanced HEP.    Time  6    Period  Weeks    Status  Partially Met   met for current   Target Date  08/24/19      PT LONG TERM GOAL #2   Title  Patient to demonstrate [facility] shoulder [facility] without pain limiting.    Time  6    Period  Weeks    Status  Partially Met   improvement in all planes of L shoulder PROM, and able to tolerate measurement for AROM in all planes   Target Date  08/24/19      PT LONG TERM GOAL #3   Title  Patient to demonstrate L shoulder strength >=4+/5.    Time  6    Period  Weeks    Status  On-going   able to tolerate strength testing, but still showing weakness in all muscle groups   Target Date  08/24/19      PT LONG TERM GOAL #4   Title  Patient to demonstrate L shoulder elevation to overhead shelf with 5lbs with good scapular mechanics and no  evidence of compensations.    Time  6    Period  Weeks    Status  Partially Met   able to perform overhead elevation to shelf without weight and with mild shoulder hiking   Target Date  08/24/19      PT LONG TERM GOAL #5   Title  Patient to demonstrate lifting 10lb box from floor with good body mechanics and no pain.    Time  6    Period  Weeks    Status  Deferred   NT d/t recent lumbar fusion   Target Date  08/24/19            Plan - 07/16/19 1156    Clinical Impression Statement  Patient without complaints at beginning of session. Tolerated addition of light weighted resistance with overhead movements. Verbal and manual cues required to correct L shoulder hiking and promote upward scapular rotation and depression. Better able to demonstrate good form with sitting horizontal abduction and ER today, albeit with slightly decreased ROM on L UE. Excellent form and tolerance demonstrated with sidelying ER- today able to tolerate addition of 2lbs. Did c/o L biceps pain at end range of sidelying overhead abduction, which resolved with increased reps. Tolerated increased banded resistance with periscapular and RTC strengthening, thus administered red TB for HEP. Patient reported understanding and with no complaints at end of session.    Comorbidities  HTN, hx skin CA, anxiety, TKA 2012, R THA 2016, L TKA 2010, L5-S1 fusion 2010    PT Frequency  1x / week    PT Duration  6 weeks    PT Treatment/Interventions  ADLs/Self Care Home Management;Cryotherapy;Electrical Stimulation;Moist Heat;Therapeutic exercise;Therapeutic activities;Functional mobility training;Ultrasound;Neuromuscular re-education;Patient/family education;Manual techniques;Vasopneumatic Device;Taping;Splinting;Energy conservation;Dry needling;Passive range of motion;Scar mobilization    PT Next Visit Plan  progress AROM & strengthening    Consulted and Agree with Plan of Care  Patient       Patient will benefit from skilled  therapeutic intervention in order to improve the following deficits and impairments:  Hypomobility, Increased edema, Decreased scar mobility, Decreased activity tolerance, Decreased strength, Impaired UE functional use, Pain, Decreased range of motion, Improper body mechanics, Postural dysfunction, Impaired flexibility  Visit Diagnosis: Acute pain of left shoulder  Stiffness of left shoulder, not elsewhere classified  Muscle weakness (generalized)     Problem List Patient Active Problem List   Diagnosis Date Noted  . Lumbar stenosis with neurogenic claudication 06/18/2019  . Traumatic complete tear of left rotator cuff 05/04/2019  . Lumbar facet arthropathy 10/30/2017  . Osteoarthritis of right hip 09/09/2015  . Status post total replacement of right hip 09/09/2015  . Chest pain 02/05/2014  . Palpitations 02/05/2014  . Dyspnea 02/05/2014  . Dehydration 02/05/2014     Janene Harvey, PT, DPT 07/16/19 11:58 AM   [facility] [address]  [place], [place], [address] Phone: [phone]   Fax:  [phone]  Name: Matthew [name] MRN: [id number] Date of Birth: 1956/07/05

## 2019-07-22 ENCOUNTER — Encounter: Payer: Self-pay | Admitting: Physical Therapy

## 2019-07-22 ENCOUNTER — Ambulatory Visit: Payer: Medicare Other | Admitting: Physical Therapy

## 2019-07-22 ENCOUNTER — Other Ambulatory Visit: Payer: Self-pay

## 2019-07-22 DIAGNOSIS — M25612 Stiffness of left shoulder, not elsewhere classified: Secondary | ICD-10-CM

## 2019-07-22 DIAGNOSIS — M25512 Pain in left shoulder: Secondary | ICD-10-CM | POA: Diagnosis not present

## 2019-07-22 DIAGNOSIS — M6281 Muscle weakness (generalized): Secondary | ICD-10-CM

## 2019-07-22 NOTE — Therapy (Signed)
[facility] [address]  [place], [place], [address] Phone: [phone]   Fax:  [phone]  Physical Therapy Treatment  Patient Details  Name: Matthew [name] MRN: [id number] Date of Birth: 1956/05/27 Referring Provider (PT): Jean Rosenthal, MD   Encounter Date: 07/22/2019  PT End of Session - 07/22/19 0933    Visit Number  12    Number of Visits  16    Date for PT Re-Evaluation  08/24/19    Authorization Type  [employer] Medicare    PT Start Time  0845    PT Stop Time  0941    PT Time Calculation (min)  56 min    Activity Tolerance  Patient tolerated treatment well    Behavior During Therapy  [facility] for tasks assessed/performed       Past Medical History:  Diagnosis Date  . Anxiety   . Arthritis    KNEES BACK SHOULDERS  . Cancer (HCC)    HX SKIN CANCER  . Heart murmur    "nothing to be concerned with, I ve had it all my life."  . Hx of nonmelanoma skin cancer   . Hypertension    FOLLOWED BY DR ED GREEN    Past Surgical History:  Procedure Laterality Date  . ANTERIOR LAT LUMBAR FUSION Left 06/18/2019   Procedure: Left Lumbar three-four Anterolateral lumbar decompression/interbody fusion with lateral plate fixation;  Surgeon: Kristeen Miss, MD;  Location: [facility];  Service: Neurosurgery;  Laterality: Left;  . BACK SURGERY  2010   L5S1 FUSION  . COLONOSCOPY  05/13/2019  . KNEE ARTHROPLASTY  2010   L  . KNEE ARTHROSCOPY     L X 2  . REPLACEMENT TOTAL KNEE Left 11/18/2008  . SHOULDER ARTHROSCOPY Left 05/14/2019  . TOTAL HIP ARTHROPLASTY Right 09/09/2015   Procedure: RIGHT TOTAL HIP ARTHROPLASTY ANTERIOR APPROACH;  Surgeon: Mcarthur Rossetti, MD;  Location: [facility];  Service: Orthopedics;  Laterality: Right;  . TOTAL KNEE ARTHROPLASTY  10/08/2011   Procedure: rightTOTAL KNEE ARTHROPLASTY;  Surgeon: Rudean Haskell, MD;  Location: [facility];  Service: Orthopedics;  Laterality: Right;  TOTAL RIGHT KNEE  ARTHROPLASTY     There were no vitals filed for this visit.  Subjective Assessment - 07/22/19 0849    Subjective  Should has been feeling pretty good. Has some tightness in B UT and the R shoulder has been more inflamed lately.    Pertinent History  HTN, hx skin CA, anxiety, TKA 2012, R THA 2016, L TKA 2010, L5-S1 fusion 2010    Diagnostic tests  03/26/19 L shoulder MRI: Large full-thickness retracted tear of the supraspinatus tendon; Probable avulsion of the long head of the biceps tendon with distal retraction    Patient Stated Goals  resume golf    Currently in Pain?  Yes    Pain Score  1     Pain Location  Shoulder    Pain Orientation  Right    Pain Descriptors / Indicators  Aching    Pain Type  Chronic pain                       [facility] Adult PT Treatment/Exercise - 07/22/19 0001      Shoulder Exercises: Seated   Flexion  Strengthening;Left;10 reps;Weights    Flexion Weight (lbs)  1    Flexion Limitations  to ~90 deg d/t shoulder hike    Abduction  Strengthening;Left;10 reps;Weights  ABduction Weight (lbs)  1    ABduction Limitations  within limited ROM d/t shoulder hiking      Shoulder Exercises: Prone   Flexion  Strengthening;Left;10 reps    Flexion Limitations  prone I over green pball 2x10   neutral spine to protect LB   Horizontal ABduction 1  Strengthening;Left;10 reps    Horizontal ABduction 1 Limitations  prone T over green pball 2x10   neutral spine to protect LB   Other Prone Exercises  L UE prone Y over green pball 2x10   neutral spine to protect LB     Shoulder Exercises: Standing   Other Standing Exercises  shallow wall push up plus x10   discontinued d/t LBP     Shoulder Exercises: ROM/Strengthening   UBE (Upper Arm Bike)  L1.5 x 62mn forward/3 min back      Shoulder Exercises: Stretch   Corner Stretch  2 reps;30 seconds    Corner Stretch Limitations  mid pec stretch to tolerance   required modification to avoid pain      Vasopneumatic   Number Minutes Vasopneumatic   10 minutes    Vasopnuematic Location   Shoulder   L   Vasopneumatic Pressure  Low    Vasopneumatic Temperature   coldest      Manual Therapy   Manual Therapy  Soft tissue mobilization;Myofascial release    Manual therapy comments  sitting    Soft tissue mobilization  STM and IASTM to B UT, LS, cervical paraspinals, scalenes- most tenderness and soft tissue restriction on L    Myofascial Release  manual TPR to B UT & LS      Neck Exercises: Stretches   Upper Trapezius Stretch  Left;30 seconds;Right;2 reps   sitting on hands   Levator Stretch  Right;Left;2 reps;30 seconds   sitting on hands              PT Short Term Goals - 07/13/19 0854      PT SHORT TERM GOAL #1   Title  Patient to be independent with initial HEP.    Time  4    Period  Weeks    Status  Achieved    Target Date  06/22/19        PT Long Term Goals - 07/13/19 0854      PT LONG TERM GOAL #1   Title  Patient to be independent with advanced HEP.    Time  6    Period  Weeks    Status  Partially Met   met for current   Target Date  08/24/19      PT LONG TERM GOAL #2   Title  Patient to demonstrate [facility] without pain [facility].    Time  6    Period  Weeks    Status  Partially Met   improvement in all planes of L shoulder PROM, and able to tolerate measurement for AROM in all planes   Target Date  08/24/19      PT LONG TERM GOAL #3   Title  Patient to demonstrate L shoulder strength >=4+/5.    Time  6    Period  Weeks    Status  On-going   able to tolerate strength testing, but still showing weakness in all muscle groups   Target Date  08/24/19      PT LONG TERM GOAL #4   Title  Patient to demonstrate L shoulder elevation to overhead shelf  with 5lbs with good scapular mechanics and no evidence of compensations.    Time  6    Period  Weeks    Status  Partially Met   able to perform overhead elevation to shelf without  weight and with mild shoulder hiking   Target Date  08/24/19      PT LONG TERM GOAL #5   Title  Patient to demonstrate lifting 10lb box from floor with good body mechanics and no pain.    Time  6    Period  Weeks    Status  Deferred   NT d/t recent lumbar fusion   Target Date  08/24/19            Plan - 07/22/19 1026    Clinical Impression Statement  Patient reporting increased UT and R shoulder pain today. Received STM, IASTM, and manual TPR to B UT, LS, cervical paraspinals, and scalenes, with patient demonstrating increased soft tissue restriction on L. Followed through with gentle cervical stretching, after which patient reported relief. Introduced prone periscapular strengthening with patient demonstrating limited ROM and requiring cues for proper alignment. Had patient maintain neutral spine to protect LB s/p fusion. Patient limited by back pain with shallow wall push up plus, thus it was discontinued. Patient still requiring cues to avoid L shoulder hiking with flexion, scaption, and abduction with light weight. Ended session with [place] to L shoulder for post-exercise soreness. No further complaints at end of session.    Comorbidities  HTN, hx skin CA, anxiety, TKA 2012, R THA 2016, L TKA 2010, L5-S1 fusion 2010    PT Frequency  1x / week    PT Duration  6 weeks    PT Treatment/Interventions  ADLs/Self Care Home Management;Cryotherapy;Electrical Stimulation;Moist Heat;Therapeutic exercise;Therapeutic activities;Functional mobility training;Ultrasound;Neuromuscular re-education;Patient/family education;Manual techniques;Vasopneumatic Device;Taping;Splinting;Energy conservation;Dry needling;Passive range of motion;Scar mobilization    PT Next Visit Plan  progress AROM & strengthening    Consulted and Agree with Plan of Care  Patient       Patient will benefit from skilled therapeutic intervention in order to improve the following deficits and impairments:  Hypomobility,  Increased edema, Decreased scar mobility, Decreased activity tolerance, Decreased strength, Impaired UE functional use, Pain, Decreased range of motion, Improper body mechanics, Postural dysfunction, Impaired flexibility  Visit Diagnosis: Acute pain of left shoulder  Stiffness of left shoulder, not elsewhere classified  Muscle weakness (generalized)     Problem List Patient Active Problem List   Diagnosis Date Noted  . Lumbar stenosis with neurogenic claudication 06/18/2019  . Traumatic complete tear of left rotator cuff 05/04/2019  . Lumbar facet arthropathy 10/30/2017  . Osteoarthritis of right hip 09/09/2015  . Status post total replacement of right hip 09/09/2015  . Chest pain 02/05/2014  . Palpitations 02/05/2014  . Dyspnea 02/05/2014  . Dehydration 02/05/2014     Janene Harvey, PT, DPT 07/22/19 10:28 AM   [facility] [place], [place], [address] Phone: [phone]   Fax:  [phone]  Name: LEWAYNE PAULEY MRN: [id number] Date of Birth: 1956/05/19

## 2019-07-29 ENCOUNTER — Ambulatory Visit: Payer: Medicare Other

## 2019-07-29 ENCOUNTER — Other Ambulatory Visit: Payer: Self-pay

## 2019-07-29 DIAGNOSIS — M25512 Pain in left shoulder: Secondary | ICD-10-CM

## 2019-07-29 DIAGNOSIS — M25612 Stiffness of left shoulder, not elsewhere classified: Secondary | ICD-10-CM

## 2019-07-29 DIAGNOSIS — M6281 Muscle weakness (generalized): Secondary | ICD-10-CM

## 2019-07-29 NOTE — Therapy (Signed)
[facility] [address]  [place], [place], [address] Phone: [phone]   Fax:  [phone]  Physical Therapy Treatment  Patient Details  Name: Matthew [name] MRN: [id number] Date of Birth: [birth year] Referring Provider (PT): Matthew Rosenthal, MD   Encounter Date: 07/29/2019  PT End of Session - 07/29/19 0937    Visit Number  13    Number of Visits  16    Date for PT Re-Evaluation  08/24/19    Authorization Type  [employer] Medicare    PT Start Time  0930    PT Stop Time  1020    PT Time Calculation (min)  50 min    Activity Tolerance  Patient tolerated treatment well    Behavior During Therapy  [facility] for tasks assessed/performed       Past Medical History:  Diagnosis Date  . Anxiety   . Arthritis    KNEES BACK SHOULDERS  . Cancer (HCC)    HX SKIN CANCER  . Heart murmur    "nothing to be concerned with, I ve had it all my life."  . Hx of nonmelanoma skin cancer   . Hypertension    FOLLOWED BY DR ED GREEN    Past Surgical History:  Procedure Laterality Date  . ANTERIOR LAT LUMBAR FUSION Left 06/18/2019   Procedure: Left Lumbar three-four Anterolateral lumbar decompression/interbody fusion with lateral plate fixation;  Surgeon: Kristeen Miss, MD;  Location: [facility];  Service: Neurosurgery;  Laterality: Left;  . BACK SURGERY  2010   L5S1 FUSION  . COLONOSCOPY  05/13/2019  . KNEE ARTHROPLASTY  2010   L  . KNEE ARTHROSCOPY     L X 2  . REPLACEMENT TOTAL KNEE Left 11/18/2008  . SHOULDER ARTHROSCOPY Left 05/14/2019  . TOTAL HIP ARTHROPLASTY Right 09/09/2015   Procedure: RIGHT TOTAL HIP ARTHROPLASTY ANTERIOR APPROACH;  Surgeon: Mcarthur Rossetti, MD;  Location: [facility];  Service: Orthopedics;  Laterality: Right;  . TOTAL KNEE ARTHROPLASTY  10/08/2011   Procedure: rightTOTAL KNEE ARTHROPLASTY;  Surgeon: Rudean Haskell, MD;  Location: [facility];  Service: Orthopedics;  Laterality: Right;  TOTAL RIGHT KNEE  ARTHROPLASTY     There were no vitals filed for this visit.  Subjective Assessment - 07/29/19 0936    Subjective  Pt. doing well today.  Notes he has been able to lift 15# box with elbows by side without pain.    Pertinent History  HTN, hx skin CA, anxiety, TKA 2012, R THA 2016, L TKA 2010, L5-S1 fusion 2010    Diagnostic tests  03/26/19 L shoulder MRI: Large full-thickness retracted tear of the supraspinatus tendon; Probable avulsion of the long head of the biceps tendon with distal retraction    Patient Stated Goals  resume golf    Currently in Pain?  No/denies    Pain Score  0-No pain    Multiple Pain Sites  No                       [facility] Adult PT Treatment/Exercise - 07/29/19 0001      Shoulder Exercises: Supine   Protraction  20 reps    Protraction Weight (lbs)  3    Other Supine Exercises  L shoulder ABCs x 1 round     Other Supine Exercises  L shoulder rhythmic stabilization with eyes closed and sustained L shoulder flexion at 90 dg + therapist perturbations x 30 sec  Shoulder Exercises: Standing   External Rotation  Strengthening;Left;10 reps    Theraband Level (Shoulder External Rotation)  Level 2 (Red)    External Rotation Limitations  with towel roll under elbow; cues to stop at neutral    Internal Rotation  Strengthening;Left;10 reps    Theraband Level (Shoulder Internal Rotation)  Level 2 (Red)    Internal Rotation Limitations  with towel roll under elbow; cues to stop at neutral    Extension  Both;15 reps;Theraband;Strengthening    Theraband Level (Shoulder Extension)  Level 1 (Yellow)    Extension Limitations  + scap. retraction     Row  Strengthening;Both;Theraband;20 reps    Theraband Level (Shoulder Row)  Level 1 (Yellow)    Other Standing Exercises  L shoulder 1# flexion to 2nd shelf above counter x 10 reps       Shoulder Exercises: ROM/Strengthening   UBE (Upper Arm Bike)  L2.0 x 38mn forward/3 min back    Cybex Row  15 reps    Cybex  Row Limitations  15#       Shoulder Exercises: SIT sales professional 2 reps;30 seconds    Corner Stretch Limitations  low pec stretch to tolerance    Internal Rotation Stretch Limitations  5" x 10 reps with wand behind back     Other Shoulder Stretches  posterior/inferior shoulder stretch cross body x 30 sec       Vasopneumatic   Number Minutes Vasopneumatic   10 minutes    Vasopnuematic Location   Shoulder   L   Vasopneumatic Pressure  Low    Vasopneumatic Temperature   coldest               PT Short Term Goals - 07/13/19 09373     PT SHORT TERM GOAL #1   Title  Patient to be independent with initial HEP.    Time  4    Period  Weeks    Status  Achieved    Target Date  06/22/19        PT Long Term Goals - 07/13/19 0854      PT LONG TERM GOAL #1   Title  Patient to be independent with advanced HEP.    Time  6    Period  Weeks    Status  Partially Met   met for current   Target Date  08/24/19      PT LONG TERM GOAL #2   Title  Patient to demonstrate [facility] without pain limiting.    Time  6    Period  Weeks    Status  Partially Met   improvement in all planes of L shoulder PROM, and able to tolerate measurement for AROM in all planes   Target Date  08/24/19      PT LONG TERM GOAL #3   Title  Patient to demonstrate L shoulder strength >=4+/5.    Time  6    Period  Weeks    Status  On-going   able to tolerate strength testing, but still showing weakness in all muscle groups   Target Date  08/24/19      PT LONG TERM GOAL #4   Title  Patient to demonstrate L shoulder elevation to overhead shelf with 5lbs with good scapular mechanics and no evidence of compensations.    Time  6    Period  Weeks    Status  Partially Met   able  to perform overhead elevation to shelf without weight and with mild shoulder hiking   Target Date  08/24/19      PT LONG TERM GOAL #5   Title  Patient to demonstrate lifting 10lb box from floor with good  body mechanics and no pain.    Time  6    Period  Weeks    Status  Deferred   NT d/t recent lumbar fusion   Target Date  08/24/19            Plan - 07/29/19 0945    Clinical Impression Statement  [name] doing well today with no new complaints.  Was able to progress elevation activities to 1# flexion x 10 reps to 2nd shelf over counter without pain.  Able to progress to machine row and supine rhythmic stabilization activities without issue today.  does require some cueing at times for proper pacing with exercises.  Did exhibit some limitation into abduction/scaption motion today which limited gentle Lat pulldown machine strengthening activities.  Addressed posterior/inferior shoulder tightness and anterior capsular tightness which are likely contributing to limited overhead and behind back IR motions.  Ended visit with pt. requesting ice/compression to reduce post-exercise soreness.    Personal Factors and Comorbidities  Age;Behavior Pattern;Time since onset of injury/illness/exacerbation;Comorbidity 3+;Past/Current Experience    Comorbidities  HTN, hx skin CA, anxiety, TKA 2012, R THA 2016, L TKA 2010, L5-S1 fusion 2010    Rehab Potential  Good    PT Treatment/Interventions  ADLs/Self Care Home Management;Cryotherapy;Electrical Stimulation;Moist Heat;Therapeutic exercise;Therapeutic activities;Functional mobility training;Ultrasound;Neuromuscular re-education;Patient/family education;Manual techniques;Vasopneumatic Device;Taping;Splinting;Energy conservation;Dry needling;Passive range of motion;Scar mobilization    PT Next Visit Plan  progress AROM & strengthening    Consulted and Agree with Plan of Care  Patient       Patient will benefit from skilled therapeutic intervention in order to improve the following deficits and impairments:  Hypomobility, Increased edema, Decreased scar mobility, Decreased activity tolerance, Decreased strength, Impaired UE functional use, Pain, Decreased range of  motion, Improper body mechanics, Postural dysfunction, Impaired flexibility  Visit Diagnosis: Acute pain of left shoulder  Stiffness of left shoulder, not elsewhere classified  Muscle weakness (generalized)     Problem List Patient Active Problem List   Diagnosis Date Noted  . Lumbar stenosis with neurogenic claudication 06/18/2019  . Traumatic complete tear of left rotator cuff 05/04/2019  . Lumbar facet arthropathy 10/30/2017  . Osteoarthritis of right hip 09/09/2015  . Status post total replacement of right hip 09/09/2015  . Chest pain 02/05/2014  . Palpitations 02/05/2014  . Dyspnea 02/05/2014  . Dehydration 02/05/2014    Bess Harvest, PTA 07/29/19 12:31 PM   [facility] [address]  [place], [place], [address] Phone: [phone]   Fax:  [phone]  Name: Matthew [name] MRN: [id number] Date of Birth: 1956/02/03

## 2019-08-04 ENCOUNTER — Ambulatory Visit (INDEPENDENT_AMBULATORY_CARE_PROVIDER_SITE_OTHER): Payer: Medicare Other | Admitting: Orthopaedic Surgery

## 2019-08-04 ENCOUNTER — Encounter: Payer: Self-pay | Admitting: Orthopaedic Surgery

## 2019-08-04 DIAGNOSIS — R2232 Localized swelling, mass and lump, left upper limb: Secondary | ICD-10-CM

## 2019-08-04 NOTE — Progress Notes (Signed)
HPI Matthew [name] comes in today for forearm mass is been there for several years.  He states that is been slowly growing.  In his becoming painful to the point that he would like it removed.  He feels that this is causing some nerve pain also.  However he cannot fully describe where he has some numbness tingling.  He then states that it may actually be coming from the way sleeping and sleeping on his hand.  He has had no recent fevers or chills.  Had a 60 pound weight loss with this is due to diet is gained 10 pounds back.  Review of systems: Please see HPI otherwise negative or noncontributory.  Physical exam:  General: Well-developed well-nourished male no acute distress mood and affect appropriate.    Psych: Alert and oriented x3  Left forearm: Mobile mass the ulnar volar to medial aspect with the distal diaphysis region.  Mass is mobile.  Minimal discomfort with manipulation.  Mass measures 4 x 4 cm.  There is no abnormal warmth erythema.  Left hand full sensation throughout.  Full motor of the left hand.  Full supination pronation forearm.  Impression: Left forearm painful mass  Plan: We will obtain an MRI of his left forearm with contrast to evaluate for mass.  Have him return after the MRI to go over the results.  Discussed with him this most likely represents a benign mass we will definitely send it off for pathology at the time of surgery.  Most likely will proceed with excision of the mass in the near future.  Questions encouraged and answered at length.

## 2019-08-05 ENCOUNTER — Other Ambulatory Visit: Payer: Self-pay

## 2019-08-05 ENCOUNTER — Encounter: Payer: Medicare Other | Admitting: Physical Therapy

## 2019-08-05 DIAGNOSIS — R2232 Localized swelling, mass and lump, left upper limb: Secondary | ICD-10-CM

## 2019-08-06 ENCOUNTER — Other Ambulatory Visit: Payer: Self-pay

## 2019-08-06 ENCOUNTER — Ambulatory Visit: Payer: Medicare Other

## 2019-08-06 DIAGNOSIS — M25612 Stiffness of left shoulder, not elsewhere classified: Secondary | ICD-10-CM

## 2019-08-06 DIAGNOSIS — M6281 Muscle weakness (generalized): Secondary | ICD-10-CM

## 2019-08-06 DIAGNOSIS — M25512 Pain in left shoulder: Secondary | ICD-10-CM | POA: Diagnosis not present

## 2019-08-06 NOTE — Therapy (Signed)
[facility] [address]  [place], [place], [address] Phone: [phone]   Fax:  [phone]  Physical Therapy Treatment  Patient Details  Name: Matthew [name] MRN: [id number] Date of Birth: December 02, 1955 Referring Provider (PT): Jean Rosenthal, MD   Encounter Date: 08/06/2019  PT End of Session - 08/06/19 0851    Visit Number  14    Number of Visits  16    Date for PT Re-Evaluation  08/24/19    Authorization Type  [employer] Medicare    PT Start Time  0845    PT Stop Time  0940    PT Time Calculation (min)  55 min    Activity Tolerance  Patient tolerated treatment well    Behavior During Therapy  [facility] for tasks assessed/performed       Past Medical History:  Diagnosis Date  . Anxiety   . Arthritis    KNEES BACK SHOULDERS  . Cancer (HCC)    HX SKIN CANCER  . Heart murmur    "nothing to be concerned with, I ve had it all my life."  . Hx of nonmelanoma skin cancer   . Hypertension    FOLLOWED BY DR ED GREEN    Past Surgical History:  Procedure Laterality Date  . ANTERIOR LAT LUMBAR FUSION Left 06/18/2019   Procedure: Left Lumbar three-four Anterolateral lumbar decompression/interbody fusion with lateral plate fixation;  Surgeon: Kristeen Miss, MD;  Location: [facility];  Service: Neurosurgery;  Laterality: Left;  . BACK SURGERY  2010   L5S1 FUSION  . COLONOSCOPY  05/13/2019  . KNEE ARTHROPLASTY  2010   L  . KNEE ARTHROSCOPY     L X 2  . REPLACEMENT TOTAL KNEE Left 11/18/2008  . SHOULDER ARTHROSCOPY Left 05/14/2019  . TOTAL HIP ARTHROPLASTY Right 09/09/2015   Procedure: RIGHT TOTAL HIP ARTHROPLASTY ANTERIOR APPROACH;  Surgeon: Mcarthur Rossetti, MD;  Location[facility];  Service: Orthopedics;  Laterality: Right;  . TOTAL KNEE ARTHROPLASTY  10/08/2011   Procedure: rightTOTAL KNEE ARTHROPLASTY;  Surgeon: Rudean Haskell, MD;  Location: [facility];  Service: Orthopedics;  Laterality: Right;  TOTAL RIGHT KNEE  ARTHROPLASTY     There were no vitals filed for this visit.  Subjective Assessment - 08/06/19 0848    Subjective  Pt. reporting increased L shoulder soreness and back and R shoulder soreness over last few days unsure if change in weather has anything to do with it.    Pertinent History  HTN, hx skin CA, anxiety, TKA 2012, R THA 2016, L TKA 2010, L5-S1 fusion 2010    Diagnostic tests  03/26/19 L shoulder MRI: Large full-thickness retracted tear of the supraspinatus tendon; Probable avulsion of the long head of the biceps tendon with distal retraction    Patient Stated Goals  resume golf    Currently in Pain?  Yes    Pain Score  3     Pain Location  Shoulder    Pain Orientation  Right    Pain Descriptors / Indicators  Aching    Pain Type  Chronic pain    Pain Onset  More than a month ago    Pain Frequency  Intermittent    Aggravating Factors   prolonged overhead reaching    Pain Relieving Factors  rest    Multiple Pain Sites  No  [facility] Adult PT Treatment/Exercise - 08/06/19 0001      Shoulder Exercises: Standing   Other Standing Exercises  L shoulder 2# flexion, 1# scaption to 2nd shelf above counter x 10 reps       Shoulder Exercises: ROM/Strengthening   UBE (Upper Arm Bike)  L2.0 x 58mn forward/3 min back    Lat Pull  10 reps    Lat Pull Limitations  15# - narrow grip - reported L shoulder "stretch" at top of motion     Cybex Row  15 reps    Cybex Row Limitations  15#     Wall Pushups  10 reps    Wall Pushups Limitations  + serratus punch       Shoulder Exercises: Body Blade   Flexion Limitations  B UE x 10 sec moving in 0-90 dg arc   difficulty coordinating movement    External Rotation  15 seconds;1 rep    External Rotation Limitations  elbow by side    Internal Rotation  1 rep;15 seconds    Internal Rotation Limitations  elbow by side      Modalities   Modalities  Moist Heat      Moist Heat Therapy   Number Minutes Moist Heat   10 Minutes    Moist Heat Location  Lumbar Spine   due to complaint of back stiffness     Vasopneumatic   Number Minutes Vasopneumatic   10 minutes    Vasopnuematic Location   Shoulder    Vasopneumatic Pressure  Low    Vasopneumatic Temperature   coldest      Manual Therapy   Manual Therapy  Soft tissue mobilization;Myofascial release    Manual therapy comments  supine    Soft tissue mobilization  STM to L posterior/inferior shoulder, anterior deltoid pec in area of tenderness                PT Short Term Goals - 07/13/19 0854      PT SHORT TERM GOAL #1   Title  Patient to be independent with initial HEP.    Time  4    Period  Weeks    Status  Achieved    Target Date  06/22/19        PT Long Term Goals - 07/13/19 0854      PT LONG TERM GOAL #1   Title  Patient to be independent with advanced HEP.    Time  6    Period  Weeks    Status  Partially Met   met for current   Target Date  08/24/19      PT LONG TERM GOAL #2   Title  Patient to demonstrate [facility] without pain limiting.    Time  6    Period  Weeks    Status  Partially Met   improvement in all planes of L shoulder PROM, and able to tolerate measurement for AROM in all planes   Target Date  08/24/19      PT LONG TERM GOAL #3   Title  Patient to demonstrate L shoulder strength >=4+/5.    Time  6    Period  Weeks    Status  On-going   able to tolerate strength testing, but still showing weakness in all muscle groups   Target Date  08/24/19      PT LONG TERM GOAL #4   Title  Patient to demonstrate L shoulder elevation to  overhead shelf with 5lbs with good scapular mechanics and no evidence of compensations.    Time  6    Period  Weeks    Status  Partially Met   able to perform overhead elevation to shelf without weight and with mild shoulder hiking   Target Date  08/24/19      PT LONG TERM GOAL #5   Title  Patient to demonstrate lifting 10lb box from floor with good body  mechanics and no pain.    Time  6    Period  Weeks    Status  Deferred   NT d/t recent lumbar fusion   Target Date  08/24/19            Plan - 08/06/19 0851    Clinical Impression Statement  Pt. reporting some L anterior shoulder soreness however admits to "chipping" golf a few days ago which he admits may have increased his shoulder soreness.  Tolerated all L shoulder/scapular strengthening activities well today including greater focus on rhythmic stabilization.  Pt. quickly fatigues with overhead strengthening activities however progressed with flexion, scaption today.  Pt. progressing well per protocol.    Personal Factors and Comorbidities  Age;Behavior Pattern;Time since onset of injury/illness/exacerbation;Comorbidity 3+;Past/Current Experience    Comorbidities  HTN, hx skin CA, anxiety, TKA 2012, R THA 2016, L TKA 2010, L5-S1 fusion 2010    Rehab Potential  Good    PT Treatment/Interventions  ADLs/Self Care Home Management;Cryotherapy;Electrical Stimulation;Moist Heat;Therapeutic exercise;Therapeutic activities;Functional mobility training;Ultrasound;Neuromuscular re-education;Patient/family education;Manual techniques;Vasopneumatic Device;Taping;Splinting;Energy conservation;Dry needling;Passive range of motion;Scar mobilization    PT Next Visit Plan  progress AROM & strengthening    Consulted and Agree with Plan of Care  Patient       Patient will benefit from skilled therapeutic intervention in order to improve the following deficits and impairments:  Hypomobility, Increased edema, Decreased scar mobility, Decreased activity tolerance, Decreased strength, Impaired UE functional use, Pain, Decreased range of motion, Improper body mechanics, Postural dysfunction, Impaired flexibility  Visit Diagnosis: Acute pain of left shoulder  Stiffness of left shoulder, not elsewhere classified  Muscle weakness (generalized)     Problem List Patient Active Problem List   Diagnosis  Date Noted  . Lumbar stenosis with neurogenic claudication 06/18/2019  . Traumatic complete tear of left rotator cuff 05/04/2019  . Lumbar facet arthropathy 10/30/2017  . Osteoarthritis of right hip 09/09/2015  . Status post total replacement of right hip 09/09/2015  . Chest pain 02/05/2014  . Palpitations 02/05/2014  . Dyspnea 02/05/2014  . Dehydration 02/05/2014    Bess Harvest, PTA 08/06/19 1:13 PM   [facility] [address]  [place], [place], [address] Phone: [phone]   Fax:  [phone]  Name: TAIVON HAROON MRN: [id number] Date of Birth: Oct 16, 1956

## 2019-08-12 ENCOUNTER — Other Ambulatory Visit: Payer: Self-pay

## 2019-08-12 ENCOUNTER — Encounter: Payer: Self-pay | Admitting: Physical Therapy

## 2019-08-12 ENCOUNTER — Ambulatory Visit: Payer: Medicare Other | Admitting: Physical Therapy

## 2019-08-12 DIAGNOSIS — M25512 Pain in left shoulder: Secondary | ICD-10-CM | POA: Diagnosis not present

## 2019-08-12 DIAGNOSIS — M25612 Stiffness of left shoulder, not elsewhere classified: Secondary | ICD-10-CM

## 2019-08-12 DIAGNOSIS — M6281 Muscle weakness (generalized): Secondary | ICD-10-CM

## 2019-08-12 NOTE — Therapy (Signed)
[facility] [address]  [place], [place], [address] Phone: [phone]   Fax:  [phone]  Physical Therapy Treatment  Patient Details  Name: Matthew [name] MRN: [id number] Date of Birth: [birth year] Referring Provider (PT): Jean Rosenthal, MD   Encounter Date: 08/12/2019  PT End of Session - 08/12/19 0933    Visit Number  15    Number of Visits  16    Date for PT Re-Evaluation  08/24/19    Authorization Type  [employer] Medicare    PT Start Time  0845    PT Stop Time  0928    PT Time Calculation (min)  43 min    Activity Tolerance  Patient tolerated treatment well;Patient limited by pain    Behavior During Therapy  [facility] for tasks assessed/performed       Past Medical History:  Diagnosis Date  . Anxiety   . Arthritis    KNEES BACK SHOULDERS  . Cancer (HCC)    HX SKIN CANCER  . Heart murmur    "nothing to be concerned with, I ve had it all my life."  . Hx of nonmelanoma skin cancer   . Hypertension    FOLLOWED BY DR ED GREEN    Past Surgical History:  Procedure Laterality Date  . ANTERIOR LAT LUMBAR FUSION Left 06/18/2019   Procedure: Left Lumbar three-four Anterolateral lumbar decompression/interbody fusion with lateral plate fixation;  Surgeon: Kristeen Miss, MD;  Location: [place];  Service: Neurosurgery;  Laterality: Left;  . BACK SURGERY  2010   L5S1 FUSION  . COLONOSCOPY  05/13/2019  . KNEE ARTHROPLASTY  2010   L  . KNEE ARTHROSCOPY     L X 2  . REPLACEMENT TOTAL KNEE Left 11/18/2008  . SHOULDER ARTHROSCOPY Left 05/14/2019  . TOTAL HIP ARTHROPLASTY Right 09/09/2015   Procedure: RIGHT TOTAL HIP ARTHROPLASTY ANTERIOR APPROACH;  Surgeon: Mcarthur Rossetti, MD;  Location: [facility];  Service: Orthopedics;  Laterality: Right;  . TOTAL KNEE ARTHROPLASTY  10/08/2011   Procedure: rightTOTAL KNEE ARTHROPLASTY;  Surgeon: Rudean Haskell, MD;  Location: [place];  Service: Orthopedics;  Laterality: Right;   TOTAL RIGHT KNEE ARTHROPLASTY     There were no vitals filed for this visit.  Subjective Assessment - 08/12/19 0847    Subjective  Has been feeling better since he stopped sleeping on his L side. Has times when he has more/less pain in the shoulder.    Pertinent History  HTN, hx skin CA, anxiety, TKA 2012, R THA 2016, L TKA 2010, L5-S1 fusion 2010    Diagnostic tests  03/26/19 L shoulder MRI: Large full-thickness retracted tear of the supraspinatus tendon; Probable avulsion of the long head of the biceps tendon with distal retraction    Patient Stated Goals  resume golf    Currently in Pain?  No/denies                       [facility] Adult PT Treatment/Exercise - 08/12/19 0001      Shoulder Exercises: Prone   Flexion  Strengthening;Left;10 reps    Flexion Limitations  prone I over green pball x10   c/o nonpainful pop   Horizontal ABduction 1  Strengthening;Left;10 reps    Horizontal ABduction 1 Limitations  prone T over green pball x10    Other Prone Exercises  L UE prone Y over green pball x10      Shoulder Exercises: Standing  External Rotation  Strengthening;Left;15 reps    Theraband Level (Shoulder External Rotation)  Level 3 (Green)    External Rotation Limitations  with towel roll under elbow; cues to stop at neutral    Internal Rotation  Strengthening;Left;15 reps    Theraband Level (Shoulder Internal Rotation)  Level 3 (Green)    Internal Rotation Limitations  with towel roll under elbow; cues to stop at neutral    [employer];Theraband;10 reps    Theraband Level (Shoulder Row)  Level 2 (Red)    Row Limitations  row + B ER   cues to maintain elbows at side   Other Standing Exercises  shoulder row with green TB x15   good form     Shoulder Exercises: ROM/Strengthening   UBE (Upper Arm Bike)  L2.0 x 81mn forward/3 min back      Shoulder Exercises: Stretch   Corner Stretch  2 reps;30 seconds    Corner Stretch Limitations  low pec stretch to  tolerance   correction of positioning to avoid pain   Other Shoulder Stretches  L shoulder IR/ER stretch with strap 5x5" to tolerance             PT Education - 08/12/19 0932    Education Details  update/review of HEP    Person(s) Educated  Patient    Methods  Explanation;Demonstration;Verbal cues;Tactile cues;Handout    Comprehension  Verbalized understanding;Returned demonstration       PT Short Term Goals - 07/13/19 0854      PT SHORT TERM GOAL #1   Title  Patient to be independent with initial HEP.    Time  4    Period  Weeks    Status  Achieved    Target Date  06/22/19        PT Long Term Goals - 07/13/19 0854      PT LONG TERM GOAL #1   Title  Patient to be independent with advanced HEP.    Time  6    Period  Weeks    Status  Partially Met   met for current   Target Date  08/24/19      PT LONG TERM GOAL #2   Title  Patient to demonstrate L shoulder AROM/[facility] without pain limiting.    Time  6    Period  Weeks    Status  Partially Met   improvement in all planes of L shoulder PROM, and able to tolerate measurement for AROM in all planes   Target Date  08/24/19      PT LONG TERM GOAL #3   Title  Patient to demonstrate L shoulder strength >=4+/5.    Time  6    Period  Weeks    Status  On-going   able to tolerate strength testing, but still showing weakness in all muscle groups   Target Date  08/24/19      PT LONG TERM GOAL #4   Title  Patient to demonstrate L shoulder elevation to overhead shelf with 5lbs with good scapular mechanics and no evidence of compensations.    Time  6    Period  Weeks    Status  Partially Met   able to perform overhead elevation to shelf without weight and with mild shoulder hiking   Target Date  08/24/19      PT LONG TERM GOAL #5   Title  Patient to demonstrate lifting 10lb box from floor with good body mechanics and  no pain.    Time  6    Period  Weeks    Status  Deferred   NT d/t recent lumbar fusion    Target Date  08/24/19            Plan - 08/12/19 0933    Clinical Impression Statement  Patient reporting improvement in L shoulder pain levels since avoiding sleeping on L side, as he believes this was flaring him up. Notes that he feels comfortable progressing himself at home, and notes that he will be ready to wrap up with PT next session. Agreeable to 30 day hold. Patient initially with L anterior shoulder pain with pec stretch, which dissipated after instruction to perform low pec stretch to avoid pain. Increased banded resistance with row and RTC strengthening with patient requiring cues to maintain neutral but with no complaints. Updated HEP to reflect this change in resistance- patient reported understanding.  Tolerated IR and ER stretch behind the back to tolerance, with report of good stretch. Declined modalities and with no complaints upon leaving session. Patient tolerating progression of exercises well. Plan for 30 day hold next session.    Personal Factors and Comorbidities  Age;Behavior Pattern;Time since onset of injury/illness/exacerbation;Comorbidity 3+;Past/Current Experience    Comorbidities  HTN, hx skin CA, anxiety, TKA 2012, R THA 2016, L TKA 2010, L5-S1 fusion 2010    Rehab Potential  Good    PT Treatment/Interventions  ADLs/Self Care Home Management;Cryotherapy;Electrical Stimulation;Moist Heat;Therapeutic exercise;Therapeutic activities;Functional mobility training;Ultrasound;Neuromuscular re-education;Patient/family education;Manual techniques;Vasopneumatic Device;Taping;Splinting;Energy conservation;Dry needling;Passive range of motion;Scar mobilization    PT Next Visit Plan  30 day hold    Consulted and Agree with Plan of Care  Patient       Patient will benefit from skilled therapeutic intervention in order to improve the following deficits and impairments:  Hypomobility, Increased edema, Decreased scar mobility, Decreased activity tolerance, Decreased strength,  Impaired UE functional use, Pain, Decreased range of motion, Improper body mechanics, Postural dysfunction, Impaired flexibility  Visit Diagnosis: Acute pain of left shoulder  Stiffness of left shoulder, not elsewhere classified  Muscle weakness (generalized)     Problem List Patient Active Problem List   Diagnosis Date Noted  . Lumbar stenosis with neurogenic claudication 06/18/2019  . Traumatic complete tear of left rotator cuff 05/04/2019  . Lumbar facet arthropathy 10/30/2017  . Osteoarthritis of right hip 09/09/2015  . Status post total replacement of right hip 09/09/2015  . Chest pain 02/05/2014  . Palpitations 02/05/2014  . Dyspnea 02/05/2014  . Dehydration 02/05/2014     Janene Harvey, PT, DPT 08/12/19 9:37 AM   [facility] [place], [place], [address] Phone: [phone]   Fax:  [phone]  Name: Matthew [name] MRN: [id number] Date of Birth: [birth year]

## 2019-08-18 ENCOUNTER — Encounter: Payer: Self-pay | Admitting: Physical Therapy

## 2019-08-18 ENCOUNTER — Ambulatory Visit: Payer: Medicare Other | Attending: Orthopaedic Surgery | Admitting: Physical Therapy

## 2019-08-18 ENCOUNTER — Other Ambulatory Visit: Payer: Self-pay

## 2019-08-18 DIAGNOSIS — M6281 Muscle weakness (generalized): Secondary | ICD-10-CM | POA: Diagnosis present

## 2019-08-18 DIAGNOSIS — M25612 Stiffness of left shoulder, not elsewhere classified: Secondary | ICD-10-CM | POA: Diagnosis present

## 2019-08-18 DIAGNOSIS — M25512 Pain in left shoulder: Secondary | ICD-10-CM | POA: Diagnosis not present

## 2019-08-18 NOTE — Therapy (Addendum)
[facility] [address]  [place], [place], [address] Phone: [phone]   Fax:  [phone]  Physical Therapy Treatment  Patient Details  Name: Matthew [name] MRN: [id number] Date of Birth: Feb 25, 1956 Referring Provider (PT): Jean Rosenthal, MD   Progress Note Reporting Period 07/16/19 to 08/18/19  See note below for Objective Data and Assessment of Progress/Goals.     Encounter Date: 08/18/2019  PT End of Session - 08/18/19 0906    Visit Number  16    Number of Visits  16    Date for PT Re-Evaluation  08/24/19    Authorization Type  [employer] Medicare    PT Start Time  0848    PT Stop Time  0930    PT Time Calculation (min)  42 min    Activity Tolerance  Patient tolerated treatment well;Patient limited by pain    Behavior During Therapy  [facility] for tasks assessed/performed       Past Medical History:  Diagnosis Date  . Anxiety   . Arthritis    KNEES BACK SHOULDERS  . Cancer (HCC)    HX SKIN CANCER  . Heart murmur    "nothing to be concerned with, I ve had it all my life."  . Hx of nonmelanoma skin cancer   . Hypertension    FOLLOWED BY DR ED GREEN    Past Surgical History:  Procedure Laterality Date  . ANTERIOR LAT LUMBAR FUSION Left 06/18/2019   Procedure: Left Lumbar three-four Anterolateral lumbar decompression/interbody fusion with lateral plate fixation;  Surgeon: Kristeen Miss, MD;  Location: [place];  Service: Neurosurgery;  Laterality: Left;  . BACK SURGERY  2010   L5S1 FUSION  . COLONOSCOPY  05/13/2019  . KNEE ARTHROPLASTY  2010   L  . KNEE ARTHROSCOPY     L X 2  . REPLACEMENT TOTAL KNEE Left 11/18/2008  . SHOULDER ARTHROSCOPY Left 05/14/2019  . TOTAL HIP ARTHROPLASTY Right 09/09/2015   Procedure: RIGHT TOTAL HIP ARTHROPLASTY ANTERIOR APPROACH;  Surgeon: Mcarthur Rossetti, MD;  Location: [facility];  Service: Orthopedics;  Laterality: Right;  . TOTAL KNEE ARTHROPLASTY  10/08/2011    Procedure: rightTOTAL KNEE ARTHROPLASTY;  Surgeon: Rudean Haskell, MD;  Location: [place];  Service: Orthopedics;  Laterality: Right;  TOTAL RIGHT KNEE ARTHROPLASTY     There were no vitals filed for this visit.  Subjective Assessment - 08/18/19 0905    Subjective  Has been feeling better since he stopped sleeping on his L side. Has times when he has more/less pain in the shoulder.    Pertinent History  HTN, hx skin CA, anxiety, TKA 2012, R THA 2016, L TKA 2010, L5-S1 fusion 2010    Diagnostic tests  03/26/19 L shoulder MRI: Large full-thickness retracted tear of the supraspinatus tendon; Probable avulsion of the long head of the biceps tendon with distal retraction    Patient Stated Goals  resume golf    Currently in Pain?  No/denies    Pain Location  Shoulder    Pain Orientation  Left    Pain Descriptors / Indicators  Aching    Pain Type  Chronic pain    Pain Onset  More than a month ago    Pain Frequency  Intermittent         [facility] PT Assessment - 08/18/19 0001      Assessment   Medical Diagnosis  s/p arthroscopy of L shoulder; RTC repair of full-thickness complete  tear of supraspinatus    Referring Provider (PT)  Jean Rosenthal, MD    Onset Date/Surgical Date  05/14/19      Observation/Other Assessments   Focus on Therapeutic Outcomes (FOTO)   Shoulder: 50 (30% limited, 24% predicted)      AROM   Right/Left Shoulder  Left    Left Shoulder Flexion  140 Degrees    Left Shoulder ABduction  92 Degrees    Left Shoulder Internal Rotation  --   thumb to L2   Left Shoulder External Rotation  50 Degrees      PROM   Left Shoulder Flexion  148 Degrees    Left Shoulder ABduction  135 Degrees    Left Shoulder Internal Rotation  75 Degrees   to stomach   Left Shoulder External Rotation  52 Degrees      Strength   Right Shoulder Flexion  4/5    Right Shoulder ABduction  4/5    Right Shoulder Internal Rotation  4/5    Right Shoulder External Rotation  4/5                    [facility] Adult PT Treatment/Exercise - 08/18/19 0001      Shoulder Exercises: Standing   External Rotation  Strengthening;Left;15 reps    Theraband Level (Shoulder External Rotation)  Level 3 (Green)    External Rotation Limitations  with towel roll under elbow; cues to stop at neutral    Internal Rotation  Strengthening;Left;15 reps    Theraband Level (Shoulder Internal Rotation)  Level 3 (Green)    Internal Rotation Limitations  with towel roll under elbow; cues to stop at neutral    [employer];Theraband;10 reps    Theraband Level (Shoulder Row)  Level 3 (Green)      Shoulder Exercises: ROM/Strengthening   UBE (Upper Arm Bike)  L2.0 x 18mn forward/3 min back      Shoulder Exercises: Stretch   Corner Stretch  2 reps;30 seconds    Corner Stretch Limitations  low pec stretch to tolerance   correction of positioning to avoid pain   Other Shoulder Stretches  attempted D1 diagnols with red theraband wtih limited ROM into flexion/abduction and ER.              PT Education - 08/18/19 1110    Education Details  reviewed HEP and added resisted diagnals    Person(s) Educated  Patient    Methods  Explanation;Demonstration;Verbal cues    Comprehension  Verbalized understanding;Returned demonstration       PT Short Term Goals - 08/18/19 0907      PT SHORT TERM GOAL #1   Title  Patient to be independent with initial HEP.    Time  4    Period  Weeks    Status  Achieved        PT Long Term Goals - 08/18/19 07893     PT LONG TERM GOAL #1   Title  Patient to be independent with advanced HEP.    Time  6    Period  Weeks    Status  Achieved      PT LONG TERM GOAL #2   Title  Patient to demonstrate L shoulder AROM/PROM WFL and without pain limiting.    Baseline  Pt able to reach in cabinet to get dishes without pain.    Time  6    Period  Weeks    Status  Partially Met  PT LONG TERM GOAL #3   Title  Patient to demonstrate L  shoulder strength >=4+/5.    Baseline  Pt with grossly 4/5 strength in L shoulder.    Period  Weeks    Status  Partially Met      PT LONG TERM GOAL #4   Title  Patient to demonstrate L shoulder elevation to overhead shelf with 5lbs with good scapular mechanics and no evidence of compensations.    Baseline  Pt still compensating with shoulder hiking on the left    Time  6    Period  Weeks    Status  Partially Met      PT LONG TERM GOAL #5   Title  Patient to demonstrate lifting 10lb box from floor with good body mechanics and no pain.    Time  6    Period  Weeks    Status  Achieved            Plan - 08/18/19 1112    Clinical Impression Statement  Pt has attended 16 therapy visits and has met 3 goals and 3 goals  were partially met. Pt will continue to progress his HEP and feels that at rest he has no pain. Pain only noted with end range of motions. Pt will be placed on 30 day hold with updated exercise progress to continue at home. Pt was issued yellow, red and green therabands to progress his shoulder strength and pt able to properly return demonstrate. Pt has made improvements with AROM (see flowsheets) but still showing signs of functional limitation. Pt instructed to call us back in the 30 day time period if he feels he is not progressing or needs further instrucitons. Pt in agreement.    Personal Factors and Comorbidities  Age;Behavior Pattern;Time since onset of injury/illness/exacerbation;Comorbidity 3+;Past/Current Experience    Comorbidities  HTN, hx skin CA, anxiety, TKA 2012, R THA 2016, L TKA 2010, L5-S1 fusion 2010    Examination-Activity Limitations  Bathing;Sleep;Caring for Others;Carry;Dressing;Transfers;Hygiene/Grooming;Lift;Reach Overhead    Examination-Participation Restrictions  Cleaning;Shop;Community Activity;Driving;Yard Work;Interpersonal Relationship;Laundry;Meal Prep    Rehab Potential  Good    PT Frequency  1x / week    PT Duration  6 weeks    PT  Treatment/Interventions  ADLs/Self Care Home Management;Cryotherapy;Electrical Stimulation;Moist Heat;Therapeutic exercise;Therapeutic activities;Functional mobility training;Ultrasound;Neuromuscular re-education;Patient/family education;Manual techniques;Vasopneumatic Device;Taping;Splinting;Energy conservation;Dry needling;Passive range of motion;Scar mobilization    PT Next Visit Plan  30 day hold    PT Home Exercise Plan  HEP updated and reviewed    Consulted and Agree with Plan of Care  Patient       Patient will benefit from skilled therapeutic intervention in order to improve the following deficits and impairments:  Hypomobility, Increased edema, Decreased scar mobility, Decreased activity tolerance, Decreased strength, Impaired UE functional use, Pain, Decreased range of motion, Improper body mechanics, Postural dysfunction, Impaired flexibility  Visit Diagnosis: Acute pain of left shoulder  Stiffness of left shoulder, not elsewhere classified  Muscle weakness (generalized)     Problem List Patient Active Problem List   Diagnosis Date Noted  . Lumbar stenosis with neurogenic claudication 06/18/2019  . Traumatic complete tear of left rotator cuff 05/04/2019  . Lumbar facet arthropathy 10/30/2017  . Osteoarthritis of right hip 09/09/2015  . Status post total replacement of right hip 09/09/2015  . Chest pain 02/05/2014  . Palpitations 02/05/2014  . Dyspnea 02/05/2014  . Dehydration 02/05/2014    Oretha Caprice, MPT 08/18/2019, 11:18 AM  [facility] [address]  [place], [place], [address] Phone: [phone]   Fax:  [phone]  Name: ARISTOTLE LIEB MRN: [id number] Date of Birth: [birth year]  PHYSICAL THERAPY DISCHARGE SUMMARY  Visits from Start of Care: 16  Current functional level related to goals / functional outcomes: See above clinical impression; patient pleased with progress    Remaining  deficits: Decreased shoulder ROM, strength, functional activity tolerance    Education / Equipment: HEP  Plan: Patient agrees to discharge.  Patient goals were partially met. Patient is being discharged due to being pleased with the current functional level.  ?????     Janene Harvey, PT, DPT 10/15/19 11:35 AM

## 2019-08-20 ENCOUNTER — Other Ambulatory Visit: Payer: Self-pay

## 2019-08-20 ENCOUNTER — Ambulatory Visit
Admission: RE | Admit: 2019-08-20 | Discharge: 2019-08-20 | Disposition: A | Discharge status: Home / Self Care | Payer: Medicare Other | Source: Ambulatory Visit | Attending: Orthopaedic Surgery | Admitting: Orthopaedic Surgery

## 2019-08-20 DIAGNOSIS — R2232 Localized swelling, mass and lump, left upper limb: Secondary | ICD-10-CM

## 2019-08-20 MED ORDER — GADOBENATE DIMEGLUMINE 529 MG/ML IV SOLN
19.0000 mL | Freq: Once | INTRAVENOUS | Status: AC | PRN
  Administered 2019-08-20: 19 mL via INTRAVENOUS

## 2019-08-26 ENCOUNTER — Ambulatory Visit: Payer: Medicare Other | Admitting: Orthopaedic Surgery

## 2019-09-04 ENCOUNTER — Other Ambulatory Visit: Payer: Medicare Other

## 2019-09-14 ENCOUNTER — Ambulatory Visit: Payer: Medicare Other | Admitting: Physician Assistant

## 2019-09-14 ENCOUNTER — Other Ambulatory Visit: Payer: Self-pay

## 2019-09-14 ENCOUNTER — Encounter: Payer: Self-pay | Admitting: Physician Assistant

## 2019-09-14 DIAGNOSIS — R2232 Localized swelling, mass and lump, left upper limb: Secondary | ICD-10-CM

## 2019-09-14 NOTE — Progress Notes (Signed)
HPI: Mr. Fagley comes in today to review the MRI of his left arm.  Given the mass is becoming more painful.  He also feels like it is causing some nerve pain. MRI images reviewed with patient shows a well circumcised enhancing mass volar aspect of the left forearm.  Nerve runs along the course of the ulnar nerve consistent with a peripheral nerve sheath trigger such as swelling, or neurofibroma.  Physical exam : Left forearm mass unchanged from prior exam.  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  Will refer him to Dr. Burney Gauze for evaluation and definitive treatment.  Questions were encouraged and answered at length.  Follow-up as needed

## 2019-09-15 ENCOUNTER — Other Ambulatory Visit: Payer: Self-pay | Admitting: Radiology

## 2019-09-15 DIAGNOSIS — R2232 Localized swelling, mass and lump, left upper limb: Secondary | ICD-10-CM

## 2019-10-13 HISTORY — PX: EYE SURGERY: SHX253

## 2019-10-20 ENCOUNTER — Ambulatory Visit: Payer: Self-pay | Admitting: Emergency Medicine

## 2019-12-24 ENCOUNTER — Other Ambulatory Visit: Payer: Self-pay

## 2019-12-24 ENCOUNTER — Ambulatory Visit (INDEPENDENT_AMBULATORY_CARE_PROVIDER_SITE_OTHER): Payer: Medicare Other | Admitting: Emergency Medicine

## 2019-12-24 ENCOUNTER — Encounter: Payer: Self-pay | Admitting: Emergency Medicine

## 2019-12-24 VITALS — BP 145/81 | HR 79 | Temp 97.9°F | Resp 16 | Ht 67.0 in | Wt 227.0 lb

## 2019-12-24 DIAGNOSIS — I10 Essential (primary) hypertension: Secondary | ICD-10-CM | POA: Diagnosis not present

## 2019-12-24 DIAGNOSIS — Z96641 Presence of right artificial hip joint: Secondary | ICD-10-CM

## 2019-12-24 DIAGNOSIS — M2550 Pain in unspecified joint: Secondary | ICD-10-CM | POA: Diagnosis not present

## 2019-12-24 DIAGNOSIS — Z7689 Persons encountering health services in other specified circumstances: Secondary | ICD-10-CM

## 2019-12-24 DIAGNOSIS — M48062 Spinal stenosis, lumbar region with neurogenic claudication: Secondary | ICD-10-CM | POA: Diagnosis not present

## 2019-12-24 NOTE — Progress Notes (Signed)
Matthew [name] 64 y.o.   Chief Complaint  Patient presents with  . Establish Care  . Hypertension    HISTORY OF PRESENT ILLNESS: This is a 64 y.o. male first visit to this office, here to establish care with me. Has the following chronic medical problems: 1.  Hypertension: On hydrochlorothiazide 25 mg and lisinopril 20 mg daily. 2.  Chronic inflammation secondary to chronic joint pains status post multiple surgeries. 3.  Lumbar stenosis with neurogenic claudication Non-smoker.  Occasional and infrequent EtOH user. Up-to-date with colonoscopy, had it done recently with polyp finding. No history of diabetes. Most recent blood work done last summer reviewed, within normal limits.  HPI   Prior to Admission medications   Medication Sig Start Date End Date Taking? Authorizing Provider  ALPRAZolam Duanne Moron) 1 MG tablet Take 1 mg by mouth at bedtime as needed for sleep.    Yes [provider]  diazepam (VALIUM) 5 MG tablet TAKE ONE TAB ONE HOUR PRIOR TO MRI REPEAT AS NEEDED #2 . ZERO REFILLS 03/16/19  Yes Pete Pelt, PA-C  hydrochlorothiazide (HYDRODIURIL) 25 MG tablet Take 25 mg by mouth daily.   Yes [provider]  latanoprost (XALATAN) 0.005 % ophthalmic solution Place 1 drop into both eyes at bedtime.   Yes [provider]  lisinopril (PRINIVIL,ZESTRIL) 20 MG tablet Take 20 mg by mouth daily.     Yes [provider]  methocarbamol (ROBAXIN) 500 MG tablet Take 1 tablet (500 mg total) by mouth every 6 (six) hours as needed for muscle spasms. 06/19/19  Yes Kristeen Miss, MD  aspirin EC 325 MG EC tablet Take 1 tablet (325 mg total) by mouth 2 (two) times daily after a meal. Patient not taking: Reported on 12/24/2019 09/10/15   Mcarthur Rossetti, MD  celecoxib (CELEBREX) 200 MG capsule Take 1 capsule (200 mg total) by mouth 2 (two) times daily between meals as needed. Patient not taking: Reported on 12/24/2019 02/24/19   Mcarthur Rossetti, MD   HYDROcodone-acetaminophen (NORCO/VICODIN) 5-325 MG tablet TAKE 1 OR 2 TABLETS BY MOUTH EVERY 6 HOURS AS NEEDED FOR MODERATE PAIN Patient not taking: Reported on 12/24/2019 05/21/19   Mcarthur Rossetti, MD  oxyCODONE-acetaminophen (PERCOCET/ROXICET) 5-325 MG tablet Take 1-2 tablets by mouth every 4 (four) hours as needed for severe pain. Patient not taking: Reported on 12/24/2019 06/19/19   Kristeen Miss, MD    No Known Allergies  Patient Active Problem List   Diagnosis Date Noted  . Essential hypertension 12/24/2019  . Lumbar stenosis with neurogenic claudication 06/18/2019  . Lumbar facet arthropathy 10/30/2017  . Osteoarthritis of right hip 09/09/2015  . Status post total replacement of right hip 09/09/2015    Past Medical History:  Diagnosis Date  . Anxiety   . Arthritis    KNEES BACK SHOULDERS  . Cancer (HCC)    HX SKIN CANCER  . Heart murmur    "nothing to be concerned with, I ve had it all my life."  . Hx of nonmelanoma skin cancer   . Hypertension    FOLLOWED BY DR ED GREEN    Past Surgical History:  Procedure Laterality Date  . ANTERIOR LAT LUMBAR FUSION Left 06/18/2019   Procedure: Left Lumbar three-four Anterolateral lumbar decompression/interbody fusion with lateral plate fixation;  Surgeon: Kristeen Miss, MD;  Location: [facility];  Service: Neurosurgery;  Laterality: Left;  . BACK SURGERY  2010   L5S1 FUSION  . COLONOSCOPY  05/13/2019  . KNEE ARTHROPLASTY  2010  L  . KNEE ARTHROSCOPY     L X 2  . REPLACEMENT TOTAL KNEE Left 11/18/2008  . SHOULDER ARTHROSCOPY Left 05/14/2019  . TOTAL HIP ARTHROPLASTY Right 09/09/2015   Procedure: RIGHT TOTAL HIP ARTHROPLASTY ANTERIOR APPROACH;  Surgeon: Mcarthur Rossetti, MD;  Location: [facility];  Service: Orthopedics;  Laterality: Right;  . TOTAL KNEE ARTHROPLASTY  10/08/2011   Procedure: rightTOTAL KNEE ARTHROPLASTY;  Surgeon: Rudean Haskell, MD;  Location: [facility];  Service: Orthopedics;  Laterality: Right;  TOTAL RIGHT  KNEE ARTHROPLASTY     Social History   Socioeconomic History  . Marital status: Divorced    Spouse name: Not on file  . Number of children: Not on file  . Years of education: Not on file  . Highest education level: Not on file  Occupational History  . Not on file  Tobacco Use  . Smoking status: Never Smoker  . Smokeless tobacco: Never Used  Substance and Sexual Activity  . Alcohol use: Yes    Alcohol/week: 1.0 standard drinks    Types: 1 Cans of beer per week    Comment: OCCASIONAL  . Drug use: No  . Sexual activity: Not on file  Other Topics Concern  . Not on file  Social History Narrative  . Not on file   Social Determinants of Health   Financial Resource Strain:   . Difficulty of Paying Living Expenses: Not on file  Food Insecurity:   . Worried About Charity fundraiser in the Last Year: Not on file  . Ran Out of Food in the Last Year: Not on file  Transportation Needs:   . Lack of Transportation (Medical): Not on file  . Lack of Transportation (Non-Medical): Not on file  Physical Activity:   . Days of Exercise per Week: Not on file  . Minutes of Exercise per Session: Not on file  Stress:   . Feeling of Stress : Not on file  Social Connections:   . Frequency of Communication with Friends and Family: Not on file  . Frequency of Social Gatherings with Friends and Family: Not on file  . Attends Religious Services: Not on file  . Active Member of Clubs or Organizations: Not on file  . Attends Archivist Meetings: Not on file  . Marital Status: Not on file  Intimate Partner Violence:   . Fear of Current or Ex-Partner: Not on file  . Emotionally Abused: Not on file  . Physically Abused: Not on file  . Sexually Abused: Not on file    Family History  Problem Relation Age of Onset  . Heart attack Brother        25  . Heart attack Paternal Grandfather        5 bypass surgeries  . Cancer Father        died of bladder cancer      Review of Systems    Constitutional: Negative.  Negative for chills, fever and weight loss.  HENT: Negative.  Negative for congestion and sore throat.   Eyes: Negative.   Respiratory: Negative.  Negative for cough and shortness of breath.   Cardiovascular: Negative.  Negative for chest pain and palpitations.  Gastrointestinal: Negative.  Negative for abdominal pain, diarrhea, nausea and vomiting.  Genitourinary: Negative for dysuria and hematuria.       Poor libido  Musculoskeletal: Positive for joint pain. Negative for back pain, myalgias and neck pain.  Skin: Negative.  Negative for rash.  Neurological:  Negative.  Negative for dizziness and headaches.  Endo/Heme/Allergies: Negative.   All other systems reviewed and are negative.  Today's Vitals   12/24/19 0810  BP: (!) 145/81  Pulse: 79  Resp: 16  Temp: 97.9 F (36.6 C)  TempSrc: Temporal  SpO2: 96%  Weight: 227 lb (103 kg)  Height: 5\' 7"  (1.702 m)   Body mass index is 35.55 kg/m.   Physical Exam Vitals reviewed.  Constitutional:      Appearance: Normal appearance.  HENT:     Head: Normocephalic.  Eyes:     Extraocular Movements: Extraocular movements intact.     Conjunctiva/sclera: Conjunctivae normal.     Pupils: Pupils are equal, round, and reactive to light.  Neck:     Vascular: No carotid bruit.  Cardiovascular:     Rate and Rhythm: Normal rate and regular rhythm.     Pulses: Normal pulses.     Heart sounds: Normal heart sounds.  Pulmonary:     Effort: Pulmonary effort is normal.     Breath sounds: Normal breath sounds.  Abdominal:     General: There is no distension.     Palpations: Abdomen is soft.     Tenderness: There is no abdominal tenderness.  Musculoskeletal:        General: Normal range of motion.     Cervical back: Normal range of motion and neck supple.  Lymphadenopathy:     Cervical: No cervical adenopathy.  Skin:    General: Skin is warm and dry.     Capillary Refill: Capillary refill takes less than 2  seconds.  Neurological:     General: No focal deficit present.     Mental Status: He is alert and oriented to person, place, and time.  Psychiatric:        Mood and Affect: Mood normal.        Behavior: Behavior normal.      ASSESSMENT & PLAN: Ildefonso was seen today for establish care and hypertension.  Diagnoses and all orders for this visit:  Essential hypertension  Encounter to establish care  Lumbar stenosis with neurogenic claudication  Arthralgia of multiple joints  Status post total replacement of right hip    Patient Instructions       If you have lab work done today you will be contacted with your lab results within the next 2 weeks.  If you have not heard from [place] then please contact us. The fastest way to get your results is to register for My Chart.   IF you received an x-ray today, you will receive an invoice from [facility]. Please contact [facility] at [phone] with questions or concerns regarding your invoice.   IF you received labwork today, you will receive an invoice from [place]. Please contact [facility] at [phone] with questions or concerns regarding your invoice.   Our billing staff will not be able to assist you with questions regarding bills from these companies.  You will be contacted with the lab results as soon as they are available. The fastest way to get your results is to activate your My Chart account. Instructions are located on the last page of this paperwork. If you have not heard from [place] regarding the results in 2 weeks, please contact this office.     Health Maintenance, Male Adopting a healthy lifestyle and getting preventive care are important in promoting health and wellness. Ask your health care provider about:  The right schedule for you to have regular  tests and exams.  Things you can do on your own to prevent diseases and keep yourself healthy. What should I know about diet, weight, and  exercise? Eat a healthy diet   Eat a diet that includes plenty of vegetables, fruits, low-fat dairy products, and lean protein.  Do not eat a lot of foods that are high in solid fats, added sugars, or sodium. Maintain a healthy weight Body mass index (BMI) is a measurement that can be used to identify possible weight problems. It estimates body fat based on height and weight. Your health care provider can help determine your BMI and help you achieve or maintain a healthy weight. Get regular exercise Get regular exercise. This is one of the most important things you can do for your health. Most adults should:  Exercise for at least 150 minutes each week. The exercise should increase your heart rate and make you sweat (moderate-intensity exercise).  Do strengthening exercises at least twice a week. This is in addition to the moderate-intensity exercise.  Spend less time sitting. Even light physical activity can be beneficial. Watch cholesterol and blood lipids Have your blood tested for lipids and cholesterol at 65 years of age, then have this test every 5 years. You may need to have your cholesterol levels checked more often if:  Your lipid or cholesterol levels are high.  You are older than 65 years of age.  You are at high risk for heart disease. What should I know about cancer screening? Many types of cancers can be detected early and may often be prevented. Depending on your health history and family history, you may need to have cancer screening at various ages. This may include screening for:  Colorectal cancer.  Prostate cancer.  Skin cancer.  Lung cancer. What should I know about heart disease, diabetes, and high blood pressure? Blood pressure and heart disease  High blood pressure causes heart disease and increases the risk of stroke. This is more likely to develop in people who have high blood pressure readings, are of African descent, or are overweight.  Talk with  your health care provider about your target blood pressure readings.  Have your blood pressure checked: ? Every 3-5 years if you are 36-69 years of age. ? Every year if you are 76 years old or older.  If you are between the ages of 64 and 65 and are a current or former smoker, ask your health care provider if you should have a one-time screening for abdominal aortic aneurysm (AAA). Diabetes Have regular diabetes screenings. This checks your fasting blood sugar level. Have the screening done:  Once every three years after age 34 if you are at a normal weight and have a low risk for diabetes.  More often and at a younger age if you are overweight or have a high risk for diabetes. What should I know about preventing infection? Hepatitis B If you have a higher risk for hepatitis B, you should be screened for this virus. Talk with your health care provider to find out if you are at risk for hepatitis B infection. Hepatitis C Blood testing is recommended for:  Everyone born from [birth year] through 1966.  Anyone with known risk factors for hepatitis C. Sexually transmitted infections (STIs)  You should be screened each year for STIs, including gonorrhea and chlamydia, if: ? You are sexually active and are younger than 65 years of age. ? You are older than 65 years of age and your health  care provider tells you that you are at risk for this type of infection. ? Your sexual activity has changed since you were last screened, and you are at increased risk for chlamydia or gonorrhea. Ask your health care provider if you are at risk.  Ask your health care provider about whether you are at high risk for HIV. Your health care provider may recommend a prescription medicine to help prevent HIV infection. If you choose to take medicine to prevent HIV, you should first get tested for HIV. You should then be tested every 3 months for as long as you are taking the medicine. Follow these instructions at  home: Lifestyle  Do not use any products that contain nicotine or tobacco, such as cigarettes, e-cigarettes, and chewing tobacco. If you need help quitting, ask your health care provider.  Do not use street drugs.  Do not share needles.  Ask your health care provider for help if you need support or information about quitting drugs. Alcohol use  Do not drink alcohol if your health care provider tells you not to drink.  If you drink alcohol: ? Limit how much you have to 0-2 drinks a day. ? Be aware of how much alcohol is in your drink. In the [place] one drink equals one 12 oz bottle of beer (355 mL), one 5 oz glass of wine (148 mL), or one 1 oz glass of hard liquor (44 mL). General instructions  Schedule regular health, dental, and eye exams.  Stay current with your vaccines.  Tell your health care provider if: ? You often feel depressed. ? You have ever been abused or do not feel safe at home. Summary  Adopting a healthy lifestyle and getting preventive care are important in promoting health and wellness.  Follow your health care provider's instructions about healthy diet, exercising, and getting tested or screened for diseases.  Follow your health care provider's instructions on monitoring your cholesterol and blood pressure. This information is not intended to replace advice given to you by your health care provider. Make sure you discuss any questions you have with your health care provider. Document Revised: 10/22/2018 Document Reviewed: 10/22/2018 Elsevier Patient Education  2020 Elsevier Inc.      Agustina Caroli, MD [facility]

## 2019-12-24 NOTE — Patient Instructions (Addendum)
   If you have lab work done today you will be contacted with your lab results within the next 2 weeks.  If you have not heard from us then please contact us. The fastest way to get your results is to register for My Chart.   IF you received an x-ray today, you will receive an invoice from [facility]. Please contact [facility] at [phone] with questions or concerns regarding your invoice.   IF you received labwork today, you will receive an invoice from [facility]. Please contact [facility] at [phone] with questions or concerns regarding your invoice.   Our billing staff will not be able to assist you with questions regarding bills from these companies.  You will be contacted with the lab results as soon as they are available. The fastest way to get your results is to activate your My Chart account. Instructions are located on the last page of this paperwork. If you have not heard from us regarding the results in 2 weeks, please contact this office.      Health Maintenance, Male Adopting a healthy lifestyle and getting preventive care are important in promoting health and wellness. Ask your health care provider about:  The right schedule for you to have regular tests and exams.  Things you can do on your own to prevent diseases and keep yourself healthy. What should I know about diet, weight, and exercise? Eat a healthy diet   Eat a diet that includes plenty of vegetables, fruits, low-fat dairy products, and lean protein.  Do not eat a lot of foods that are high in solid fats, added sugars, or sodium. Maintain a healthy weight Body mass index (BMI) is a measurement that can be used to identify possible weight problems. It estimates body fat based on height and weight. Your health care provider can help determine your BMI and help you achieve or maintain a healthy weight. Get regular exercise Get regular exercise. This is one of the most important things you  can do for your health. Most adults should:  Exercise for at least 150 minutes each week. The exercise should increase your heart rate and make you sweat (moderate-intensity exercise).  Do strengthening exercises at least twice a week. This is in addition to the moderate-intensity exercise.  Spend less time sitting. Even light physical activity can be beneficial. Watch cholesterol and blood lipids Have your blood tested for lipids and cholesterol at 65 years of age, then have this test every 5 years. You may need to have your cholesterol levels checked more often if:  Your lipid or cholesterol levels are high.  You are older than 65 years of age.  You are at high risk for heart disease. What should I know about cancer screening? Many types of cancers can be detected early and may often be prevented. Depending on your health history and family history, you may need to have cancer screening at various ages. This may include screening for:  Colorectal cancer.  Prostate cancer.  Skin cancer.  Lung cancer. What should I know about heart disease, diabetes, and high blood pressure? Blood pressure and heart disease  High blood pressure causes heart disease and increases the risk of stroke. This is more likely to develop in people who have high blood pressure readings, are of African descent, or are overweight.  Talk with your health care provider about your target blood pressure readings.  Have your blood pressure checked: ? Every 3-5 years if you are 18-39   years of age. ? Every year if you are 40 years old or older.  If you are between the ages of 65 and 75 and are a current or former smoker, ask your health care provider if you should have a one-time screening for abdominal aortic aneurysm (AAA). Diabetes Have regular diabetes screenings. This checks your fasting blood sugar level. Have the screening done:  Once every three years after age 45 if you are at a normal weight and have  a low risk for diabetes.  More often and at a younger age if you are overweight or have a high risk for diabetes. What should I know about preventing infection? Hepatitis B If you have a higher risk for hepatitis B, you should be screened for this virus. Talk with your health care provider to find out if you are at risk for hepatitis B infection. Hepatitis C Blood testing is recommended for:  Everyone born from 1945 through 1965.  Anyone with known risk factors for hepatitis C. Sexually transmitted infections (STIs)  You should be screened each year for STIs, including gonorrhea and chlamydia, if: ? You are sexually active and are younger than 65 years of age. ? You are older than 65 years of age and your health care provider tells you that you are at risk for this type of infection. ? Your sexual activity has changed since you were last screened, and you are at increased risk for chlamydia or gonorrhea. Ask your health care provider if you are at risk.  Ask your health care provider about whether you are at high risk for HIV. Your health care provider may recommend a prescription medicine to help prevent HIV infection. If you choose to take medicine to prevent HIV, you should first get tested for HIV. You should then be tested every 3 months for as long as you are taking the medicine. Follow these instructions at home: Lifestyle  Do not use any products that contain nicotine or tobacco, such as cigarettes, e-cigarettes, and chewing tobacco. If you need help quitting, ask your health care provider.  Do not use street drugs.  Do not share needles.  Ask your health care provider for help if you need support or information about quitting drugs. Alcohol use  Do not drink alcohol if your health care provider tells you not to drink.  If you drink alcohol: ? Limit how much you have to 0-2 drinks a day. ? Be aware of how much alcohol is in your drink. In the [place] one drink equals one 12  oz bottle of beer (355 mL), one 5 oz glass of wine (148 mL), or one 1 oz glass of hard liquor (44 mL). General instructions  Schedule regular health, dental, and eye exams.  Stay current with your vaccines.  Tell your health care provider if: ? You often feel depressed. ? You have ever been abused or do not feel safe at home. Summary  Adopting a healthy lifestyle and getting preventive care are important in promoting health and wellness.  Follow your health care provider's instructions about healthy diet, exercising, and getting tested or screened for diseases.  Follow your health care provider's instructions on monitoring your cholesterol and blood pressure. This information is not intended to replace advice given to you by your health care provider. Make sure you discuss any questions you have with your health care provider. Document Revised: 10/22/2018 Document Reviewed: 10/22/2018 Elsevier Patient Education  2020 Elsevier Inc.  

## 2020-03-14 ENCOUNTER — Other Ambulatory Visit: Payer: Self-pay

## 2020-03-14 ENCOUNTER — Encounter (INDEPENDENT_AMBULATORY_CARE_PROVIDER_SITE_OTHER): Payer: Self-pay | Admitting: Ophthalmology

## 2020-03-14 ENCOUNTER — Ambulatory Visit (INDEPENDENT_AMBULATORY_CARE_PROVIDER_SITE_OTHER): Payer: Medicare Other | Admitting: Ophthalmology

## 2020-03-14 DIAGNOSIS — H35351 Cystoid macular degeneration, right eye: Secondary | ICD-10-CM

## 2020-03-14 DIAGNOSIS — R0683 Snoring: Secondary | ICD-10-CM

## 2020-03-14 DIAGNOSIS — H35371 Puckering of macula, right eye: Secondary | ICD-10-CM | POA: Diagnosis not present

## 2020-03-14 DIAGNOSIS — H2512 Age-related nuclear cataract, left eye: Secondary | ICD-10-CM

## 2020-03-14 DIAGNOSIS — Z961 Presence of intraocular lens: Secondary | ICD-10-CM | POA: Diagnosis not present

## 2020-03-14 NOTE — Assessment & Plan Note (Signed)
The nature of cystoid macular edema including causes, exacerbating factors, and treatments including steroid and nonsteroidal anti-inflammatory drops, periocular injections of steroids, and intravitreal injections of steroids were reviewed. An informational brochure was offered.  The patient's questions were answered. The potential side effects of the various treatments were reviewed including the potential for intraocular pressure rise with steroid treatments.  I often use topical NSAIDS alone or in conjunction with periocular steroids to resolve this condition.   Patient instructed not to compress or "rub" on the eye.  More rarely, surgery may be considered if the condition is not improving.    OD has improved on topical nonsteroidals will taper to twice daily for completion of the current bottle.  If if visual acuity blurs again he may choose to restart it 3 times a day and then taper to twice a day but to contact the office for follow-up visit

## 2020-03-14 NOTE — Progress Notes (Signed)
03/14/2020     CHIEF COMPLAINT Patient presents for Cystoid Macular Edema and Retina Follow Up   HISTORY OF PRESENT ILLNESS: Matthew [name] is a 64 y.o. male who presents to the clinic today for:   HPI    Retina Follow Up    Patient presents with  Other (CME).  In right eye.  Severity is moderate.  Duration of 6 weeks.  Since onset it is gradually improving.  I, the attending physician,  performed the HPI with the patient and updated documentation appropriately.          Comments    6 Week CME f\u OD. OCT, patient has been using NSAIDs topically 4 times daily and more recently mostly 3 times daily.  He has noticed an improved visual acuity and reading vision right eye.  Pt states OD is doing well. Pt has been using gtts as directed.       Last edited by Hurman Horn, MD on 03/14/2020  1:38 PM. (History)      Referring physician: Horald Pollen, MD [place],  [place] [address]  HISTORICAL INFORMATION:   Selected notes from the MEDICAL RECORD NUMBER       CURRENT MEDICATIONS: Current Outpatient Medications (Ophthalmic Drugs)  Medication Sig  . ketorolac (ACULAR) 0.5 % ophthalmic solution Place 1 drop into the right eye 4 (four) times daily.  Marland Kitchen latanoprost (XALATAN) 0.005 % ophthalmic solution Place 1 drop into both eyes at bedtime.   No current facility-administered medications for this visit. (Ophthalmic Drugs)   Current Outpatient Medications (Other)  Medication Sig  . ALPRAZolam (XANAX) 1 MG tablet Take 1 mg by mouth at bedtime as needed for sleep.   Marland Kitchen aspirin EC 325 MG EC tablet Take 1 tablet (325 mg total) by mouth 2 (two) times daily after a meal. (Patient not taking: Reported on 12/24/2019)  . celecoxib (CELEBREX) 200 MG capsule Take 1 capsule (200 mg total) by mouth 2 (two) times daily between meals as needed. (Patient not taking: Reported on 12/24/2019)  . diazepam (VALIUM) 5 MG tablet TAKE ONE TAB ONE HOUR PRIOR TO MRI REPEAT AS NEEDED #2 .  ZERO REFILLS  . hydrochlorothiazide (HYDRODIURIL) 25 MG tablet Take 25 mg by mouth daily.  Marland Kitchen HYDROcodone-acetaminophen (NORCO/VICODIN) 5-325 MG tablet TAKE 1 OR 2 TABLETS BY MOUTH EVERY 6 HOURS AS NEEDED FOR MODERATE PAIN (Patient not taking: Reported on 12/24/2019)  . lisinopril (PRINIVIL,ZESTRIL) 20 MG tablet Take 20 mg by mouth daily.    . methocarbamol (ROBAXIN) 500 MG tablet Take 1 tablet (500 mg total) by mouth every 6 (six) hours as needed for muscle spasms.  Marland Kitchen oxyCODONE-acetaminophen (PERCOCET/ROXICET) 5-325 MG tablet Take 1-2 tablets by mouth every 4 (four) hours as needed for severe pain. (Patient not taking: Reported on 12/24/2019)   No current facility-administered medications for this visit. (Other)      REVIEW OF SYSTEMS:    ALLERGIES No Known Allergies  PAST MEDICAL HISTORY Past Medical History:  Diagnosis Date  . Anxiety   . Arthritis    KNEES BACK SHOULDERS  . Cancer (HCC)    HX SKIN CANCER  . Cataracts, bilateral   . Heart murmur    "nothing to be concerned with, I ve had it all my life."  . Hx of nonmelanoma skin cancer   . Hypertension    FOLLOWED BY DR ED GREEN   Past Surgical History:  Procedure Laterality Date  . ANTERIOR LAT LUMBAR FUSION Left  06/18/2019   Procedure: Left Lumbar three-four Anterolateral lumbar decompression/interbody fusion with lateral plate fixation;  Surgeon: Kristeen Miss, MD;  Location: [facility];  Service: Neurosurgery;  Laterality: Left;  . BACK SURGERY  2010   L5S1 FUSION  . COLONOSCOPY  05/13/2019  . EYE SURGERY  10/2019   per patient retina  . KNEE ARTHROPLASTY  2010   L  . KNEE ARTHROSCOPY     L X 2  . REPLACEMENT TOTAL KNEE Left 11/18/2008  . SHOULDER ARTHROSCOPY Left 05/14/2019  . TOTAL HIP ARTHROPLASTY Right 09/09/2015   Procedure: RIGHT TOTAL HIP ARTHROPLASTY ANTERIOR APPROACH;  Surgeon: Mcarthur Rossetti, MD;  Location: [facility];  Service: Orthopedics;  Laterality: Right;  . TOTAL KNEE ARTHROPLASTY  10/08/2011    Procedure: rightTOTAL KNEE ARTHROPLASTY;  Surgeon: Rudean Haskell, MD;  Location: [facility];  Service: Orthopedics;  Laterality: Right;  TOTAL RIGHT KNEE ARTHROPLASTY     FAMILY HISTORY Family History  Problem Relation Age of Onset  . Heart attack Brother        3  . Hypertension Brother   . Stroke Brother   . Heart attack Paternal Grandfather        5 bypass surgeries  . Prostate cancer Paternal Grandfather   . Cancer Father        died of bladder cancer, prostate  . Cancer Maternal Grandmother   . Hypertension Brother     SOCIAL HISTORY Social History   Tobacco Use  . Smoking status: Never Smoker  . Smokeless tobacco: Never Used  Substance Use Topics  . Alcohol use: Yes    Alcohol/week: 2.0 standard drinks    Types: 2 Cans of beer per week    Comment: OCCASIONAL-wine, beer,mixed  . Drug use: No         OPHTHALMIC EXAM:  Base Eye Exam    Visual Acuity (Snellen - Linear)      Right Left   Dist [place] 20/25 20/25 -1       Tonometry (Tonopen, 1:15 PM)      Right Left   Pressure 19 17       Pupils      Pupils Dark Light Shape React APD   Right PERRL 3 2 Round Brisk None   Left PERRL 3 2 Round Brisk None       Visual Fields (Counting fingers)      Left Right    Full Full       Neuro/Psych    Oriented x3: Yes   Mood/Affect: Normal       Dilation    Right eye: 1.0% Mydriacyl, 2.5% Phenylephrine @ 1:15 PM        Slit Lamp and Fundus Exam    External Exam      Right Left   External Normal Normal       Slit Lamp Exam      Right Left   Lids/Lashes Normal Normal   Conjunctiva/Sclera White and quiet White and quiet   Cornea Clear Clear   Anterior Chamber Deep and quiet Deep and quiet   Iris Round and reactive Round and reactive   Lens Posterior chamber intraocular lens Clear   Anterior Vitreous Normal Normal       Fundus Exam      Right Left   Posterior Vitreous Clear vitrectomized    Disc Normal    C/D Ratio 0.5    Macula Normal    Vessels  Normal    Periphery Normal  IMAGING AND PROCEDURES  Imaging and Procedures for 03/14/20  OCT, Retina - OU - Both Eyes       Right Eye Quality was good. Scan locations included subfoveal. Central Foveal Thickness: 410. Progression has improved.   Left Eye Quality was good. Scan locations included subfoveal. Central Foveal Thickness: 291. Progression has been stable.   Notes OD minor thickening remains post development of epiretinal membrane.  Post ILM peel the overall thickness is improved from 520 down to 410 m.  Recent CME has completely resolved OD  OS normal OCT of the macula                 ASSESSMENT/PLAN:  Cystoid macular edema of right eye The nature of cystoid macular edema including causes, exacerbating factors, and treatments including steroid and nonsteroidal anti-inflammatory drops, periocular injections of steroids, and intravitreal injections of steroids were reviewed. An informational brochure was offered.  The patient's questions were answered. The potential side effects of the various treatments were reviewed including the potential for intraocular pressure rise with steroid treatments.  I often use topical NSAIDS alone or in conjunction with periocular steroids to resolve this condition.   Patient instructed not to compress or "rub" on the eye.  More rarely, surgery may be considered if the condition is not improving.    OD has improved on topical nonsteroidals will taper to twice daily for completion of the current bottle.  If if visual acuity blurs again he may choose to restart it 3 times a day and then taper to twice a day but to contact the office for follow-up visit        ICD-10-CM   1. Right epiretinal membrane  H35.371 OCT, Retina - OU - Both Eyes  2. Cystoid macular edema of right eye  H35.351 OCT, Retina - OU - Both Eyes  3. Nuclear sclerotic cataract of left eye  H25.12   4. Pseudophakia  Z96.1   5. Snoring  R06.83     1.   Patient will continue on topical NSAIDs twice daily until current bottle is complete.  2.  Good blurred vision return, patient to call for follow-up visit but could restart the topical NSAID 3 times daily in the meantime.  3.  Ophthalmic Meds Ordered this visit:  No orders of the defined types were placed in this encounter.      Return in about 6 months (around 09/14/2020) for DILATE OU, OCT.  There are no Patient Instructions on file for this visit.   Explained the diagnoses, plan, and follow up with the patient and they expressed understanding.  Patient expressed understanding of the importance of proper follow up care.   Clent Demark Henrine Hayter M.D. Diseases & Surgery of the Retina and Vitreous [facility] [place] 03/14/20     Abbreviations: M myopia (nearsighted); A astigmatism; H hyperopia (farsighted); P presbyopia; Mrx spectacle prescription;  CTL contact lenses; OD right eye; OS left eye; OU both eyes  XT exotropia; ET esotropia; PEK punctate epithelial keratitis; PEE punctate epithelial erosions; DES dry eye syndrome; MGD meibomian gland dysfunction; ATs artificial tears; PFAT's preservative free artificial tears; [place] nuclear sclerotic cataract; PSC posterior subcapsular cataract; ERM epi-retinal membrane; PVD posterior vitreous detachment; RD retinal detachment; DM diabetes mellitus; DR diabetic retinopathy; NPDR non-proliferative diabetic retinopathy; PDR proliferative diabetic retinopathy; CSME clinically significant macular edema; DME diabetic macular edema; dbh dot blot hemorrhages; CWS cotton wool spot; POAG primary open angle glaucoma; C/D cup-to-disc ratio; HVF humphrey visual field;  GVF goldmann visual field; OCT optical coherence tomography; IOP intraocular pressure; BRVO Branch retinal vein occlusion; CRVO central retinal vein occlusion; CRAO central retinal artery occlusion; BRAO branch retinal artery occlusion; RT retinal tear; SB scleral buckle; PPV pars plana  vitrectomy; VH Vitreous hemorrhage; PRP panretinal laser photocoagulation; IVK intravitreal kenalog; VMT vitreomacular traction; MH Macular hole;  NVD neovascularization of the disc; NVE neovascularization elsewhere; AREDS age related eye disease study; ARMD age related macular degeneration; POAG primary open angle glaucoma; EBMD epithelial/anterior basement membrane dystrophy; ACIOL anterior chamber intraocular lens; IOL intraocular lens; PCIOL posterior chamber intraocular lens; Phaco/IOL phacoemulsification with intraocular lens placement; [place] photorefractive keratectomy; LASIK laser assisted in situ keratomileusis; HTN hypertension; DM diabetes mellitus; COPD chronic obstructive pulmonary disease

## 2020-06-03 ENCOUNTER — Telehealth: Payer: Self-pay | Admitting: Orthopaedic Surgery

## 2020-06-03 ENCOUNTER — Encounter: Payer: Self-pay | Admitting: Orthopaedic Surgery

## 2020-06-03 ENCOUNTER — Ambulatory Visit: Payer: Medicare Other | Admitting: Orthopaedic Surgery

## 2020-06-03 VITALS — Ht 67.0 in | Wt 215.0 lb

## 2020-06-03 DIAGNOSIS — M25511 Pain in right shoulder: Secondary | ICD-10-CM

## 2020-06-03 DIAGNOSIS — G8929 Other chronic pain: Secondary | ICD-10-CM

## 2020-06-03 MED ORDER — ALPRAZOLAM 1 MG PO TABS: 1.0000 mg | ORAL_TABLET | Freq: Every evening | ORAL | 0 refills | Status: AC | PRN

## 2020-06-04 NOTE — Progress Notes (Signed)
Office Visit Note   Patient: LARENCE THONE           Date of Birth: 08-Jun-1956           MRN: [id number] Visit Date: 06/03/2020              Requested by: Horald Pollen, MD [place],  [place] [address] PCP: Horald Pollen, MD   Assessment & Plan: Visit Diagnoses:  1. Chronic right shoulder pain   2. Acute pain of right shoulder     Plan: Impression is acute right shoulder injury concerning for rotator cuff tear.  We will need to obtain MRI of the right shoulder to evaluate for this.  Patient will follow up with Dr. Ninfa Linden after the MRI.  Follow-Up Instructions: Return if symptoms worsen or fail to improve.   Orders:  Orders Placed This Encounter  Procedures  . MR SHOULDER RIGHT WO CONTRAST   Meds ordered this encounter  Medications  . ALPRAZolam (XANAX) 1 MG tablet    Sig: Take 1 tablet (1 mg total) by mouth at bedtime as needed for sleep.    Dispense:  10 tablet    Refill:  0      Procedures: No procedures performed   Clinical Data: No additional findings.   Subjective: Chief Complaint  Patient presents with  . Right Shoulder - Pain    DOI 05/30/2020    Jeromy is a very pleasant 64 year old gentleman who is an established patient with Dr. Ninfa Linden who comes in to see me for recent injury to his right shoulder.  He was playing pickle ball for the first time earlier this week when he was running for a low ball and he fell directly onto his right shoulder.  Since then he has had pain especially with raising his arm and decreased range of motion.  He has been taking Tylenol, hydrocodone, using ice and heat.  [facility] and x-rays demonstrated no acute abnormalities.   Review of Systems  Constitutional: Negative.   All other systems reviewed and are negative.    Objective: Vital Signs: Ht 5\' 7"  (1.702 m)   Wt (!) 215 lb (97.5 kg)   BMI 33.67 kg/m   Physical Exam Vitals and nursing note reviewed.    Constitutional:      Appearance: He is well-developed.  Pulmonary:     Effort: Pulmonary effort is normal.  Abdominal:     Palpations: Abdomen is soft.  Skin:    General: Skin is warm.  Neurological:     Mental Status: He is alert and oriented to person, place, and time.  Psychiatric:        Behavior: Behavior normal.        Thought Content: Thought content normal.        Judgment: Judgment normal.     Ortho Exam Right shoulder shows mild discomfort with palpation of the [facility] joint which feels stable.  There is significant pain with passive range of motion beyond shoulder level.  Positive drop arm sign.  Exam is limited by pain. Specialty Comments:  No specialty comments available.  Imaging: No results found.   PMFS History: Patient Active Problem List   Diagnosis Date Noted  . Right epiretinal membrane 03/14/2020  . Cystoid macular edema of right eye 03/14/2020  . Nuclear sclerotic cataract of left eye 03/14/2020  . Pseudophakia 03/14/2020  . Snoring 03/14/2020  . Essential hypertension 12/24/2019  . Lumbar  stenosis with neurogenic claudication 06/18/2019  . Lumbar facet arthropathy 10/30/2017  . Osteoarthritis of right hip 09/09/2015  . Status post total replacement of right hip 09/09/2015   Past Medical History:  Diagnosis Date  . Anxiety   . Arthritis    KNEES BACK SHOULDERS  . Cancer (HCC)    HX SKIN CANCER  . Cataracts, bilateral   . Heart murmur    "nothing to be concerned with, I ve had it all my life."  . Hx of nonmelanoma skin cancer   . Hypertension    FOLLOWED BY DR ED GREEN    Family History  Problem Relation Age of Onset  . Heart attack Brother        23  . Hypertension Brother   . Stroke Brother   . Heart attack Paternal Grandfather        5 bypass surgeries  . Prostate cancer Paternal Grandfather   . Cancer Father        died of bladder cancer, prostate  . Cancer Maternal Grandmother   . Hypertension Brother     Past Surgical  History:  Procedure Laterality Date  . ANTERIOR LAT LUMBAR FUSION Left 06/18/2019   Procedure: Left Lumbar three-four Anterolateral lumbar decompression/interbody fusion with lateral plate fixation;  Surgeon: Kristeen Miss, MD;  Location: [facility];  Service: Neurosurgery;  Laterality: Left;  . BACK SURGERY  2010   L5S1 FUSION  . COLONOSCOPY  05/13/2019  . EYE SURGERY  10/2019   per patient retina  . KNEE ARTHROPLASTY  2010   L  . KNEE ARTHROSCOPY     L X 2  . REPLACEMENT TOTAL KNEE Left 11/18/2008  . SHOULDER ARTHROSCOPY Left 05/14/2019  . TOTAL HIP ARTHROPLASTY Right 09/09/2015   Procedure: RIGHT TOTAL HIP ARTHROPLASTY ANTERIOR APPROACH;  Surgeon: Mcarthur Rossetti, MD;  Location: [facility];  Service: Orthopedics;  Laterality: Right;  . TOTAL KNEE ARTHROPLASTY  10/08/2011   Procedure: rightTOTAL KNEE ARTHROPLASTY;  Surgeon: Rudean Haskell, MD;  Location: [facility];  Service: Orthopedics;  Laterality: Right;  TOTAL RIGHT KNEE ARTHROPLASTY    Social History   Occupational History  . Not on file  Tobacco Use  . Smoking status: Never Smoker  . Smokeless tobacco: Never Used  Vaping Use  . Vaping Use: Never used  Substance and Sexual Activity  . Alcohol use: Yes    Alcohol/week: 2.0 standard drinks    Types: 2 Cans of beer per week    Comment: OCCASIONAL-wine, beer,mixed  . Drug use: No  . Sexual activity: Not on file

## 2020-06-13 ENCOUNTER — Telehealth (INDEPENDENT_AMBULATORY_CARE_PROVIDER_SITE_OTHER): Payer: Medicare Other | Admitting: Emergency Medicine

## 2020-06-13 ENCOUNTER — Other Ambulatory Visit: Payer: Self-pay

## 2020-06-13 ENCOUNTER — Encounter: Payer: Self-pay | Admitting: Emergency Medicine

## 2020-06-13 VITALS — Temp 98.0°F | Ht 67.0 in

## 2020-06-13 DIAGNOSIS — B9789 Other viral agents as the cause of diseases classified elsewhere: Secondary | ICD-10-CM

## 2020-06-13 DIAGNOSIS — R6889 Other general symptoms and signs: Secondary | ICD-10-CM | POA: Diagnosis not present

## 2020-06-13 DIAGNOSIS — J988 Other specified respiratory disorders: Secondary | ICD-10-CM | POA: Diagnosis not present

## 2020-06-13 NOTE — Patient Instructions (Signed)
° ° ° °  If you have lab work done today you will be contacted with your lab results within the next 2 weeks.  If you have not heard from us then please contact us. The fastest way to get your results is to register for My Chart. ° ° °IF you received an x-ray today, you will receive an invoice from [facility]. Please contact [facility] at [phone] with questions or concerns regarding your invoice.  ° °IF you received labwork today, you will receive an invoice from [facility]. Please contact [facility] at [phone] with questions or concerns regarding your invoice.  ° °Our billing staff will not be able to assist you with questions regarding bills from these companies. ° °You will be contacted with the lab results as soon as they are available. The fastest way to get your results is to activate your My Chart account. Instructions are located on the last page of this paperwork. If you have not heard from us regarding the results in 2 weeks, please contact this office. °  ° ° ° °

## 2020-06-13 NOTE — Progress Notes (Signed)
Telemedicine Encounter- SOAP NOTE Established Patient  This telephone encounter was conducted with the patient's (or proxy's) verbal consent via audio telecommunications: yes/no: Yes Patient was instructed to have this encounter in a suitably private space; and to only have persons present to whom they give permission to participate. In addition, patient identity was confirmed by use of name plus two identifiers (DOB and address).  I discussed the limitations, risks, security and privacy concerns of performing an evaluation and management service by telephone and the availability of in person appointments. I also discussed with the patient that there may be a patient responsible charge related to this service. The patient expressed understanding and agreed to proceed.  I spent a total of TIME; 0 MIN TO 60 MIN: 20 minutes talking with the patient or their proxy.  Chief Complaint  Patient presents with   chest and sinus congestion    started tue night. and by Friday it was like a full cold. Tried OTC and it has not helped    Cough & HA   ears are stopped up    Subjective   Matthew [name] is a 64 y.o. male established patient. Telephone visit today complaining of flulike symptoms that started 1 week ago and peaked 3 days later.  Much improved today.  Has history of hypertension so he took some Coricidin and Mucinex with relief.  No other complaints or medical concerns today.  HPI   Patient Active Problem List   Diagnosis Date Noted   Right epiretinal membrane 03/14/2020   Cystoid macular edema of right eye 03/14/2020   Nuclear sclerotic cataract of left eye 03/14/2020   Pseudophakia 03/14/2020   Snoring 03/14/2020   Essential hypertension 12/24/2019   Lumbar stenosis with neurogenic claudication 06/18/2019   Lumbar facet arthropathy 10/30/2017   Osteoarthritis of right hip 09/09/2015   Status post total replacement of right hip 09/09/2015    Past Medical  History:  Diagnosis Date   Anxiety    Arthritis    KNEES BACK SHOULDERS   Cancer ([place])    HX SKIN CANCER   Cataracts, bilateral    Heart murmur    "nothing to be concerned with, I ve had it all my life."   Hx of nonmelanoma skin cancer    Hypertension    FOLLOWED BY DR ED GREEN    Current Outpatient Medications  Medication Sig Dispense Refill   ALPRAZolam (XANAX) 1 MG tablet Take 1 tablet (1 mg total) by mouth at bedtime as needed for sleep. 10 tablet 0   aspirin EC 325 MG EC tablet Take 1 tablet (325 mg total) by mouth 2 (two) times daily after a meal. (Patient not taking: Reported on 12/24/2019) 30 tablet 0   celecoxib (CELEBREX) 200 MG capsule Take 1 capsule (200 mg total) by mouth 2 (two) times daily between meals as needed. (Patient not taking: Reported on 12/24/2019) 60 capsule 3   diazepam (VALIUM) 5 MG tablet TAKE ONE TAB ONE HOUR PRIOR TO MRI REPEAT AS NEEDED #2 . ZERO REFILLS 2 tablet 0   hydrochlorothiazide (HYDRODIURIL) 25 MG tablet Take 25 mg by mouth daily.     HYDROcodone-acetaminophen (NORCO/VICODIN) 5-325 MG tablet TAKE 1 OR 2 TABLETS BY MOUTH EVERY 6 HOURS AS NEEDED FOR MODERATE PAIN 50 tablet 0   ketorolac (ACULAR) 0.5 % ophthalmic solution Place 1 drop into the right eye 4 (four) times daily.     latanoprost (XALATAN) 0.005 % ophthalmic solution Place 1 drop into  both eyes at bedtime.     lisinopril (PRINIVIL,ZESTRIL) 20 MG tablet Take 20 mg by mouth daily.       methocarbamol (ROBAXIN) 500 MG tablet Take 1 tablet (500 mg total) by mouth every 6 (six) hours as needed for muscle spasms. (Patient not taking: Reported on 06/03/2020) 40 tablet 3   oxyCODONE-acetaminophen (PERCOCET/ROXICET) 5-325 MG tablet Take 1-2 tablets by mouth every 4 (four) hours as needed for severe pain. (Patient not taking: Reported on 12/24/2019) 30 tablet 0   No current facility-administered medications for this visit.    No Known Allergies  Social History   Socioeconomic  History   Marital status: Divorced    Spouse name: Not on file   Number of children: Not on file   Years of education: Not on file   Highest education level: Not on file  Occupational History   Not on file  Tobacco Use   Smoking status: Never Smoker   Smokeless tobacco: Never Used  Vaping Use   Vaping Use: Never used  Substance and Sexual Activity   Alcohol use: Yes    Alcohol/week: 2.0 standard drinks    Types: 2 Cans of beer per week    Comment: OCCASIONAL-wine, beer,mixed   Drug use: No   Sexual activity: Not on file  Other Topics Concern   Not on file  Social History Narrative   Not on file   Social Determinants of Health   Financial Resource Strain:    Difficulty of Paying Living Expenses:   Food Insecurity:    Worried About Charity fundraiser in the Last Year:    Arboriculturist in the Last Year:   Transportation Needs:    Film/video editor (Medical):    Lack of Transportation (Non-Medical):   Physical Activity:    Days of Exercise per Week:    Minutes of Exercise per Session:   Stress:    Feeling of Stress :   Social Connections:    Frequency of Communication with Friends and Family:    Frequency of Social Gatherings with Friends and Family:    Attends Religious Services:    Active Member of Clubs or Organizations:    Attends Music therapist:    Marital Status:   Intimate Partner Violence:    Fear of Current or Ex-Partner:    Emotionally Abused:    Physically Abused:    Sexually Abused:     Review of Systems  Constitutional: Negative.  Negative for chills and fever.  HENT: Positive for congestion. Negative for sore throat.   Respiratory: Positive for cough. Negative for sputum production, shortness of breath and wheezing.   Cardiovascular: Negative.  Negative for chest pain and palpitations.  Gastrointestinal: Negative.  Negative for abdominal pain, blood in stool, melena, nausea and vomiting.    Genitourinary: Negative.  Negative for dysuria and hematuria.  Musculoskeletal: Negative.  Negative for back pain, joint pain and neck pain.  Skin: Negative.  Negative for rash.  Neurological: Positive for headaches.  All other systems reviewed and are negative.   Objective  Alert and oriented x3 in no apparent respiratory distress Vitals as reported by the patient: Today's Vitals   06/13/20 0926  Temp: 98 F (36.7 C)  TempSrc: Oral  Height: 5\' 7"  (1.702 m)    There are no diagnoses linked to this encounter.  Donoven was seen today for chest and sinus congestion, cough & ha and ears are stopped up.  Diagnoses and all  orders for this visit:  Flu-like symptoms  Viral respiratory infection    I discussed the assessment and treatment plan with the patient. The patient was provided an opportunity to ask questions and all were answered. The patient agreed with the plan and demonstrated an understanding of the instructions.   The patient was advised to call back or seek an in-person evaluation if the symptoms worsen or if the condition fails to improve as anticipated.  I provided 20 minutes of non-face-to-face time during this encounter.  Horald Pollen, MD  Primary Care at [facility]

## 2020-06-20 ENCOUNTER — Telehealth: Payer: Self-pay

## 2020-06-20 NOTE — Telephone Encounter (Signed)
Pt. Called to let Dr. Mitchel Honour know that most of his symptoms from his telephone visit had gone away but he had some continued congestion, pt. Asked if there was anything that needed to be done or prescribed.

## 2020-06-23 ENCOUNTER — Ambulatory Visit: Payer: Self-pay

## 2020-06-23 NOTE — Telephone Encounter (Signed)
Patient called and says since his last virtual visit with Dr. Mitchel Honour on 06/17/20, his symptoms are better, but he still has nasal pressure, chest congestion with nothing coming up and ears stopped up. He denies fever. He says he had a COVID test with negative results. He says he's taking OTC mucinex, but it's not really helping. He asked is there anything else he can do? I advised I will send this to Dr. Mitchel Honour and someone will call him back within 24 hours with his recommendation, patient verbalized understanding.  Reason for Disposition . [1] Caller has NON-URGENT question (includes prescribed medication questions) AND [2] triager unable to answer  Answer Assessment - Initial Assessment Questions 1. MAIN CONCERN OR SYMPTOM:  "What is your main concern right now?" "What question do you have?" "What's the main symptom you're worried about?" (e.g., breathing difficulty, cough, fever. pain)     Ears stopped up, chest congestion, nasal pressure 2. ONSET: "When did the symptoms start?"     06/04/20 3. BETTER-SAME-WORSE: "Are you getting better, staying the same, or getting worse compared to how you felt at your last visit to the doctor (most recent medical visit)?"     Feel like it's better, but can't get the congestion up out of chest 4. VISIT DATE: "When were you seen?" (Date)     06/17/20 5. VISIT DOCTOR: "What is the name of the doctor taking care of you now?"     Dr. Mitchel Honour 6. VISIT DIAGNOSIS:  "What was the main symptom or problem that you were seen for?" "Were you given a diagnosis?"      Same as above. I don't know of a diagnosis 7. VISIT MEDICATIONS: "Did the physician order any new medicines for you to use?" If Yes, ask: "Have you filled the prescription and started taking the medicine?"      No, keep using the OTC medications 8. NEXT APPOINTMENT: "Have you scheduled a follow-up appointment with your doctor?"     No 9. PAIN: "Is there any pain?" If Yes, ask: "How bad is it?"  (Scale  1-10; or mild, moderate, severe)     No 10. FEVER: "Do you have a fever?" If Yes, ask: "What is it, how was it measured  and when did it start?"      No 11. OTHER SYMPTOMS: "Do you have any other symptoms?"      Sinus pressure but no pain  Protocols used: RECENT MEDICAL VISIT FOR ILLNESS FOLLOW-UP CALL-A-AH

## 2020-06-23 NOTE — Telephone Encounter (Signed)
Pt had virtual with you 06/17/2020 overall doing better but struggling with chest/ sinus congestion, has been taking OTC mucinex (had a negative COVID test) and has had some relief but not a lot should he come in for evaluation or try something else OTC?

## 2020-06-23 NOTE — Telephone Encounter (Signed)
He has a viral upper respiratory infection that needs to run its course.  Due to his hypertension he has to be careful with over-the-counter cold medications that include a decongestant.  Continue over-the-counter Mucinex.  May try NyQuil at nighttime.  If his condition gets worse or clinical picture changes he needs to be seen at an urgent care center.  Thanks.

## 2020-06-24 NOTE — Telephone Encounter (Signed)
Pt reports he is doing much better today after getting a good nights sleep last night and he will continue to monitor for changes in symptoms

## 2020-06-26 ENCOUNTER — Ambulatory Visit
Admission: RE | Admit: 2020-06-26 | Discharge: 2020-06-26 | Disposition: A | Discharge status: Home / Self Care | Payer: Medicare Other | Source: Ambulatory Visit | Attending: Orthopaedic Surgery | Admitting: Orthopaedic Surgery

## 2020-06-26 DIAGNOSIS — M25511 Pain in right shoulder: Secondary | ICD-10-CM

## 2020-06-27 NOTE — Progress Notes (Signed)
MRI report

## 2020-06-28 ENCOUNTER — Ambulatory Visit: Payer: Medicare Other | Admitting: Orthopaedic Surgery

## 2020-06-28 ENCOUNTER — Encounter: Payer: Self-pay | Admitting: Orthopaedic Surgery

## 2020-06-28 DIAGNOSIS — S46011D Strain of muscle(s) and tendon(s) of the rotator cuff of right shoulder, subsequent encounter: Secondary | ICD-10-CM | POA: Diagnosis not present

## 2020-06-28 NOTE — Progress Notes (Signed)
The patient comes in today to go over a MRI of his right shoulder.  He is 64 years old and well-known to me.  He is an avid Doctor, general practice.  A few weeks ago he did injure this right shoulder playing pickle ball.  He saw one of our partners who appropriately ordered MRI of the shoulder.  He still reports shoulder pain and weakness and problems with overhead activities.  On exam he still has weakness with abduction and external rotation of the right shoulder and pain throughout the arc of motion.  He uses more of his deltoids to abduct her shoulder.  MRI of his right shoulder does show a full-thickness tear of the rotator cuff with significant tendinosis of the cuff as well as some retraction.  I showed him a shoulder model and explained what this meant.  He has had shoulder surgery that we have performed in the past on his left shoulder so he is fully aware about what surgery is involved.  I described due to the risk and benefits of surgery and what to expect with his interoperative postoperative course.  At age 56 he understands that it will all depend on the nature of his fibers and then a long postoperative course in terms of rehabbing the shoulder.  He understands this can take 5 to 6 months to fully recover.  He has had no other acute change in medical status.  All questions and concerns were answered and addressed.  We will work on getting him scheduled in the near future for a right shoulder arthroscopy with debridement and subacromial decompression combined with attempted rotator cuff repair.

## 2020-07-06 ENCOUNTER — Telehealth: Payer: Self-pay

## 2020-07-06 NOTE — Telephone Encounter (Signed)
As long as he uses his shoulder and does move it occasionally throughout the day, he will not develop scar tissue if he waits for later to have surgery.  He will not make it harder on our end of things.  Certainly with waiting, a can slowly get worse but that usually takes many months for it to worsen.  He may want a wait until after furniture marked to have his surgery.  It is totally up to him.

## 2020-07-06 NOTE — Telephone Encounter (Signed)
Patient called in wanting to ask questions regarding surgery .

## 2020-07-06 NOTE — Telephone Encounter (Signed)
Patient has a few questions about his shoulder He is asking if he waits, will it get worse? Will he have scar tissue that will make it hard to move if he doesn't do surgery soon He has furniture market coming up, can he just do what he is able and lift, etc?

## 2020-07-07 NOTE — Telephone Encounter (Signed)
Patient aware of the below message from [facility]  

## 2020-07-21 ENCOUNTER — Inpatient Hospital Stay: Payer: Medicare Other | Admitting: Orthopaedic Surgery

## 2020-08-04 ENCOUNTER — Encounter: Payer: Medicare Other | Admitting: Emergency Medicine

## 2020-08-18 ENCOUNTER — Other Ambulatory Visit: Payer: Self-pay

## 2020-08-18 ENCOUNTER — Ambulatory Visit (INDEPENDENT_AMBULATORY_CARE_PROVIDER_SITE_OTHER): Payer: Medicare Other | Admitting: Emergency Medicine

## 2020-08-18 ENCOUNTER — Encounter: Payer: Self-pay | Admitting: Emergency Medicine

## 2020-08-18 VITALS — BP 156/99 | HR 71 | Temp 98.0°F | Resp 16 | Ht 67.0 in | Wt 216.0 lb

## 2020-08-18 DIAGNOSIS — Z20822 Contact with and (suspected) exposure to covid-19: Secondary | ICD-10-CM

## 2020-08-18 DIAGNOSIS — Z130 Encounter for screening for diseases of the blood and blood-forming organs and certain disorders involving the immune mechanism: Secondary | ICD-10-CM | POA: Diagnosis not present

## 2020-08-18 DIAGNOSIS — Z125 Encounter for screening for malignant neoplasm of prostate: Secondary | ICD-10-CM

## 2020-08-18 DIAGNOSIS — Z0001 Encounter for general adult medical examination with abnormal findings: Secondary | ICD-10-CM | POA: Diagnosis not present

## 2020-08-18 DIAGNOSIS — Z6833 Body mass index (BMI) 33.0-33.9, adult: Secondary | ICD-10-CM

## 2020-08-18 DIAGNOSIS — Z13228 Encounter for screening for other metabolic disorders: Secondary | ICD-10-CM

## 2020-08-18 DIAGNOSIS — Z1329 Encounter for screening for other suspected endocrine disorder: Secondary | ICD-10-CM | POA: Diagnosis not present

## 2020-08-18 DIAGNOSIS — Z13220 Encounter for screening for lipoid disorders: Secondary | ICD-10-CM

## 2020-08-18 DIAGNOSIS — Z0000 Encounter for general adult medical examination without abnormal findings: Secondary | ICD-10-CM

## 2020-08-18 DIAGNOSIS — I10 Essential (primary) hypertension: Secondary | ICD-10-CM

## 2020-08-18 MED ORDER — LISINOPRIL 20 MG PO TABS
20.0000 mg | ORAL_TABLET | Freq: Every day | ORAL | 3 refills | Status: DC
Start: 2020-08-18 — End: 2021-09-12

## 2020-08-18 MED ORDER — LISINOPRIL 20 MG PO TABS
20.0000 mg | ORAL_TABLET | Freq: Every day | ORAL | 3 refills | Status: DC
Start: 2020-08-18 — End: 2020-08-18

## 2020-08-18 NOTE — Patient Instructions (Addendum)
   If you have lab work done today you will be contacted with your lab results within the next 2 weeks.  If you have not heard from us then please contact us. The fastest way to get your results is to register for My Chart.   IF you received an x-ray today, you will receive an invoice from [facility]. Please contact [facility] at [phone] with questions or concerns regarding your invoice.   IF you received labwork today, you will receive an invoice from [facility]. Please contact [facility] at [phone] with questions or concerns regarding your invoice.   Our billing staff will not be able to assist you with questions regarding bills from these companies.  You will be contacted with the lab results as soon as they are available. The fastest way to get your results is to activate your My Chart account. Instructions are located on the last page of this paperwork. If you have not heard from us regarding the results in 2 weeks, please contact this office.      Health Maintenance, Male Adopting a healthy lifestyle and getting preventive care are important in promoting health and wellness. Ask your health care provider about:  The right schedule for you to have regular tests and exams.  Things you can do on your own to prevent diseases and keep yourself healthy. What should I know about diet, weight, and exercise? Eat a healthy diet   Eat a diet that includes plenty of vegetables, fruits, low-fat dairy products, and lean protein.  Do not eat a lot of foods that are high in solid fats, added sugars, or sodium. Maintain a healthy weight Body mass index (BMI) is a measurement that can be used to identify possible weight problems. It estimates body fat based on height and weight. Your health care provider can help determine your BMI and help you achieve or maintain a healthy weight. Get regular exercise Get regular exercise. This is one of the most important things you  can do for your health. Most adults should:  Exercise for at least 150 minutes each week. The exercise should increase your heart rate and make you sweat (moderate-intensity exercise).  Do strengthening exercises at least twice a week. This is in addition to the moderate-intensity exercise.  Spend less time sitting. Even light physical activity can be beneficial. Watch cholesterol and blood lipids Have your blood tested for lipids and cholesterol at 65 years of age, then have this test every 5 years. You may need to have your cholesterol levels checked more often if:  Your lipid or cholesterol levels are high.  You are older than 65 years of age.  You are at high risk for heart disease. What should I know about cancer screening? Many types of cancers can be detected early and may often be prevented. Depending on your health history and family history, you may need to have cancer screening at various ages. This may include screening for:  Colorectal cancer.  Prostate cancer.  Skin cancer.  Lung cancer. What should I know about heart disease, diabetes, and high blood pressure? Blood pressure and heart disease  High blood pressure causes heart disease and increases the risk of stroke. This is more likely to develop in people who have high blood pressure readings, are of African descent, or are overweight.  Talk with your health care provider about your target blood pressure readings.  Have your blood pressure checked: ? Every 3-5 years if you are 18-39   years of age. ? Every year if you are 40 years old or older.  If you are between the ages of 65 and 75 and are a current or former smoker, ask your health care provider if you should have a one-time screening for abdominal aortic aneurysm (AAA). Diabetes Have regular diabetes screenings. This checks your fasting blood sugar level. Have the screening done:  Once every three years after age 45 if you are at a normal weight and have  a low risk for diabetes.  More often and at a younger age if you are overweight or have a high risk for diabetes. What should I know about preventing infection? Hepatitis B If you have a higher risk for hepatitis B, you should be screened for this virus. Talk with your health care provider to find out if you are at risk for hepatitis B infection. Hepatitis C Blood testing is recommended for:  Everyone born from 1945 through 1965.  Anyone with known risk factors for hepatitis C. Sexually transmitted infections (STIs)  You should be screened each year for STIs, including gonorrhea and chlamydia, if: ? You are sexually active and are younger than 65 years of age. ? You are older than 65 years of age and your health care provider tells you that you are at risk for this type of infection. ? Your sexual activity has changed since you were last screened, and you are at increased risk for chlamydia or gonorrhea. Ask your health care provider if you are at risk.  Ask your health care provider about whether you are at high risk for HIV. Your health care provider may recommend a prescription medicine to help prevent HIV infection. If you choose to take medicine to prevent HIV, you should first get tested for HIV. You should then be tested every 3 months for as long as you are taking the medicine. Follow these instructions at home: Lifestyle  Do not use any products that contain nicotine or tobacco, such as cigarettes, e-cigarettes, and chewing tobacco. If you need help quitting, ask your health care provider.  Do not use street drugs.  Do not share needles.  Ask your health care provider for help if you need support or information about quitting drugs. Alcohol use  Do not drink alcohol if your health care provider tells you not to drink.  If you drink alcohol: ? Limit how much you have to 0-2 drinks a day. ? Be aware of how much alcohol is in your drink. In the [place] one drink equals one 12  oz bottle of beer (355 mL), one 5 oz glass of wine (148 mL), or one 1 oz glass of hard liquor (44 mL). General instructions  Schedule regular health, dental, and eye exams.  Stay current with your vaccines.  Tell your health care provider if: ? You often feel depressed. ? You have ever been abused or do not feel safe at home. Summary  Adopting a healthy lifestyle and getting preventive care are important in promoting health and wellness.  Follow your health care provider's instructions about healthy diet, exercising, and getting tested or screened for diseases.  Follow your health care provider's instructions on monitoring your cholesterol and blood pressure. This information is not intended to replace advice given to you by your health care provider. Make sure you discuss any questions you have with your health care provider. Document Revised: 10/22/2018 Document Reviewed: 10/22/2018 [employer] Patient Education  2020 [employer]  

## 2020-08-18 NOTE — Progress Notes (Signed)
Matthew [name] 64 y.o.   Chief Complaint  Patient presents with  . Annual Exam    HISTORY OF PRESENT ILLNESS: This is a 64 y.o. male here for annual exam. Has history of hypertension on lisinopril 20 mg daily. Has no complaints or medical concerns today. No previous Covid infection and no vaccine. Has history of "bad joints" with multiple surgeries to both knees, hip, shoulder.  Scheduled for right shoulder rotator cuff surgery next month.  HPI   Prior to Admission medications   Medication Sig Start Date End Date Taking? Authorizing Provider  ALPRAZolam Duanne Moron) 1 MG tablet Take 1 tablet (1 mg total) by mouth at bedtime as needed for sleep. 06/03/20  Yes Leandrew Koyanagi, MD  Coenzyme Q10 (COQ10 PO) Take 300 mg by mouth daily.   Yes [provider]  Glucosamine-Chondroitin-MSM (GLUCOSAMINE CHONDROIT MSM DS) TABS Take 1,500 mg by mouth daily.   Yes [provider]  latanoprost (XALATAN) 0.005 % ophthalmic solution Place 1 drop into both eyes at bedtime.   Yes [provider]  lisinopril (PRINIVIL,ZESTRIL) 20 MG tablet Take 20 mg by mouth daily.     Yes [provider]  OVER THE COUNTER MEDICATION    Yes [provider]  VITAMIN D PO Take by mouth daily.   Yes [provider]  aspirin EC 325 MG EC tablet Take 1 tablet (325 mg total) by mouth 2 (two) times daily after a meal. Patient not taking: Reported on 12/24/2019 09/10/15   Mcarthur Rossetti, MD  celecoxib (CELEBREX) 200 MG capsule Take 1 capsule (200 mg total) by mouth 2 (two) times daily between meals as needed. Patient not taking: Reported on 12/24/2019 02/24/19   Mcarthur Rossetti, MD  diazepam (VALIUM) 5 MG tablet TAKE ONE TAB ONE HOUR PRIOR TO MRI REPEAT AS NEEDED #2 . ZERO REFILLS Patient not taking: Reported on 08/18/2020 03/16/19   Pete Pelt, PA-C  hydrochlorothiazide (HYDRODIURIL) 25 MG tablet Take 25 mg by mouth daily. Patient not taking: Reported on  08/18/2020    [provider]  HYDROcodone-acetaminophen (NORCO/VICODIN) 5-325 MG tablet TAKE 1 OR 2 TABLETS BY MOUTH EVERY 6 HOURS AS NEEDED FOR MODERATE PAIN Patient not taking: Reported on 08/18/2020 05/21/19   Mcarthur Rossetti, MD  ketorolac (ACULAR) 0.5 % ophthalmic solution Place 1 drop into the right eye 4 (four) times daily. Patient not taking: Reported on 08/18/2020 02/01/20   [provider]  methocarbamol (ROBAXIN) 500 MG tablet Take 1 tablet (500 mg total) by mouth every 6 (six) hours as needed for muscle spasms. Patient not taking: Reported on 06/03/2020 06/19/19   Kristeen Miss, MD  oxyCODONE-acetaminophen (PERCOCET/ROXICET) 5-325 MG tablet Take 1-2 tablets by mouth every 4 (four) hours as needed for severe pain. Patient not taking: Reported on 12/24/2019 06/19/19   Kristeen Miss, MD    No Known Allergies  Patient Active Problem List   Diagnosis Date Noted  . Right epiretinal membrane 03/14/2020  . Cystoid macular edema of right eye 03/14/2020  . Nuclear sclerotic cataract of left eye 03/14/2020  . Pseudophakia 03/14/2020  . Snoring 03/14/2020  . Essential hypertension 12/24/2019  . Lumbar stenosis with neurogenic claudication 06/18/2019  . Lumbar facet arthropathy 10/30/2017  . Osteoarthritis of right hip 09/09/2015  . Status post total replacement of right hip 09/09/2015    Past Medical History:  Diagnosis Date  . Anxiety   . Arthritis    KNEES BACK SHOULDERS  . Cancer ([place])  HX SKIN CANCER  . Cataracts, bilateral   . Heart murmur    "nothing to be concerned with, I ve had it all my life."  . Hx of nonmelanoma skin cancer   . Hypertension    FOLLOWED BY DR ED GREEN    Past Surgical History:  Procedure Laterality Date  . ANTERIOR LAT LUMBAR FUSION Left 06/18/2019   Procedure: Left Lumbar three-four Anterolateral lumbar decompression/interbody fusion with lateral plate fixation;  Surgeon: Kristeen Miss, MD;  Location: [facility];  Service:  Neurosurgery;  Laterality: Left;  . BACK SURGERY  2010   L5S1 FUSION  . COLONOSCOPY  05/13/2019  . EYE SURGERY  10/2019   per patient retina  . JOINT REPLACEMENT N/A    [employer] 08/16/2020  . KNEE ARTHROPLASTY  2010   L  . KNEE ARTHROSCOPY     L X 2  . REPLACEMENT TOTAL KNEE Left 11/18/2008  . SHOULDER ARTHROSCOPY Left 05/14/2019  . SPINE SURGERY N/A    [employer] 08/16/2020  . TOTAL HIP ARTHROPLASTY Right 09/09/2015   Procedure: RIGHT TOTAL HIP ARTHROPLASTY ANTERIOR APPROACH;  Surgeon: Mcarthur Rossetti, MD;  Location: [facility];  Service: Orthopedics;  Laterality: Right;  . TOTAL KNEE ARTHROPLASTY  10/08/2011   Procedure: rightTOTAL KNEE ARTHROPLASTY;  Surgeon: Rudean Haskell, MD;  Location: [facility];  Service: Orthopedics;  Laterality: Right;  TOTAL RIGHT KNEE ARTHROPLASTY     Social History   Socioeconomic History  . Marital status: Divorced    Spouse name: Not on file  . Number of children: Not on file  . Years of education: Not on file  . Highest education level: Not on file  Occupational History  . Not on file  Tobacco Use  . Smoking status: Never Smoker  . Smokeless tobacco: Never Used  Vaping Use  . Vaping Use: Never used  Substance and Sexual Activity  . Alcohol use: Yes    Alcohol/week: 2.0 standard drinks    Types: 2 Cans of beer per week    Comment: OCCASIONAL-wine, beer,mixed  . Drug use: No  . Sexual activity: Not on file  Other Topics Concern  . Not on file  Social History Narrative  . Not on file   Social Determinants of Health   Financial Resource Strain:   . Difficulty of Paying Living Expenses: Not on file  Food Insecurity:   . Worried About Charity fundraiser in the Last Year: Not on file  . Ran Out of Food in the Last Year: Not on file  Transportation Needs:   . Lack of Transportation (Medical): Not on file  . Lack of Transportation (Non-Medical): Not on file  Physical Activity:   . Days of Exercise per Week: Not on file  . Minutes of  Exercise per Session: Not on file  Stress:   . Feeling of Stress : Not on file  Social Connections:   . Frequency of Communication with Friends and Family: Not on file  . Frequency of Social Gatherings with Friends and Family: Not on file  . Attends Religious Services: Not on file  . Active Member of Clubs or Organizations: Not on file  . Attends Archivist Meetings: Not on file  . Marital Status: Not on file  Intimate Partner Violence:   . Fear of Current or Ex-Partner: Not on file  . Emotionally Abused: Not on file  . Physically Abused: Not on file  . Sexually Abused: Not on file    Family History  Problem Relation Age of Onset  . Heart attack Brother        30  . Hypertension Brother   . Stroke Brother   . Heart attack Paternal Grandfather        5 bypass surgeries  . Prostate cancer Paternal Grandfather   . Cancer Father        died of bladder cancer, prostate  . Cancer Maternal Grandmother   . Hypertension Brother      Review of Systems  Constitutional: Negative.  Negative for chills and fever.  HENT: Negative.  Negative for congestion and sore throat.   Respiratory: Negative.  Negative for cough and shortness of breath.   Cardiovascular: Negative.  Negative for chest pain and palpitations.  Gastrointestinal: Negative.  Negative for abdominal pain, diarrhea, nausea and vomiting.  Genitourinary: Negative.  Negative for dysuria and hematuria.  Musculoskeletal: Negative.  Negative for myalgias and neck pain.  Skin: Negative.  Negative for rash.  Neurological: Negative.  Negative for dizziness and headaches.  All other systems reviewed and are negative.  Today's Vitals   08/18/20 0940  BP: (!) 156/99  Pulse: 71  Resp: 16  Temp: 98 F (36.7 C)  TempSrc: Temporal  SpO2: 96%  Weight: 216 lb (98 kg)  Height: 5\' 7"  (1.702 m)   Body mass index is 33.83 kg/m.   Physical Exam Vitals reviewed.  Constitutional:      Appearance: Normal appearance.    HENT:     Head: Normocephalic.     Mouth/Throat:     Mouth: Mucous membranes are moist.     Pharynx: Oropharynx is clear.  Eyes:     Extraocular Movements: Extraocular movements intact.     Conjunctiva/sclera: Conjunctivae normal.     Pupils: Pupils are equal, round, and reactive to light.  Neck:     Vascular: No carotid bruit.  Cardiovascular:     Rate and Rhythm: Normal rate and regular rhythm.     Pulses: Normal pulses.     Heart sounds: Normal heart sounds.  Pulmonary:     Effort: Pulmonary effort is normal.     Breath sounds: Normal breath sounds.  Abdominal:     General: Bowel sounds are normal. There is no distension.     Palpations: Abdomen is soft.     Tenderness: There is no abdominal tenderness. There is no right CVA tenderness or left CVA tenderness.  Musculoskeletal:        General: Normal range of motion.     Cervical back: Normal range of motion and neck supple. No tenderness.  Lymphadenopathy:     Cervical: No cervical adenopathy.  Skin:    General: Skin is warm and dry.     Capillary Refill: Capillary refill takes less than 2 seconds.  Neurological:     General: No focal deficit present.     Mental Status: He is alert and oriented to person, place, and time.  Psychiatric:        Mood and Affect: Mood normal.        Behavior: Behavior normal.      ASSESSMENT & PLAN: Tyr was seen today for annual exam.  Diagnoses and all orders for this visit:  Routine general medical examination at a health care facility  Screening for deficiency anemia -     CBC with Differential/Platelet  Screening for lipoid disorders -     Lipid panel  Screening for endocrine, metabolic and immunity disorder -     Hemoglobin A1c  Essential hypertension -     Comprehensive metabolic panel  Exposure to COVID-19 virus -     SAR CoV2 Serology (COVID 19)AB(IGG)IA  Prostate cancer screening -     PSA  Other orders -     lisinopril (ZESTRIL) 20 MG tablet; Take 1  tablet (20 mg total) by mouth daily.    Patient Instructions       If you have lab work done today you will be contacted with your lab results within the next 2 weeks.  If you have not heard from [place] then please contact us. The fastest way to get your results is to register for My Chart.   IF you received an x-ray today, you will receive an invoice from [facility]. Please contact [facility] at [phone] with questions or concerns regarding your invoice.   IF you received labwork today, you will receive an invoice from [place] [facility]. Please contact [facility] at [phone] with questions or concerns regarding your invoice.   Our billing staff will not be able to assist you with questions regarding bills from these companies.  You will be contacted with the lab results as soon as they are available. The fastest way to get your results is to activate your My Chart account. Instructions are located on the last page of this paperwork. If you have not heard from [place] regarding the results in 2 weeks, please contact this office.      Health Maintenance, Male Adopting a healthy lifestyle and getting preventive care are important in promoting health and wellness. Ask your health care provider about:  The right schedule for you to have regular tests and exams.  Things you can do on your own to prevent diseases and keep yourself healthy. What should I know about diet, weight, and exercise? Eat a healthy diet   Eat a diet that includes plenty of vegetables, fruits, low-fat dairy products, and lean protein.  Do not eat a lot of foods that are high in solid fats, added sugars, or sodium. Maintain a healthy weight Body mass index (BMI) is a measurement that can be used to identify possible weight problems. It estimates body fat based on height and weight. Your health care provider can help determine your BMI and help you achieve or maintain a healthy weight. Get regular  exercise Get regular exercise. This is one of the most important things you can do for your health. Most adults should:  Exercise for at least 150 minutes each week. The exercise should increase your heart rate and make you sweat (moderate-intensity exercise).  Do strengthening exercises at least twice a week. This is in addition to the moderate-intensity exercise.  Spend less time sitting. Even light physical activity can be beneficial. Watch cholesterol and blood lipids Have your blood tested for lipids and cholesterol at 65 years of age, then have this test every 5 years. You may need to have your cholesterol levels checked more often if:  Your lipid or cholesterol levels are high.  You are older than 65 years of age.  You are at high risk for heart disease. What should I know about cancer screening? Many types of cancers can be detected early and may often be prevented. Depending on your health history and family history, you may need to have cancer screening at various ages. This may include screening for:  Colorectal cancer.  Prostate cancer.  Skin cancer.  Lung cancer. What should I know about heart disease, diabetes, and high blood pressure?  Blood pressure and heart disease  High blood pressure causes heart disease and increases the risk of stroke. This is more likely to develop in people who have high blood pressure readings, are of African descent, or are overweight.  Talk with your health care provider about your target blood pressure readings.  Have your blood pressure checked: ? Every 3-5 years if you are 61-26 years of age. ? Every year if you are 38 years old or older.  If you are between the ages of 2 and 61 and are a current or former smoker, ask your health care provider if you should have a one-time screening for abdominal aortic aneurysm (AAA). Diabetes Have regular diabetes screenings. This checks your fasting blood sugar level. Have the screening  done:  Once every three years after age 30 if you are at a normal weight and have a low risk for diabetes.  More often and at a younger age if you are overweight or have a high risk for diabetes. What should I know about preventing infection? Hepatitis B If you have a higher risk for hepatitis B, you should be screened for this virus. Talk with your health care provider to find out if you are at risk for hepatitis B infection. Hepatitis C Blood testing is recommended for:  Everyone born from [birth year] through 1966.  Anyone with known risk factors for hepatitis C. Sexually transmitted infections (STIs)  You should be screened each year for STIs, including gonorrhea and chlamydia, if: ? You are sexually active and are younger than 65 years of age. ? You are older than 65 years of age and your health care provider tells you that you are at risk for this type of infection. ? Your sexual activity has changed since you were last screened, and you are at increased risk for chlamydia or gonorrhea. Ask your health care provider if you are at risk.  Ask your health care provider about whether you are at high risk for HIV. Your health care provider may recommend a prescription medicine to help prevent HIV infection. If you choose to take medicine to prevent HIV, you should first get tested for HIV. You should then be tested every 3 months for as long as you are taking the medicine. Follow these instructions at home: Lifestyle  Do not use any products that contain nicotine or tobacco, such as cigarettes, e-cigarettes, and chewing tobacco. If you need help quitting, ask your health care provider.  Do not use street drugs.  Do not share needles.  Ask your health care provider for help if you need support or information about quitting drugs. Alcohol use  Do not drink alcohol if your health care provider tells you not to drink.  If you drink alcohol: ? Limit how much you have to 0-2 drinks a  day. ? Be aware of how much alcohol is in your drink. In the [place] one drink equals one 12 oz bottle of beer (355 mL), one 5 oz glass of wine (148 mL), or one 1 oz glass of hard liquor (44 mL). General instructions  Schedule regular health, dental, and eye exams.  Stay current with your vaccines.  Tell your health care provider if: ? You often feel depressed. ? You have ever been abused or do not feel safe at home. Summary  Adopting a healthy lifestyle and getting preventive care are important in promoting health and wellness.  Follow your health care provider's instructions about healthy diet, exercising, and getting tested  or screened for diseases.  Follow your health care provider's instructions on monitoring your cholesterol and blood pressure. This information is not intended to replace advice given to you by your health care provider. Make sure you discuss any questions you have with your health care provider. Document Revised: 10/22/2018 Document Reviewed: 10/22/2018 Elsevier Patient Education  2020 Elsevier Inc.      Agustina Caroli, MD [facility]

## 2020-08-19 ENCOUNTER — Other Ambulatory Visit: Payer: Self-pay | Admitting: Emergency Medicine

## 2020-08-19 DIAGNOSIS — R972 Elevated prostate specific antigen [PSA]: Secondary | ICD-10-CM

## 2020-08-19 LAB — CBC WITH DIFFERENTIAL/PLATELET
Basophils Absolute: 0 10*3/uL (ref 0.0–0.2)
Basos: 1 %
EOS (ABSOLUTE): 0.1 10*3/uL (ref 0.0–0.4)
Eos: 2 %
Hematocrit: 51.9 % — ABNORMAL HIGH (ref 37.5–51.0)
Hemoglobin: 17.4 g/dL (ref 13.0–17.7)
Immature Grans (Abs): 0 10*3/uL (ref 0.0–0.1)
Immature Granulocytes: 0 %
Lymphocytes Absolute: 1 10*3/uL (ref 0.7–3.1)
Lymphs: 26 %
MCH: 31.4 pg (ref 26.6–33.0)
MCHC: 33.5 g/dL (ref 31.5–35.7)
MCV: 94 fL (ref 79–97)
Monocytes Absolute: 0.3 10*3/uL (ref 0.1–0.9)
Monocytes: 8 %
Neutrophils Absolute: 2.6 10*3/uL (ref 1.4–7.0)
Neutrophils: 63 %
Platelets: 228 10*3/uL (ref 150–450)
RBC: 5.55 x10E6/uL (ref 4.14–5.80)
RDW: 12.7 % (ref 11.6–15.4)
WBC: 4 10*3/uL (ref 3.4–10.8)

## 2020-08-19 LAB — LIPID PANEL
Chol/HDL Ratio: 3.9 ratio (ref 0.0–5.0)
Cholesterol, Total: 212 mg/dL — ABNORMAL HIGH (ref 100–199)
HDL: 55 mg/dL (ref >39)
LDL Chol Calc (NIH): 143 mg/dL — ABNORMAL HIGH (ref 0–99)
Triglycerides: 77 mg/dL (ref 0–149)
VLDL Cholesterol Cal: 14 mg/dL (ref 5–40)

## 2020-08-19 LAB — SAR COV2 SEROLOGY (COVID19)AB(IGG),IA: DiaSorin SARS-CoV-2 Ab, IgG: NEGATIVE

## 2020-08-19 LAB — COMPREHENSIVE METABOLIC PANEL
ALT: 14 IU/L (ref 0–44)
AST: 17 IU/L (ref 0–40)
Albumin/Globulin Ratio: 2.2 (ref 1.2–2.2)
Albumin: 5 g/dL — ABNORMAL HIGH (ref 3.8–4.8)
Alkaline Phosphatase: 73 IU/L (ref 44–121)
BUN/Creatinine Ratio: 18 (ref 10–24)
BUN: 17 mg/dL (ref 8–27)
Bilirubin Total: 0.4 mg/dL (ref 0.0–1.2)
CO2: 23 mmol/L (ref 20–29)
Calcium: 9.6 mg/dL (ref 8.6–10.2)
Chloride: 102 mmol/L (ref 96–106)
Creatinine, Ser: 0.95 mg/dL (ref 0.76–1.27)
GFR calc Af Amer: 97 mL/min/{1.73_m2} (ref >59)
GFR calc non Af Amer: 84 mL/min/{1.73_m2} (ref >59)
Globulin, Total: 2.3 g/dL (ref 1.5–4.5)
Glucose: 104 mg/dL — ABNORMAL HIGH (ref 65–99)
Potassium: 4.6 mmol/L (ref 3.5–5.2)
Sodium: 138 mmol/L (ref 134–144)
Total Protein: 7.3 g/dL (ref 6.0–8.5)

## 2020-08-19 LAB — HEMOGLOBIN A1C
Est. average glucose Bld gHb Est-mCnc: 117 mg/dL
Hgb A1c MFr Bld: 5.7 % — ABNORMAL HIGH (ref 4.8–5.6)

## 2020-08-19 LAB — PSA: Prostate Specific Ag, Serum: 6.5 ng/mL — ABNORMAL HIGH (ref 0.0–4.0)

## 2020-08-22 ENCOUNTER — Encounter: Payer: Self-pay | Admitting: Emergency Medicine

## 2020-08-22 DIAGNOSIS — R972 Elevated prostate specific antigen [PSA]: Secondary | ICD-10-CM

## 2020-08-31 ENCOUNTER — Other Ambulatory Visit: Payer: Self-pay | Admitting: Emergency Medicine

## 2020-09-01 LAB — PSA, TOTAL AND FREE
PSA, Free Pct: 16.1 %
PSA, Free: 0.58 ng/mL
Prostate Specific Ag, Serum: 3.6 ng/mL (ref 0.0–4.0)

## 2020-09-15 ENCOUNTER — Encounter (INDEPENDENT_AMBULATORY_CARE_PROVIDER_SITE_OTHER): Payer: Medicare Other | Admitting: Ophthalmology

## 2020-09-15 ENCOUNTER — Other Ambulatory Visit: Payer: Self-pay | Admitting: Orthopaedic Surgery

## 2020-09-15 DIAGNOSIS — S46011D Strain of muscle(s) and tendon(s) of the rotator cuff of right shoulder, subsequent encounter: Secondary | ICD-10-CM

## 2020-09-15 MED ORDER — OXYCODONE HCL 5 MG PO TABS: 5.0000 mg | ORAL_TABLET | ORAL | 0 refills | Status: DC | PRN

## 2020-09-22 ENCOUNTER — Ambulatory Visit (INDEPENDENT_AMBULATORY_CARE_PROVIDER_SITE_OTHER): Payer: Medicare Other | Admitting: Orthopaedic Surgery

## 2020-09-22 ENCOUNTER — Encounter: Payer: Self-pay | Admitting: Orthopaedic Surgery

## 2020-09-22 DIAGNOSIS — Z9889 Other specified postprocedural states: Secondary | ICD-10-CM

## 2020-09-22 MED ORDER — OXYCODONE HCL 5 MG PO TABS
5.0000 mg | ORAL_TABLET | Freq: Four times a day (QID) | ORAL | 0 refills | Status: DC | PRN
Start: 2020-09-22 — End: 2020-12-28

## 2020-09-22 NOTE — Progress Notes (Signed)
The patient is 1 week status post a right shoulder arthroscopy with a rotator cuff repair of the supraspinatus tendon.  He is 64 years old.  Fortunately anterior was not significantly retracted and I was able to bring the tendon back to resting position.  He has been compliant with his sling.  He understands that we do need to set him up for outpatient physical therapy for the right shoulder.    Examination of his right shoulder shows the axillary nerve is intact.  Removed all the sutures from his shoulder incisions.  I did show him his arthroscopy pictures to show him the extent of his repair.  We do need to set him up for outpatient physical therapy.  This [facility] be done at [facility].  He understands that he is to wear the sling for at least 3 more weeks with limiting his shoulder abduction and external rotation except through therapy.  All questions and concerns were answered addressed.  I will refill his oxycodone.  We will see him back in 4 weeks to see how he is doing overall.

## 2020-09-27 ENCOUNTER — Other Ambulatory Visit: Payer: Self-pay

## 2020-09-27 DIAGNOSIS — Z9889 Other specified postprocedural states: Secondary | ICD-10-CM

## 2020-10-03 ENCOUNTER — Encounter: Payer: Self-pay | Admitting: Emergency Medicine

## 2020-10-03 NOTE — Telephone Encounter (Signed)
Pt has not yet been contacted by Urology

## 2020-10-04 ENCOUNTER — Ambulatory Visit: Payer: Medicare Other | Attending: Orthopaedic Surgery | Admitting: Physical Therapy

## 2020-10-04 ENCOUNTER — Other Ambulatory Visit: Payer: Self-pay

## 2020-10-04 ENCOUNTER — Encounter: Payer: Self-pay | Admitting: Physical Therapy

## 2020-10-04 DIAGNOSIS — M25511 Pain in right shoulder: Secondary | ICD-10-CM | POA: Diagnosis not present

## 2020-10-04 DIAGNOSIS — M6281 Muscle weakness (generalized): Secondary | ICD-10-CM | POA: Insufficient documentation

## 2020-10-04 DIAGNOSIS — M25611 Stiffness of right shoulder, not elsewhere classified: Secondary | ICD-10-CM | POA: Diagnosis present

## 2020-10-04 NOTE — Patient Instructions (Signed)
    Home exercise program created by Anvi Mangal, PT.  For questions, please contact Georg Ang via phone at [phone] or email at [email or web address]  [facility] [place] [address]  [address] [place], [place], [address] Phone: [phone]   Fax:  [phone]    

## 2020-10-04 NOTE — Therapy (Signed)
[facility] [address]  [place], [place], [address] Phone: [phone]   Fax:  [phone]  Physical Therapy Evaluation  Patient Details  Name: Matthew [name] MRN: [id number] Date of Birth: 1956-01-29 Referring Provider (PT): Mcarthur Rossetti, MD   Encounter Date: 10/04/2020   PT End of Session - 10/04/20 0853    Visit Number 1    Number of Visits 24    Date for PT Re-Evaluation 12/27/20    Authorization Type U[employer] Medicare    PT Start Time 0853   Pt arrived late   PT Stop Time 0941    PT Time Calculation (min) 48 min    Activity Tolerance Patient tolerated treatment well    Behavior During Therapy [facility] for tasks assessed/performed           Past Medical History:  Diagnosis Date  . Anxiety   . Arthritis    KNEES BACK SHOULDERS  . Cancer (HCC)    HX SKIN CANCER  . Cataracts, bilateral   . Heart murmur    "nothing to be concerned with, I ve had it all my life."  . Hx of nonmelanoma skin cancer   . Hypertension    FOLLOWED BY DR ED GREEN    Past Surgical History:  Procedure Laterality Date  . ANTERIOR LAT LUMBAR FUSION Left 06/18/2019   Procedure: Left Lumbar three-four Anterolateral lumbar decompression/interbody fusion with lateral plate fixation;  Surgeon: Kristeen Miss, MD;  Location: [facility];  Service: Neurosurgery;  Laterality: Left;  . BACK SURGERY  2010   L5S1 FUSION  . COLONOSCOPY  05/13/2019  . EYE SURGERY  10/2019   per patient retina  . JOINT REPLACEMENT N/A    Phreesia 08/16/2020  . KNEE ARTHROPLASTY  2010   L  . KNEE ARTHROSCOPY     L X 2  . REPLACEMENT TOTAL KNEE Left 11/18/2008  . SHOULDER ARTHROSCOPY Left 05/14/2019  . SPINE SURGERY N/A    Phreesia 08/16/2020  . TOTAL HIP ARTHROPLASTY Right 09/09/2015   Procedure: RIGHT TOTAL HIP ARTHROPLASTY ANTERIOR APPROACH;  Surgeon: Mcarthur Rossetti, MD;  Location: [facility];  Service: Orthopedics;  Laterality: Right;  . TOTAL  KNEE ARTHROPLASTY  10/08/2011   Procedure: rightTOTAL KNEE ARTHROPLASTY;  Surgeon: Rudean Haskell, MD;  Location: [facility];  Service: Orthopedics;  Laterality: Right;  TOTAL RIGHT KNEE ARTHROPLASTY     There were no vitals filed for this visit.    Subjective Assessment - 10/04/20 0855    Subjective Pt arrives to PT w/o sling. He reports he wore it to sleep for the first few nights but has not been wearing day or night recently.Pain typically only when he tries to move his R arm.    Pertinent History R RCR 09/15/20; L RCR 05/14/19, HTN, hx skin CA, anxiety, TKA 2012, R THA 2016, L TKA 2010, L5-S1 fusion 2010    Limitations Lifting;House hold activities    Patient Stated Goals "get back to playing pickleball"    Currently in Pain? Yes    Pain Score 3     Pain Location Shoulder    Pain Orientation Right;Upper    Pain Descriptors / Indicators Sharp;Dull;Throbbing    Pain Type Surgical pain;Acute pain    Pain Radiating Towards n/a    Pain Frequency Intermittent    Aggravating Factors  moving arm    Pain Relieving Factors ice, ibuprofen  [facility] PT Assessment - 10/04/20 0853      Assessment   Medical Diagnosis R shoulder RCR - supraspinatus tendon    Referring Provider (PT) Mcarthur Rossetti, MD    Onset Date/Surgical Date 09/15/20    Hand Dominance Right    Next MD Visit 10/20/20    Prior Therapy PT s/p L RCR in 2020      Precautions   Precautions Shoulder    Shoulder Interventions Shoulder sling/immobilizer;At all times   pt not wearing sling as instructed x 3 wks as of 09/22/20     Balance Screen   Has the patient fallen in the past 6 months Yes    How many times? 1   at time of injury while playing pickleball   Has the patient had a decrease in activity level because of a fear of falling?  No    Is the patient reluctant to leave their home because of a fear of falling?  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    Leisure golf, pickleball, yardwork      Cognition   Overall Cognitive Status Within Functional Limits for tasks assessed      Observation/Other Assessments   Focus on Therapeutic Outcomes (FOTO)  Shoulder - 34% (66% limitation); Predicted 69% (31% limitation)      Posture/Postural Control   Posture/Postural Control Postural limitations    Postural Limitations Forward head;Rounded Shoulders      ROM / Strength   AROM / PROM / Strength PROM      PROM   PROM Assessment Site Shoulder    Right/Left Shoulder Right    Right Shoulder Flexion 84 Degrees    Right Shoulder ABduction 70 Degrees    Right Shoulder Internal Rotation 49 Degrees    Right Shoulder External Rotation 25 Degrees                      Objective measurements completed on examination: See above findings.       [facility] Adult PT Treatment/Exercise - 10/04/20 0853      Exercises   Exercises Shoulder      Shoulder Exercises: Seated   Retraction Both;10 reps;AROM;Strengthening   5" hold   Flexion Right;10 reps;PROM;AAROM    Flexion Limitations table slides    Abduction Right;10 reps;PROM;AAROM    ABduction Limitations scaption table slides    Other Seated Exercises B shoulder rolls x 10      Shoulder Exercises: ROM/Strengthening   Pendulum R shoulder fwd/back, side/side, CW/CCW x 20 each      Modalities   Modalities Vasopneumatic      Vasopneumatic   Number Minutes Vasopneumatic  10 minutes    Vasopnuematic Location  Shoulder   R   Vasopneumatic Pressure Low    Vasopneumatic Temperature  34                  PT Education - 10/04/20 0930    Education Details PT eval findings, anticipated POC, need for continued use of sling & initial HEP    Person(s) Educated Patient    Methods Explanation;Demonstration;Verbal cues;Handout    Comprehension Verbalized  understanding;Verbal cues required;Returned demonstration;Need further instruction            PT Short Term Goals -  10/04/20 0931      PT SHORT TERM GOAL #1   Title Patient will be independent with initial HEP    Status New    Target Date 11/01/20      PT SHORT TERM GOAL #2   Title Patient to improve R shoulder PROM to [facility] without pain provocation    Status New    Target Date 11/15/20             PT Long Term Goals - 10/04/20 0931      PT LONG TERM GOAL #1   Title Patient will be independent with ongoing/advanced HEP for self-management at home    Status New    Target Date 12/27/20      PT LONG TERM GOAL #2   Title Patient to improve R shoulder AROM to [facility] without pain provocation    Status New    Target Date 12/27/20      PT LONG TERM GOAL #3   Title Patient will demonstrate improved R shoulder strength to >/= 4/5 to 4+/5 for functional UE use    Status New    Target Date 12/27/20      PT LONG TERM GOAL #4   Title Patient to demonstrate R shoulder elevation to overhead shelf with 5# with good scapular mechanics and no evidence of compensations    Status New    Target Date 12/27/20      PT LONG TERM GOAL #5   Title Patient to report ability to perform ADLs, household, and work-related tasks without limitation due to R shoulder pain, LOM or weakness    Status New    Target Date 12/27/20                  Plan - 10/04/20 0930    Clinical Impression Statement Jyden is a 64 y/o male who presents to OP PT 19 days s/p R shoulder arthroscopy for RTC repair of the supraspinatus tendon on 09/15/20. He admits to having fully weaned from the sling despite MD instruction to continue sling for at least 3 weeks as of MD visit on 09/22/20. Educated patient on post-surgical precautions and sling use for proper healing. He reports he has been performing pendulum exercises and occasional distal joint arm exercises. Deficits include pain, abnormal posture, decreased L shoulder  A/PROM and pronounced R shoulder weakness. Pain, current limitations per RTC repair protocol, limited ROM and weakness create difficulty completing self-care including bathing and grooming as well as other ADLs and daily tasks. Reviewed proper technique for pendulum and distal joint ROM exercises and introduced scap retraction and PROM table slides - patient reported understanding of all education. Carvel will benefit from skilled PT per RTC repair protocol to address above deficits and restore functional L shoulder ROM and strength to allow him to resume normal daily activities without pain interference.    Personal Factors and Comorbidities Age;Behavior Pattern;Time since onset of injury/illness/exacerbation;Comorbidity 3+;Past/Current Experience    Comorbidities R RCR 09/15/20; L RCR 05/14/19, HTN, hx skin CA, anxiety, TKA 2012, R THA 2016, L TKA 2010, L5-S1 fusion 2010    Examination-Activity Limitations Bathing;Sleep;Caring for Others;Carry;Dressing;Transfers;Hygiene/Grooming;Lift;Reach Overhead    Examination-Participation Restrictions Cleaning;Shop;Community Activity;Driving;Yard Work;Interpersonal Relationship;Laundry;Meal Prep    Stability/Clinical Decision Making Stable/Uncomplicated    Clinical Decision Making Low    Rehab Potential Good    PT Frequency 2x / week    PT Duration 12 weeks    PT Treatment/Interventions ADLs/Self Care Home Management;Cryotherapy;Electrical Stimulation;Moist Heat;Therapeutic exercise;Therapeutic activities;Functional mobility training;Ultrasound;Neuromuscular  re-education;Patient/family education;Manual techniques;Vasopneumatic Device;Taping;Splinting;Energy conservation;Dry needling;Passive range of motion;Scar mobilization    PT Next Visit Plan Per R shoulder RTC repair protocol (surgery 09/15/20) - post-op week #3 as of 10/06/20    Consulted and Agree with Plan of Care Patient           Patient will benefit from skilled therapeutic intervention in order to  improve the following deficits and impairments:  Decreased activity tolerance, Decreased range of motion, Decreased safety awareness, Decreased scar mobility, Decreased strength, Hypomobility, Increased edema, Increased fascial restricitons, Increased muscle spasms, Impaired perceived functional ability, Impaired flexibility, Impaired UE functional use, Improper body mechanics, Postural dysfunction, Pain  Visit Diagnosis: Acute pain of right shoulder  Stiffness of right shoulder, not elsewhere classified  Muscle weakness (generalized)     Problem List Patient Active Problem List   Diagnosis Date Noted  . Right epiretinal membrane 03/14/2020  . Cystoid macular edema of right eye 03/14/2020  . Nuclear sclerotic cataract of left eye 03/14/2020  . Pseudophakia 03/14/2020  . Snoring 03/14/2020  . Essential hypertension 12/24/2019  . Lumbar stenosis with neurogenic claudication 06/18/2019  . Lumbar facet arthropathy 10/30/2017  . Osteoarthritis of right hip 09/09/2015  . Status post total replacement of right hip 09/09/2015    Percival Spanish, PT, MPT 10/04/2020, 6:45 PM  [facility] [place], [place], [address] Phone: [phone]   Fax:  [phone]  Name: Matthew [name] MRN: [id number] Date of Birth: 02/24/1956

## 2020-10-10 ENCOUNTER — Ambulatory Visit: Payer: Medicare Other

## 2020-10-10 ENCOUNTER — Other Ambulatory Visit: Payer: Self-pay

## 2020-10-10 DIAGNOSIS — M25611 Stiffness of right shoulder, not elsewhere classified: Secondary | ICD-10-CM

## 2020-10-10 DIAGNOSIS — M25511 Pain in right shoulder: Secondary | ICD-10-CM

## 2020-10-10 DIAGNOSIS — M6281 Muscle weakness (generalized): Secondary | ICD-10-CM

## 2020-10-10 NOTE — Therapy (Signed)
[facility] [address]  [place], [place], [address] Phone: [phone]   Fax:  [phone]  Physical Therapy Treatment  Patient Details  Name: Matthew [name] MRN: [id number] Date of Birth: November 04, 1956 Referring Provider (PT): Mcarthur Rossetti, MD   Encounter Date: 10/10/2020   PT End of Session - 10/10/20 1410    Visit Number 2    Number of Visits 24    Date for PT Re-Evaluation 12/27/20    Authorization Type UHC Medicare    PT Start Time 1403    PT Stop Time 1455    PT Time Calculation (min) 52 min    Activity Tolerance Patient tolerated treatment well    Behavior During Therapy [facility] for tasks assessed/performed           Past Medical History:  Diagnosis Date  . Anxiety   . Arthritis    KNEES BACK SHOULDERS  . Cancer (HCC)    HX SKIN CANCER  . Cataracts, bilateral   . Heart murmur    "nothing to be concerned with, I ve had it all my life."  . Hx of nonmelanoma skin cancer   . Hypertension    FOLLOWED BY DR ED GREEN    Past Surgical History:  Procedure Laterality Date  . ANTERIOR LAT LUMBAR FUSION Left 06/18/2019   Procedure: Left Lumbar three-four Anterolateral lumbar decompression/interbody fusion with lateral plate fixation;  Surgeon: Kristeen Miss, MD;  Location: [facility];  Service: Neurosurgery;  Laterality: Left;  . BACK SURGERY  2010   L5S1 FUSION  . COLONOSCOPY  05/13/2019  . EYE SURGERY  10/2019   per patient retina  . JOINT REPLACEMENT N/A    Phreesia 08/16/2020  . KNEE ARTHROPLASTY  2010   L  . KNEE ARTHROSCOPY     L X 2  . REPLACEMENT TOTAL KNEE Left 11/18/2008  . SHOULDER ARTHROSCOPY Left 05/14/2019  . SPINE SURGERY N/A    Phreesia 08/16/2020  . TOTAL HIP ARTHROPLASTY Right 09/09/2015   Procedure: RIGHT TOTAL HIP ARTHROPLASTY ANTERIOR APPROACH;  Surgeon: Mcarthur Rossetti, MD;  Location: [facility];  Service: Orthopedics;  Laterality: Right;  . TOTAL KNEE ARTHROPLASTY   10/08/2011   Procedure: rightTOTAL KNEE ARTHROPLASTY;  Surgeon: Rudean Haskell, MD;  Location: [facility];  Service: Orthopedics;  Laterality: Right;  TOTAL RIGHT KNEE ARTHROPLASTY     There were no vitals filed for this visit.   Subjective Assessment - 10/10/20 1406    Subjective Pt. reporting he is "pretty much" getting to the exercises.    Pertinent History R RCR 09/15/20; L RCR 05/14/19, HTN, hx skin CA, anxiety, TKA 2012, R THA 2016, L TKA 2010, L5-S1 fusion 2010    Patient Stated Goals "get back to playing pickleball"    Currently in Pain? No/denies    Pain Score 0-No pain    Pain Location Shoulder    Pain Orientation Right;Upper                             [facility] Adult PT Treatment/Exercise - 10/10/20 0001      Shoulder Exercises: Seated   Retraction Both;10 reps;AROM;Strengthening   2 sets    Flexion Right;10 reps;PROM;AAROM    Flexion Limitations table slides    Abduction Right;10 reps;PROM;AAROM    ABduction Limitations scaption table slides    Other Seated Exercises B shoulder rolls x 10  Wrist Exercises   Wrist Flexion AROM;Right;10 reps    Wrist Extension AROM;Right;10 reps      Vasopneumatic   Number Minutes Vasopneumatic  10 minutes    Vasopnuematic Location  Shoulder   R   Vasopneumatic Pressure Low    Vasopneumatic Temperature  34      Manual Therapy   Manual Therapy Passive ROM    Passive ROM R shoulder PROM within protocol limitations                    PT Short Term Goals - 10/10/20 1410      PT SHORT TERM GOAL #1   Title Patient will be independent with initial HEP    Status On-going    Target Date 11/01/20      PT SHORT TERM GOAL #2   Title Patient to improve R shoulder PROM to [facility] without pain provocation    Status On-going    Target Date 11/15/20             PT Long Term Goals - 10/10/20 1411      PT LONG TERM GOAL #1   Title Patient will be independent with ongoing/advanced HEP for self-management at home     Status On-going      PT LONG TERM GOAL #2   Title Patient to improve R shoulder AROM to [facility] without pain provocation    Status On-going      PT LONG TERM GOAL #3   Title Patient will demonstrate improved R shoulder strength to >/= 4/5 to 4+/5 for functional UE use    Status On-going      PT LONG TERM GOAL #4   Title Patient to demonstrate R shoulder elevation to overhead shelf with 5# with good scapular mechanics and no evidence of compensations    Status On-going      PT LONG TERM GOAL #5   Title Patient to report ability to perform ADLs, household, and work-related tasks without limitation due to R shoulder pain, LOM or weakness    Status On-going                 Plan - 10/10/20 1411    Clinical Impression Statement Matthew [name] doing well.  Was seen in clinic today without wearing R shoulder sling and strongly advised by therapist to continue wearing sling for shoulder protection until weaned by MD.  Tolerated gentle progression of table slides and scapular retraction activities well today.  Did have to remind pt. of his shoulder precautions and encourage him to be patient with his shoulder recovery.  Ended visit with ice/compression to R shoulder to reduce post-tx swelling.    Comorbidities R RCR 09/15/20; L RCR 05/14/19, HTN, hx skin CA, anxiety, TKA 2012, R THA 2016, L TKA 2010, L5-S1 fusion 2010    Rehab Potential Good    PT Frequency 2x / week    PT Duration 12 weeks    PT Treatment/Interventions ADLs/Self Care Home Management;Cryotherapy;Electrical Stimulation;Moist Heat;Therapeutic exercise;Therapeutic activities;Functional mobility training;Ultrasound;Neuromuscular re-education;Patient/family education;Manual techniques;Vasopneumatic Device;Taping;Splinting;Energy conservation;Dry needling;Passive range of motion;Scar mobilization    PT Next Visit Plan Per R shoulder RTC repair protocol (surgery 09/15/20) - post-op week #3 as of 10/06/20    Consulted and Agree with Plan of Care  Patient           Patient will benefit from skilled therapeutic intervention in order to improve the following deficits and impairments:  Decreased activity tolerance, Decreased range of motion, Decreased safety awareness,  Decreased scar mobility, Decreased strength, Hypomobility, Increased edema, Increased fascial restricitons, Increased muscle spasms, Impaired perceived functional ability, Impaired flexibility, Impaired UE functional use, Improper body mechanics, Postural dysfunction, Pain  Visit Diagnosis: Acute pain of right shoulder  Stiffness of right shoulder, not elsewhere classified  Muscle weakness (generalized)     Problem List Patient Active Problem List   Diagnosis Date Noted  . Right epiretinal membrane 03/14/2020  . Cystoid macular edema of right eye 03/14/2020  . Nuclear sclerotic cataract of left eye 03/14/2020  . Pseudophakia 03/14/2020  . Snoring 03/14/2020  . Essential hypertension 12/24/2019  . Lumbar stenosis with neurogenic claudication 06/18/2019  . Lumbar facet arthropathy 10/30/2017  . Osteoarthritis of right hip 09/09/2015  . Status post total replacement of right hip 09/09/2015    Bess Harvest, PTA 10/10/20 [facility] [address]  [place], [place], [address] Phone: [phone]   Fax:  [phone]  Name: Matthew [name] MRN: [id number] Date of Birth: [birth year]

## 2020-10-13 ENCOUNTER — Ambulatory Visit: Payer: Medicare Other | Attending: Orthopaedic Surgery

## 2020-10-13 ENCOUNTER — Other Ambulatory Visit: Payer: Self-pay

## 2020-10-13 DIAGNOSIS — M25512 Pain in left shoulder: Secondary | ICD-10-CM | POA: Insufficient documentation

## 2020-10-13 DIAGNOSIS — M25612 Stiffness of left shoulder, not elsewhere classified: Secondary | ICD-10-CM | POA: Insufficient documentation

## 2020-10-13 DIAGNOSIS — M25611 Stiffness of right shoulder, not elsewhere classified: Secondary | ICD-10-CM | POA: Diagnosis present

## 2020-10-13 DIAGNOSIS — M25511 Pain in right shoulder: Secondary | ICD-10-CM | POA: Insufficient documentation

## 2020-10-13 DIAGNOSIS — M6281 Muscle weakness (generalized): Secondary | ICD-10-CM

## 2020-10-13 NOTE — Therapy (Signed)
[facility] [address]  [place], [place], [address] Phone: [phone]   Fax:  [phone]  Physical Therapy Treatment  Patient Details  Name: Matthew [name] MRN: [id number] Date of Birth: [birth year] Referring Provider (PT): Mcarthur Rossetti, MD   Encounter Date: 10/13/2020   PT End of Session - 10/13/20 1020    Visit Number 3    Number of Visits 24    Date for PT Re-Evaluation 12/27/20    Authorization Type [employer] Medicare    PT Start Time 1017    PT Stop Time 1109    PT Time Calculation (min) 52 min    Activity Tolerance Patient tolerated treatment well    Behavior During Therapy [facility] for tasks assessed/performed           Past Medical History:  Diagnosis Date  . Anxiety   . Arthritis    KNEES BACK SHOULDERS  . Cancer (HCC)    HX SKIN CANCER  . Cataracts, bilateral   . Heart murmur    "nothing to be concerned with, I ve had it all my life."  . Hx of nonmelanoma skin cancer   . Hypertension    FOLLOWED BY DR ED GREEN    Past Surgical History:  Procedure Laterality Date  . ANTERIOR LAT LUMBAR FUSION Left 06/18/2019   Procedure: Left Lumbar three-four Anterolateral lumbar decompression/interbody fusion with lateral plate fixation;  Surgeon: Kristeen Miss, MD;  Location: [place];  Service: Neurosurgery;  Laterality: Left;  . BACK SURGERY  2010   L5S1 FUSION  . COLONOSCOPY  05/13/2019  . EYE SURGERY  10/2019   per patient retina  . JOINT REPLACEMENT N/A    Phreesia 08/16/2020  . KNEE ARTHROPLASTY  2010   L  . KNEE ARTHROSCOPY     L X 2  . REPLACEMENT TOTAL KNEE Left 11/18/2008  . SHOULDER ARTHROSCOPY Left 05/14/2019  . SPINE SURGERY N/A    Phreesia 08/16/2020  . TOTAL HIP ARTHROPLASTY Right 09/09/2015   Procedure: RIGHT TOTAL HIP ARTHROPLASTY ANTERIOR APPROACH;  Surgeon: Mcarthur Rossetti, MD;  Location: [facility];  Service: Orthopedics;  Laterality: Right;  . TOTAL KNEE ARTHROPLASTY   10/08/2011   Procedure: rightTOTAL KNEE ARTHROPLASTY;  Surgeon: Rudean Haskell, MD;  Location: [place];  Service: Orthopedics;  Laterality: Right;  TOTAL RIGHT KNEE ARTHROPLASTY     There were no vitals filed for this visit.   Subjective Assessment - 10/13/20 1200    Subjective Has been wearing his shoulder sling.  Feeling fine.    Pertinent History R RCR 09/15/20; L RCR 05/14/19, HTN, hx skin CA, anxiety, TKA 2012, R THA 2016, L TKA 2010, L5-S1 fusion 2010    Patient Stated Goals "get back to playing pickleball"    Currently in Pain? No/denies    Pain Score 0-No pain    Pain Location Shoulder    Pain Orientation Right    Multiple Pain Sites No                             [facility] Adult PT Treatment/Exercise - 10/13/20 0001      Shoulder Exercises: Supine   Flexion Right;AAROM;10 reps    Flexion Limitations wand - well tolerated       Shoulder Exercises: Standing   Other Standing Exercises shrugs x 10      Shoulder Exercises: Pulleys   Flexion 3 minutes  Flexion Limitations tolerated well       Wrist Exercises   Wrist Flexion AROM;Right;15 reps    Wrist Extension AROM;Right;15 reps      Vasopneumatic   Number Minutes Vasopneumatic  10 minutes    Vasopnuematic Location  Shoulder   R    Vasopneumatic Pressure Low    Vasopneumatic Temperature  34      Manual Therapy   Manual Therapy Passive ROM;Joint mobilization    Manual therapy comments supine    Joint Mobilization R shoulder joint mobs grade II     Passive ROM R shoulder PROM within protocol limitations                  PT Education - 10/13/20 1138    Education Details HEP update    Person(s) Educated Patient    Methods Explanation;Demonstration;Verbal cues;Handout    Comprehension Verbalized understanding;Returned demonstration;Verbal cues required            PT Short Term Goals - 10/13/20 1021      PT SHORT TERM GOAL #1   Title Patient will be independent with initial HEP     Status Achieved    Target Date 11/01/20      PT SHORT TERM GOAL #2   Title Patient to improve R shoulder PROM to [facility] without pain provocation    Status On-going    Target Date 11/15/20             PT Long Term Goals - 10/10/20 1411      PT LONG TERM GOAL #1   Title Patient will be independent with ongoing/advanced HEP for self-management at home    Status On-going      PT LONG TERM GOAL #2   Title Patient to improve R shoulder AROM to [facility] without pain provocation    Status On-going      PT LONG TERM GOAL #3   Title Patient will demonstrate improved R shoulder strength to >/= 4/5 to 4+/5 for functional UE use    Status On-going      PT LONG TERM GOAL #4   Title Patient to demonstrate R shoulder elevation to overhead shelf with 5# with good scapular mechanics and no evidence of compensations    Status On-going      PT LONG TERM GOAL #5   Title Patient to report ability to perform ADLs, household, and work-related tasks without limitation due to R shoulder pain, LOM or weakness    Status On-going                 Plan - 10/13/20 1021    Clinical Impression Statement Pt. denies any significant pain since last session.  Feels he does not have questions regarding initial HEP and performing activities daily.  STG #1 met.  With visible improvement in R shoulder PROM ER today along with good tolerance for initiation of supine wand flexion, and flexion pulleys.  Pt. seen wearing sling and verbalizing improved awareness of shoulder precautions post-surgery.  Sees therapy for PN on 12/6 and MD for f/u on 12/09.  Progressing well per protocol.    Comorbidities R RCR 09/15/20; L RCR 05/14/19, HTN, hx skin CA, anxiety, TKA 2012, R THA 2016, L TKA 2010, L5-S1 fusion 2010    Rehab Potential Good    PT Frequency 2x / week    PT Duration 12 weeks    PT Treatment/Interventions ADLs/Self Care Home Management;Cryotherapy;Electrical Stimulation;Moist Heat;Therapeutic exercise;Therapeutic  activities;Functional mobility training;Ultrasound;Neuromuscular re-education;Patient/family education;Manual  techniques;Vasopneumatic Device;Taping;Splinting;Energy conservation;Dry needling;Passive range of motion;Scar mobilization    PT Next Visit Plan Per R shoulder RTC repair protocol (surgery 09/15/20) - post-op week #4 as of 10/13/20    Consulted and Agree with Plan of Care Patient           Patient will benefit from skilled therapeutic intervention in order to improve the following deficits and impairments:  Decreased activity tolerance, Decreased range of motion, Decreased safety awareness, Decreased scar mobility, Decreased strength, Hypomobility, Increased edema, Increased fascial restricitons, Increased muscle spasms, Impaired perceived functional ability, Impaired flexibility, Impaired UE functional use, Improper body mechanics, Postural dysfunction, Pain  Visit Diagnosis: Acute pain of right shoulder  Stiffness of right shoulder, not elsewhere classified  Muscle weakness (generalized)     Problem List Patient Active Problem List   Diagnosis Date Noted  . Right epiretinal membrane 03/14/2020  . Cystoid macular edema of right eye 03/14/2020  . Nuclear sclerotic cataract of left eye 03/14/2020  . Pseudophakia 03/14/2020  . Snoring 03/14/2020  . Essential hypertension 12/24/2019  . Lumbar stenosis with neurogenic claudication 06/18/2019  . Lumbar facet arthropathy 10/30/2017  . Osteoarthritis of right hip 09/09/2015  . Status post total replacement of right hip 09/09/2015    Bess Harvest, PTA 10/13/20 12:08 PM   [facility] [address]  [place], [place], [address] Phone: [phone]   Fax:  [phone]  Name: Matthew [name] MRN: [id number] Date of Birth: [birth year]

## 2020-10-17 ENCOUNTER — Other Ambulatory Visit: Payer: Self-pay

## 2020-10-17 ENCOUNTER — Ambulatory Visit: Payer: Medicare Other

## 2020-10-17 DIAGNOSIS — M25511 Pain in right shoulder: Secondary | ICD-10-CM | POA: Diagnosis not present

## 2020-10-17 DIAGNOSIS — M6281 Muscle weakness (generalized): Secondary | ICD-10-CM

## 2020-10-17 DIAGNOSIS — M25612 Stiffness of left shoulder, not elsewhere classified: Secondary | ICD-10-CM

## 2020-10-17 DIAGNOSIS — M25611 Stiffness of right shoulder, not elsewhere classified: Secondary | ICD-10-CM

## 2020-10-17 DIAGNOSIS — M25512 Pain in left shoulder: Secondary | ICD-10-CM

## 2020-10-17 NOTE — Therapy (Signed)
[facility] [address]  [place], [place], [address] Phone: [phone]   Fax:  [phone]  Physical Therapy Treatment  Patient Details  Name: Matthew [name] MRN: [id number] Date of Birth: 01/06/1956 Referring Provider (PT): Mcarthur Rossetti, MD   Encounter Date: 10/17/2020   PT End of Session - 10/17/20 1020    Visit Number 4    Number of Visits 24    Date for PT Re-Evaluation 12/27/20    Authorization Type [employer] Medicare    PT Start Time 1014    PT Stop Time 1117    PT Time Calculation (min) 63 min    Activity Tolerance Patient tolerated treatment well    Behavior During Therapy [facility] for tasks assessed/performed           Past Medical History:  Diagnosis Date  . Anxiety   . Arthritis    KNEES BACK SHOULDERS  . Cancer (HCC)    HX SKIN CANCER  . Cataracts, bilateral   . Heart murmur    "nothing to be concerned with, I ve had it all my life."  . Hx of nonmelanoma skin cancer   . Hypertension    FOLLOWED BY DR ED GREEN    Past Surgical History:  Procedure Laterality Date  . ANTERIOR LAT LUMBAR FUSION Left 06/18/2019   Procedure: Left Lumbar three-four Anterolateral lumbar decompression/interbody fusion with lateral plate fixation;  Surgeon: Kristeen Miss, MD;  Location: [facility];  Service: Neurosurgery;  Laterality: Left;  . BACK SURGERY  2010   L5S1 FUSION  . COLONOSCOPY  05/13/2019  . EYE SURGERY  10/2019   per patient retina  . JOINT REPLACEMENT N/A    Phreesia 08/16/2020  . KNEE ARTHROPLASTY  2010   L  . KNEE ARTHROSCOPY     L X 2  . REPLACEMENT TOTAL KNEE Left 11/18/2008  . SHOULDER ARTHROSCOPY Left 05/14/2019  . SPINE SURGERY N/A    Phreesia 08/16/2020  . TOTAL HIP ARTHROPLASTY Right 09/09/2015   Procedure: RIGHT TOTAL HIP ARTHROPLASTY ANTERIOR APPROACH;  Surgeon: Mcarthur Rossetti, MD;  Location: [facility];  Service: Orthopedics;  Laterality: Right;  . TOTAL KNEE ARTHROPLASTY   10/08/2011   Procedure: rightTOTAL KNEE ARTHROPLASTY;  Surgeon: Rudean Haskell, MD;  Location: [facility];  Service: Orthopedics;  Laterality: Right;  TOTAL RIGHT KNEE ARTHROPLASTY     There were no vitals filed for this visit.   Subjective Assessment - 10/17/20 1018    Subjective Had some R shoulder soreness after last session.    Pertinent History R RCR 09/15/20; L RCR 05/14/19, HTN, hx skin CA, anxiety, TKA 2012, R THA 2016, L TKA 2010, L5-S1 fusion 2010    Patient Stated Goals "get back to playing pickleball"    Currently in Pain? Yes    Pain Score 2     Pain Location Shoulder    Pain Orientation Right    Pain Descriptors / Indicators Sore    Pain Type Surgical pain;Acute pain    Pain Frequency Intermittent    Multiple Pain Sites No              [facility] PT Assessment - 10/17/20 0001      PROM   PROM Assessment Site Shoulder    Right/Left Shoulder Right    Right Shoulder Flexion 126 Degrees   muscle guarding; 126 dg with AAROM wand flexion in supine    Right Shoulder ABduction 95 Degrees  Right Shoulder Internal Rotation 50 Degrees    Right Shoulder External Rotation 40 Degrees                         [facility] Adult PT Treatment/Exercise - 10/17/20 0001      Shoulder Exercises: Supine   Flexion Right;AAROM;10 reps    Flexion Limitations wand - some soreness    improved with cueing to avoid excessive R shoulder effort      Shoulder Exercises: Prone   Extension Right;AROM;10 reps    Extension Limitations prone to neutral       Shoulder Exercises: Standing   Other Standing Exercises shrugs x 15      Shoulder Exercises: Pulleys   Flexion 3 minutes    Flexion Limitations tolerated well     Scaption 3 minutes    Scaption Limitations asked pt. to stay below shoulder height initially       Shoulder Exercises: Isometric Strengthening   Flexion Limitations 5" x 10    Extension Limitations 5" x 10    External Rotation Limitations 5" x 10    Internal Rotation  Limitations 5" x 10    ABduction Limitations 5" x 10      Vasopneumatic   Number Minutes Vasopneumatic  10 minutes    Vasopnuematic Location  Shoulder    Vasopneumatic Pressure Low    Vasopneumatic Temperature  34      Manual Therapy   Manual Therapy Passive ROM;Joint mobilization;Soft tissue mobilization    Manual therapy comments supine    Joint Mobilization R shoulder joint mobs grade II     Soft tissue mobilization STM to R anterior deltoid, R lateral deltoid, proximal biceps, R pec - ttp in R anterior deltoid     Passive ROM R shoulder PROM all directions to tolerance    pain free - intermittent muscular guarding                  PT Education - 10/17/20 1223    Education Details HEP update;  supine wand AAROM flexion, ER, isometrics    Person(s) Educated Patient    Methods Explanation;Demonstration;Verbal cues;Handout    Comprehension Verbalized understanding;Returned demonstration;Verbal cues required            PT Short Term Goals - 10/13/20 1021      PT SHORT TERM GOAL #1   Title Patient will be independent with initial HEP    Status Achieved    Target Date 11/01/20      PT SHORT TERM GOAL #2   Title Patient to improve R shoulder PROM to [facility] without pain provocation    Status On-going    Target Date 11/15/20             PT Long Term Goals - 10/10/20 1411      PT LONG TERM GOAL #1   Title Patient will be independent with ongoing/advanced HEP for self-management at home    Status On-going      PT LONG TERM GOAL #2   Title Patient to improve R shoulder AROM to [facility] without pain provocation    Status On-going      PT LONG TERM GOAL #3   Title Patient will demonstrate improved R shoulder strength to >/= 4/5 to 4+/5 for functional UE use    Status On-going      PT LONG TERM GOAL #4   Title Patient to demonstrate R shoulder elevation to overhead shelf with  5# with good scapular mechanics and no evidence of compensations    Status On-going       PT LONG TERM GOAL #5   Title Patient to report ability to perform ADLs, household, and work-related tasks without limitation due to R shoulder pain, LOM or weakness    Status On-going                 Plan - 10/17/20 1022    Clinical Impression Statement Waylin reporting increased R anterior shoulder soreness after last session which improved yesterday.  reviewed proper technique with supine AAROM wand flexion with much improved tolerance and able to perform pain free after MT thus issued this along with AAROM ER in supine and isometrics to pt. via HEP handout.  Pt. able to demonstrate R shoulder PROM improvement to flexion 126 dg, abduction 95dg, IR 50 dg, ER to 40 dg with cues required to reduce muscular guarding.    Comorbidities R RCR 09/15/20; L RCR 05/14/19, HTN, hx skin CA, anxiety, TKA 2012, R THA 2016, L TKA 2010, L5-S1 fusion 2010    Rehab Potential Good    PT Frequency 2x / week    PT Duration 12 weeks    PT Treatment/Interventions ADLs/Self Care Home Management;Cryotherapy;Electrical Stimulation;Moist Heat;Therapeutic exercise;Therapeutic activities;Functional mobility training;Ultrasound;Neuromuscular re-education;Patient/family education;Manual techniques;Vasopneumatic Device;Taping;Splinting;Energy conservation;Dry needling;Passive range of motion;Scar mobilization    PT Next Visit Plan Per R shoulder RTC repair protocol (surgery 09/15/20) - post-op week #5 as of 10/20/20    Consulted and Agree with Plan of Care Patient           Patient will benefit from skilled therapeutic intervention in order to improve the following deficits and impairments:  Decreased activity tolerance, Decreased range of motion, Decreased safety awareness, Decreased scar mobility, Decreased strength, Hypomobility, Increased edema, Increased fascial restricitons, Increased muscle spasms, Impaired perceived functional ability, Impaired flexibility, Impaired UE functional use, Improper body mechanics,  Postural dysfunction, Pain  Visit Diagnosis: Acute pain of right shoulder  Stiffness of right shoulder, not elsewhere classified  Muscle weakness (generalized)  Acute pain of left shoulder  Stiffness of left shoulder, not elsewhere classified     Problem List Patient Active Problem List   Diagnosis Date Noted  . Right epiretinal membrane 03/14/2020  . Cystoid macular edema of right eye 03/14/2020  . Nuclear sclerotic cataract of left eye 03/14/2020  . Pseudophakia 03/14/2020  . Snoring 03/14/2020  . Essential hypertension 12/24/2019  . Lumbar stenosis with neurogenic claudication 06/18/2019  . Lumbar facet arthropathy 10/30/2017  . Osteoarthritis of right hip 09/09/2015  . Status post total replacement of right hip 09/09/2015    Bess Harvest, PTA 10/17/20 12:33 PM   [facility] [address]  [place], [place], [address] Phone: [phone]   Fax:  [phone]  Name: Matthew [name] MRN: [id number] Date of Birth: 06-25-1956

## 2020-10-20 ENCOUNTER — Encounter: Payer: Self-pay | Admitting: Orthopaedic Surgery

## 2020-10-20 ENCOUNTER — Ambulatory Visit (INDEPENDENT_AMBULATORY_CARE_PROVIDER_SITE_OTHER): Payer: Medicare Other | Admitting: Orthopaedic Surgery

## 2020-10-20 DIAGNOSIS — Z9889 Other specified postprocedural states: Secondary | ICD-10-CM

## 2020-10-20 NOTE — Progress Notes (Signed)
The patient comes in today at 5 weeks status post a right shoulder arthroscopy with rotator cuff repair.  He is doing well overall and feels like he is making progress through therapy with range of motion and strength.  He did let me know that his mom just passed away on Feb 15, 2023.  She has had only a short illness but has had significant medical issues.  On exam he is abducting his right shoulder a little better.  There is still some stiffness with external rotation and weakness in general to be expected in someone who is 64 years old.  With a significant rotator cuff tear that was repaired.  He understands that this can take 5 to 6 months to really get a good healing process and the good outcome.  I will see him back in about 6 weeks to see how he is doing overall.

## 2020-10-21 ENCOUNTER — Encounter: Payer: Medicare Other | Admitting: Physical Therapy

## 2020-10-25 ENCOUNTER — Other Ambulatory Visit: Payer: Self-pay

## 2020-10-25 ENCOUNTER — Ambulatory Visit: Payer: Medicare Other

## 2020-10-25 DIAGNOSIS — M6281 Muscle weakness (generalized): Secondary | ICD-10-CM

## 2020-10-25 DIAGNOSIS — M25511 Pain in right shoulder: Secondary | ICD-10-CM | POA: Diagnosis not present

## 2020-10-25 DIAGNOSIS — M25612 Stiffness of left shoulder, not elsewhere classified: Secondary | ICD-10-CM

## 2020-10-25 DIAGNOSIS — M25611 Stiffness of right shoulder, not elsewhere classified: Secondary | ICD-10-CM

## 2020-10-25 DIAGNOSIS — M25512 Pain in left shoulder: Secondary | ICD-10-CM

## 2020-10-25 NOTE — Therapy (Signed)
[facility] [address]  [place], [place], [address] Phone: [phone]   Fax:  [phone]  Physical Therapy Treatment  Patient Details  Name: Matthew [name] MRN: [id number] Date of Birth: 09-03-1956 Referring Provider (PT): Mcarthur Rossetti, MD   Encounter Date: 10/25/2020   PT End of Session - 10/25/20 0816    Visit Number 5    Number of Visits 24    Date for PT Re-Evaluation 12/27/20    Authorization Type [employer] Medicare    PT Start Time 0802    PT Stop Time 0857    PT Time Calculation (min) 55 min    Activity Tolerance Patient tolerated treatment well    Behavior During Therapy [facility] for tasks assessed/performed           Past Medical History:  Diagnosis Date  . Anxiety   . Arthritis    KNEES BACK SHOULDERS  . Cancer (HCC)    HX SKIN CANCER  . Cataracts, bilateral   . Heart murmur    "nothing to be concerned with, I ve had it all my life."  . Hx of nonmelanoma skin cancer   . Hypertension    FOLLOWED BY DR ED GREEN    Past Surgical History:  Procedure Laterality Date  . ANTERIOR LAT LUMBAR FUSION Left 06/18/2019   Procedure: Left Lumbar three-four Anterolateral lumbar decompression/interbody fusion with lateral plate fixation;  Surgeon: Kristeen Miss, MD;  Location: [facility];  Service: Neurosurgery;  Laterality: Left;  . BACK SURGERY  2010   L5S1 FUSION  . COLONOSCOPY  05/13/2019  . EYE SURGERY  10/2019   per patient retina  . JOINT REPLACEMENT N/A    Phreesia 08/16/2020  . KNEE ARTHROPLASTY  2010   L  . KNEE ARTHROSCOPY     L X 2  . REPLACEMENT TOTAL KNEE Left 11/18/2008  . SHOULDER ARTHROSCOPY Left 05/14/2019  . SPINE SURGERY N/A    Phreesia 08/16/2020  . TOTAL HIP ARTHROPLASTY Right 09/09/2015   Procedure: RIGHT TOTAL HIP ARTHROPLASTY ANTERIOR APPROACH;  Surgeon: Mcarthur Rossetti, MD;  Location: [facility];  Service: Orthopedics;  Laterality: Right;  . TOTAL KNEE ARTHROPLASTY   10/08/2011   Procedure: rightTOTAL KNEE ARTHROPLASTY;  Surgeon: Rudean Haskell, MD;  Location: [facility];  Service: Orthopedics;  Laterality: Right;  TOTAL RIGHT KNEE ARTHROPLASTY     There were no vitals filed for this visit.   Subjective Assessment - 10/25/20 0805    Subjective Pt. lost his mother last Monday thus has not been getting to the HEP as consistently.    Pertinent History R RCR 09/15/20; L RCR 05/14/19, HTN, hx skin CA, anxiety, TKA 2012, R THA 2016, L TKA 2010, L5-S1 fusion 2010    Patient Stated Goals "get back to playing pickleball"    Currently in Pain? Yes    Pain Score 2     Pain Location Shoulder    Pain Orientation Right    Pain Descriptors / Indicators Sore    Pain Type Surgical pain;Acute pain    Pain Frequency Intermittent    Aggravating Factors  moving arm too much    Multiple Pain Sites No                             [facility] Adult PT Treatment/Exercise - 10/25/20 0001      Elbow Exercises   Elbow Flexion Right;Strengthening;10 reps  Elbow Flexion Limitations 1#      Shoulder Exercises: Supine   Protraction Right;10 reps    Protraction Limitations manual guidance for proper motion      Shoulder Exercises: Seated   External Rotation Right;10 reps;AAROM   pain free well tolerated   External Rotation Limitations seated wand; towel btw elbow and side    Flexion Right;AAROM;10 reps    Flexion Limitations 0-90 dg   pain free well tolerated   Abduction Right;AAROM;10 reps    ABduction Limitations scaption; increased pain thus terminated; pain relief after rest      Shoulder Exercises: Pulleys   Flexion 3 minutes    Flexion Limitations tolerated well     Scaption 3 minutes    Scaption Limitations tolerated well      Shoulder Exercises: Isometric Strengthening   Flexion --   30% effort   Flexion Limitations 5" x 10    Extension --   30% effort   Extension Limitations 5" x 10    External Rotation --   30% effort   External Rotation  Limitations 5" x 10    Internal Rotation --   30% effort   Internal Rotation Limitations 5" x 10    ABduction --   30% effort   ABduction Limitations 5" x 10      Vasopneumatic   Number Minutes Vasopneumatic  10 minutes    Vasopnuematic Location  Shoulder    Vasopneumatic Pressure Low    Vasopneumatic Temperature  34      Manual Therapy   Manual Therapy Passive ROM;Joint mobilization;Soft tissue mobilization    Manual therapy comments supine    Soft tissue mobilization STM to R pec, deltoid complex, UT, posterior/inferior shoulder    Passive ROM R shoulder PROM all directions   Noted improved ROM and reduced guarding in all directions                 PT Education - 10/25/20 1009    Education Details HEP update    Person(s) Educated Patient    Methods Explanation;Demonstration;Verbal cues;Handout    Comprehension Verbalized understanding;Returned demonstration;Verbal cues required            PT Short Term Goals - 10/13/20 1021      PT SHORT TERM GOAL #1   Title Patient will be independent with initial HEP    Status Achieved    Target Date 11/01/20      PT SHORT TERM GOAL #2   Title Patient to improve R shoulder PROM to [facility] without pain provocation    Status On-going    Target Date 11/15/20             PT Long Term Goals - 10/10/20 1411      PT LONG TERM GOAL #1   Title Patient will be independent with ongoing/advanced HEP for self-management at home    Status On-going      PT LONG TERM GOAL #2   Title Patient to improve R shoulder AROM to [facility] without pain provocation    Status On-going      PT LONG TERM GOAL #3   Title Patient will demonstrate improved R shoulder strength to >/= 4/5 to 4+/5 for functional UE use    Status On-going      PT LONG TERM GOAL #4   Title Patient to demonstrate R shoulder elevation to overhead shelf with 5# with good scapular mechanics and no evidence of compensations    Status On-going  PT LONG TERM GOAL #5    Title Patient to report ability to perform ADLs, household, and work-related tasks without limitation due to R shoulder pain, LOM or weakness    Status On-going                 Plan - 10/25/20 0816    Clinical Impression Statement Haron returning to therapy after his mother passed away last week and had to cancel his PT appointment.  Jp without new complaints.  Notes no issues with latest HEP update.  Tolerated initiation of seated AAROM wand flexion, ER well today thus updated HEP with handout issued to pt.  Able to demo much improved R shoulder flexion, scaption, ER motion with AAROM in supine with wand today.  Pt. did not relief from anterior shoulder pain with MT/STM.  Able to continue R shoulder isometrics in neutral position today along with progress to supine serratus punch and 1# biceps curl.  Pt. ending visit with ice/compression to R shoulder to reduce post-exercise soreness.  Pt. to begin post-op week #6 on 12/16.  Progressing well per protocol.    Comorbidities R RCR 09/15/20; L RCR 05/14/19, HTN, hx skin CA, anxiety, TKA 2012, R THA 2016, L TKA 2010, L5-S1 fusion 2010    Rehab Potential Good    PT Frequency 2x / week    PT Duration 12 weeks    PT Treatment/Interventions ADLs/Self Care Home Management;Cryotherapy;Electrical Stimulation;Moist Heat;Therapeutic exercise;Therapeutic activities;Functional mobility training;Ultrasound;Neuromuscular re-education;Patient/family education;Manual techniques;Vasopneumatic Device;Taping;Splinting;Energy conservation;Dry needling;Passive range of motion;Scar mobilization    PT Next Visit Plan Per R shoulder RTC repair protocol (surgery 09/15/20) - post-op week #6 as of 10/27/20    Consulted and Agree with Plan of Care Patient           Patient will benefit from skilled therapeutic intervention in order to improve the following deficits and impairments:  Decreased activity tolerance,Decreased range of motion,Decreased safety  awareness,Decreased scar mobility,Decreased strength,Hypomobility,Increased edema,Increased fascial restricitons,Increased muscle spasms,Impaired perceived functional ability,Impaired flexibility,Impaired UE functional use,Improper body mechanics,Postural dysfunction,Pain  Visit Diagnosis: Acute pain of right shoulder  Stiffness of right shoulder, not elsewhere classified  Muscle weakness (generalized)  Acute pain of left shoulder  Stiffness of left shoulder, not elsewhere classified     Problem List Patient Active Problem List   Diagnosis Date Noted  . Right epiretinal membrane 03/14/2020  . Cystoid macular edema of right eye 03/14/2020  . Nuclear sclerotic cataract of left eye 03/14/2020  . Pseudophakia 03/14/2020  . Snoring 03/14/2020  . Essential hypertension 12/24/2019  . Lumbar stenosis with neurogenic claudication 06/18/2019  . Lumbar facet arthropathy 10/30/2017  . Osteoarthritis of right hip 09/09/2015  . Status post total replacement of right hip 09/09/2015   Bess Harvest, PTA 10/25/20 12:52 PM    [facility] [address]  [place], [place], [address] Phone: [phone]   Fax:  [phone]  Name: ANIEL HUBBLE MRN: [id number] Date of Birth: Apr 02, 1956

## 2020-10-28 ENCOUNTER — Encounter: Payer: Self-pay | Admitting: Physical Therapy

## 2020-10-28 ENCOUNTER — Other Ambulatory Visit: Payer: Self-pay

## 2020-10-28 ENCOUNTER — Ambulatory Visit: Payer: Medicare Other | Admitting: Physical Therapy

## 2020-10-28 DIAGNOSIS — M6281 Muscle weakness (generalized): Secondary | ICD-10-CM

## 2020-10-28 DIAGNOSIS — M25512 Pain in left shoulder: Secondary | ICD-10-CM

## 2020-10-28 DIAGNOSIS — M25612 Stiffness of left shoulder, not elsewhere classified: Secondary | ICD-10-CM

## 2020-10-28 DIAGNOSIS — M25511 Pain in right shoulder: Secondary | ICD-10-CM | POA: Diagnosis not present

## 2020-10-28 DIAGNOSIS — M25611 Stiffness of right shoulder, not elsewhere classified: Secondary | ICD-10-CM

## 2020-10-28 NOTE — Therapy (Signed)
[facility] [address]  [place], [place], [address] Phone: [phone]   Fax:  [phone]  Physical Therapy Treatment  Patient Details  Name: Matthew [name] MRN: [id number] Date of Birth: September 30, 1956 Referring Provider (PT): Matthew Rossetti, MD   Encounter Date: 10/28/2020   PT End of Session - 10/28/20 0803    Visit Number 6    Number of Visits 24    Date for PT Re-Evaluation 12/27/20    Authorization Type [employer]HC Medicare    PT Start Time 0803    PT Stop Time 0854    PT Time Calculation (min) 51 min    Activity Tolerance Patient tolerated treatment well    Behavior During Therapy Matthew [name] for tasks assessed/performed           Past Medical History:  Diagnosis Date  . Anxiety   . Arthritis    KNEES BACK SHOULDERS  . Cancer (HCC)    HX SKIN CANCER  . Cataracts, bilateral   . Heart murmur    "nothing to be concerned with, I ve had it all my life."  . Hx of nonmelanoma skin cancer   . Hypertension    FOLLOWED BY DR ED GREEN    Past Surgical History:  Procedure Laterality Date  . ANTERIOR LAT LUMBAR FUSION Left 06/18/2019   Procedure: Left Lumbar three-four Anterolateral lumbar decompression/interbody fusion with lateral plate fixation;  Surgeon: Matthew Miss, MD;  Location: [facility];  Service: Neurosurgery;  Laterality: Left;  . BACK SURGERY  2010   L5S1 FUSION  . COLONOSCOPY  05/13/2019  . EYE SURGERY  10/2019   per patient retina  . JOINT REPLACEMENT N/A    Phreesia 08/16/2020  . KNEE ARTHROPLASTY  2010   L  . KNEE ARTHROSCOPY     L X 2  . REPLACEMENT TOTAL KNEE Left 11/18/2008  . SHOULDER ARTHROSCOPY Left 05/14/2019  . SPINE SURGERY N/A    Phreesia 08/16/2020  . TOTAL HIP ARTHROPLASTY Right 09/09/2015   Procedure: RIGHT TOTAL HIP ARTHROPLASTY ANTERIOR APPROACH;  Surgeon: Matthew Rossetti, MD;  Location: [facility];  Service: Orthopedics;  Laterality: Right;  . TOTAL KNEE ARTHROPLASTY   10/08/2011   Procedure: rightTOTAL KNEE ARTHROPLASTY;  Surgeon: Matthew Haskell, MD;  Location: [facility];  Service: Orthopedics;  Laterality: Right;  TOTAL RIGHT KNEE ARTHROPLASTY     There were no vitals filed for this visit.   Subjective Assessment - 10/28/20 0806    Subjective Pt reports new pain at R elbow and forearm starting earlier this week w/o known MOI - wonders if he slept on it wrong or if the isometric exercises are bothering it.    Pertinent History R RCR 09/15/20; L RCR 05/14/19, HTN, hx skin CA, anxiety, TKA 2012, R THA 2016, L TKA 2010, L5-S1 fusion 2010    Patient Stated Goals "get back to playing pickleball"    Currently in Pain? Yes    Pain Score 2     Pain Location Shoulder    Pain Descriptors / Indicators Dull;Throbbing    Pain Type Surgical pain;Acute pain    Pain Frequency Intermittent    Pain Score 7   6-7/10   Pain Location Arm    Pain Orientation Right;Lower    Pain Descriptors / Indicators Sharp;Shooting    Pain Type Acute pain    Pain Onset In the past 7 days    Pain Frequency Constant  [place] Adult PT Treatment/Exercise - 10/28/20 0803      Self-Care   Self-Care Heat/Ice Application    Heat/Ice Application provided verbal instruction in ice massge for R lateral elbow      Exercises   Exercises Shoulder      Shoulder Exercises: Standing   External Rotation Right;10 reps;Strengthening;Theraband    Theraband Level (Shoulder External Rotation) Level 1 (Yellow)    External Rotation Limitations isometric step-outs - cues for scap retraction to stabilize shoulder    Internal Rotation Right;10 reps;Strengthening;Theraband    Theraband Level (Shoulder Internal Rotation) Level 1 (Yellow)    Internal Rotation Limitations isometric step-outs    Extension Both;10 reps;Strengthening;Theraband    Theraband Level (Shoulder Extension) Level 1 (Yellow)    Extension Limitations cues to initiate motion with scap retraction     Retraction Both;10 reps;Strengthening;Theraband    Theraband Level (Shoulder Retraction) Level 1 (Yellow)    Retraction Limitations + depression      Shoulder Exercises: Pulleys   Flexion 3 minutes    Flexion Limitations pt noting limitation from R elbow pain and limited ability to fully extend elbow    Scaption 3 minutes      Vasopneumatic   Number Minutes Vasopneumatic  10 minutes    Vasopnuematic Location  Shoulder   R    Vasopneumatic Pressure Low    Vasopneumatic Temperature  34      Manual Therapy   Manual Therapy Soft tissue mobilization;Myofascial release;Passive ROM    Soft tissue mobilization STM/DTM to R pec, deltoid complex, UT, posterior/inferior shoulder, wrist extensor group    Myofascial Release manual TPR & XFM to R wrist extensor group    Passive ROM R shoulder PROM all directions to tolerance                    PT Short Term Goals - 10/28/20 0811      PT SHORT TERM GOAL #1   Title Patient will be independent with initial HEP    Status Achieved   10/13/20     PT SHORT TERM GOAL #2   Title Patient to improve R shoulder PROM to [name] without pain provocation    Status On-going    Target Date 11/15/20             PT Long Term Goals - 10/28/20 0811      PT LONG TERM GOAL #1   Title Patient will be independent with ongoing/advanced HEP for self-management at home    Status On-going    Target Date 12/27/20      PT LONG TERM GOAL #2   Title Patient to improve R shoulder AROM to [facility] without pain provocation    Status On-going    Target Date 12/27/20      PT LONG TERM GOAL #3   Title Patient will demonstrate improved R shoulder strength to >/= 4/5 to 4+/5 for functional UE use    Status On-going      PT LONG TERM GOAL #4   Title Patient to demonstrate R shoulder elevation to overhead shelf with 5# with good scapular mechanics and no evidence of compensations    Status On-going      PT LONG TERM GOAL #5   Title Patient to report ability  to perform ADLs, household, and work-related tasks without limitation due to R shoulder pain, LOM or weakness    Status On-going  Plan - 10/28/20 0844    Clinical Impression Statement [name] reporting increased pain in R lateral elbow and forearm for the past few days with uncertain trigger. Pt noted to have ttp over R lateral epicondyle as well as increased muscle tension in wrist extensor group - addressed with manual STM/DTM/XFM with some pain relief noted and pt noting increased ease of straightening elbow. Discussed ice massage for pain management at home. Pt may also benefit from trial of ionto patch and/or DN to address potential/apparent lateral epicondylitis - order sent to MD requesting approval for ionto. Additional manual STM/DTM & TPR addressing R shoulder musculature with pt noting improving comfort with P/AAROM following this. Progressed exercises per RTC protocol, introducing yellow TB resisted isometric IR/ER as well as scapular strengthening/stabilization with pt no increased pain noted and pt noting good relief with scapular activation.    Comorbidities R RCR 09/15/20; L RCR 05/14/19, HTN, hx skin CA, anxiety, TKA 2012, R THA 2016, L TKA 2010, L5-S1 fusion 2010    Rehab Potential Good    PT Frequency 2x / week    PT Duration 12 weeks    PT Treatment/Interventions ADLs/Self Care Home Management;Cryotherapy;Electrical Stimulation;Moist Heat;Therapeutic exercise;Therapeutic activities;Functional mobility training;Ultrasound;Neuromuscular re-education;Patient/family education;Manual techniques;Vasopneumatic Device;Taping;Splinting;Energy conservation;Dry needling;Passive range of motion;Scar mobilization    PT Next Visit Plan Per R shoulder RTC repair protocol (surgery 09/15/20) - post-op week #6 as of 10/27/20    Consulted and Agree with Plan of Care Patient           Patient will benefit from skilled therapeutic intervention in order to improve the following  deficits and impairments:  Decreased activity tolerance,Decreased range of motion,Decreased safety awareness,Decreased scar mobility,Decreased strength,Hypomobility,Increased edema,Increased fascial restricitons,Increased muscle spasms,Impaired perceived functional ability,Impaired flexibility,Impaired UE functional use,Improper body mechanics,Postural dysfunction,Pain  Visit Diagnosis: Acute pain of right shoulder  Stiffness of right shoulder, not elsewhere classified  Muscle weakness (generalized)  Acute pain of left shoulder  Stiffness of left shoulder, not elsewhere classified     Problem List Patient Active Problem List   Diagnosis Date Noted  . Right epiretinal membrane 03/14/2020  . Cystoid macular edema of right eye 03/14/2020  . Nuclear sclerotic cataract of left eye 03/14/2020  . Pseudophakia 03/14/2020  . Snoring 03/14/2020  . Essential hypertension 12/24/2019  . Lumbar stenosis with neurogenic claudication 06/18/2019  . Lumbar facet arthropathy 10/30/2017  . Osteoarthritis of right hip 09/09/2015  . Status post total replacement of right hip 09/09/2015    Percival Spanish, PT, MPT 10/28/2020, 10:08 AM  [facility] [address]  [place], [place], [address] Phone: [phone]   Fax:  [phone]  Name: Matthew [name] MRN: [id number] Date of Birth: [birth year]

## 2020-11-02 ENCOUNTER — Other Ambulatory Visit: Payer: Self-pay

## 2020-11-02 ENCOUNTER — Ambulatory Visit: Payer: Medicare Other

## 2020-11-02 DIAGNOSIS — M25512 Pain in left shoulder: Secondary | ICD-10-CM

## 2020-11-02 DIAGNOSIS — M6281 Muscle weakness (generalized): Secondary | ICD-10-CM

## 2020-11-02 DIAGNOSIS — M25511 Pain in right shoulder: Secondary | ICD-10-CM | POA: Diagnosis not present

## 2020-11-02 DIAGNOSIS — M25612 Stiffness of left shoulder, not elsewhere classified: Secondary | ICD-10-CM

## 2020-11-02 DIAGNOSIS — M25611 Stiffness of right shoulder, not elsewhere classified: Secondary | ICD-10-CM

## 2020-11-02 NOTE — Therapy (Signed)
[facility] [address]  [place], [place], [address] Phone: [phone]   Fax:  [phone]  Physical Therapy Treatment  Patient Details  Name: Matthew [name] MRN: [id number] Date of Birth: 01-24-1956 Referring Provider (PT): Mcarthur Rossetti, MD   Encounter Date: 11/02/2020   PT End of Session - 11/02/20 0812    Visit Number 7    Number of Visits 24    Date for PT Re-Evaluation 12/27/20    Authorization Type [employer] Medicare    PT Start Time 0802    PT Stop Time 0925    PT Time Calculation (min) 83 min    Activity Tolerance Patient tolerated treatment well    Behavior During Therapy [facility] for tasks assessed/performed           Past Medical History:  Diagnosis Date  . Anxiety   . Arthritis    KNEES BACK SHOULDERS  . Cancer (HCC)    HX SKIN CANCER  . Cataracts, bilateral   . Heart murmur    "nothing to be concerned with, I ve had it all my life."  . Hx of nonmelanoma skin cancer   . Hypertension    FOLLOWED BY DR ED GREEN    Past Surgical History:  Procedure Laterality Date  . ANTERIOR LAT LUMBAR FUSION Left 06/18/2019   Procedure: Left Lumbar three-four Anterolateral lumbar decompression/interbody fusion with lateral plate fixation;  Surgeon: Kristeen Miss, MD;  Location: [place];  Service: Neurosurgery;  Laterality: Left;  . BACK SURGERY  2010   L5S1 FUSION  . COLONOSCOPY  05/13/2019  . EYE SURGERY  10/2019   per patient retina  . JOINT REPLACEMENT N/A    Phreesia 08/16/2020  . KNEE ARTHROPLASTY  2010   L  . KNEE ARTHROSCOPY     L X 2  . REPLACEMENT TOTAL KNEE Left 11/18/2008  . SHOULDER ARTHROSCOPY Left 05/14/2019  . SPINE SURGERY N/A    Phreesia 08/16/2020  . TOTAL HIP ARTHROPLASTY Right 09/09/2015   Procedure: RIGHT TOTAL HIP ARTHROPLASTY ANTERIOR APPROACH;  Surgeon: Mcarthur Rossetti, MD;  Location: [facility];  Service: Orthopedics;  Laterality: Right;  . TOTAL KNEE ARTHROPLASTY   10/08/2011   Procedure: rightTOTAL KNEE ARTHROPLASTY;  Surgeon: Rudean Haskell, MD;  Location: [place];  Service: Orthopedics;  Laterality: Right;  TOTAL RIGHT KNEE ARTHROPLASTY     There were no vitals filed for this visit.   Subjective Assessment - 11/02/20 0807    Subjective Doing ok.  Slept on his R shoulder wrong last night so a little increased soreness.    Pertinent History R RCR 09/15/20; L RCR 05/14/19, HTN, hx skin CA, anxiety, TKA 2012, R THA 2016, L TKA 2010, L5-S1 fusion 2010    Patient Stated Goals "get back to playing pickleball"    Currently in Pain? Yes    Pain Score 1     Pain Location Shoulder    Pain Orientation Right    Pain Descriptors / Indicators Dull    Pain Type Surgical pain;Acute pain    Pain Frequency Intermittent              [facility] PT Assessment - 11/02/20 0001      AROM   AROM Assessment Site --    Right/Left Shoulder --      PROM   PROM Assessment Site Shoulder    Right/Left Shoulder Right    Right Shoulder Flexion 145 Degrees   AAROM  with cane   Right Shoulder ABduction 124 Degrees    Right Shoulder Internal Rotation 64 Degrees    Right Shoulder External Rotation 45 Degrees                         [facility] Adult PT Treatment/Exercise - 11/02/20 0001      Shoulder Exercises: Supine   External Rotation Right;10 reps;AAROM    External Rotation Limitations wand    Flexion Right;AAROM;10 reps    Flexion Limitations wand    ABduction Right;10 reps;AAROM    ABduction Limitations wand      Shoulder Exercises: Seated   External Rotation Right;10 reps;AAROM    External Rotation Limitations seated wand      Shoulder Exercises: Standing   Flexion Right;AAROM    Flexion Limitations wand   visible scapular hiking starting ~ 100 dg; mirror feedback utilized to reduce compensation     Shoulder Exercises: Pulleys   Flexion 3 minutes    Scaption 3 minutes      Shoulder Exercises: Stretch   Internal Rotation Stretch Limitations 5" x  10 wand behind back    Other Shoulder Stretches R shoulder extension AAROM with wand behind back 5"x 10      Modalities   Modalities Iontophoresis      Iontophoresis   Type of Iontophoresis Dexamethasone    Location R lateral epicondyle    Dose 68m-min, 1.050m   Time 4-6 hour wear time   patch #1/6     Vasopneumatic   Number Minutes Vasopneumatic  10 minutes    Vasopnuematic Location  Shoulder   R   Vasopneumatic Pressure Low    Vasopneumatic Temperature  34      Manual Therapy   Manual Therapy Soft tissue mobilization;Myofascial release;Joint mobilization;Passive ROM    Joint Mobilization R shoulder joint mobs grade IIL anterior, posterior, inferior for improved ROM    Soft tissue mobilization DTM/STM to R anterior deltoid, pec, subscap., infra, teres group, UT, biceps    Myofascial Release TPR to R pec, subscap, infra    Passive ROM R shoulder PROM all directions - improved muscular guarding as this progressed                  PT Education - 11/02/20 0945    Education Details ionto educational handout issued to pt. for precautions, contraindications, proper wear time; HEP update; IR, ER isometric yellow TB step-outs, wand AAROM behind back extension IR    Person(s) Educated Patient    Methods Explanation;Demonstration;Verbal cues;Handout    Comprehension Verbalized understanding;Returned demonstration;Verbal cues required            PT Short Term Goals - 11/02/20 0903      PT SHORT TERM GOAL #1   Title Patient will be independent with initial HEP    Status Achieved   10/13/20     PT SHORT TERM GOAL #2   Title Patient to [facility]    Status Partially Met   met for flexion   Target Date 11/15/20             PT Long Term Goals - 10/28/20 0811      PT LONG TERM GOAL #1   Title Patient will be independent with ongoing/advanced HEP for self-management at home    Status On-going    Target Date 12/27/20       PT LONG TERM GOAL #2  Title Patient to improve R shoulder AROM to [facility] without pain provocation    Status On-going    Target Date 12/27/20      PT LONG TERM GOAL #3   Title Patient will demonstrate improved R shoulder strength to >/= 4/5 to 4+/5 for functional UE use    Status On-going      PT LONG TERM GOAL #4   Title Patient to demonstrate R shoulder elevation to overhead shelf with 5# with good scapular mechanics and no evidence of compensations    Status On-going      PT LONG TERM GOAL #5   Title Patient to report ability to perform ADLs, household, and work-related tasks without limitation due to R shoulder pain, LOM or weakness    Status On-going                 Plan - 11/02/20 0817    Clinical Impression Statement [place] seen to start session with increased R shoulder guarding and noting increased pain after "turning wrong" on shoulder in bed last night.  MT focused on reducing R shoulder/scapular guarding.  Pt. able to demonstrate R shoulder PROM flexion progression to 145 dg.  Pt. demonstrating improved PROM in all other planes of motion after focus of MT in today's session.  Progressed standing flexion AAROM today along with initiation of behind back IR, extension ROM.  Pt. tolerated all activities in session well today thus updated HEP accordingly.    Comorbidities R RCR 09/15/20; L RCR 05/14/19, HTN, hx skin CA, anxiety, TKA 2012, R THA 2016, L TKA 2010, L5-S1 fusion 2010    Rehab Potential Good    PT Frequency 2x / week    PT Duration 12 weeks    PT Treatment/Interventions ADLs/Self Care Home Management;Cryotherapy;Electrical Stimulation;Moist Heat;Therapeutic exercise;Therapeutic activities;Functional mobility training;Ultrasound;Neuromuscular re-education;Patient/family education;Manual techniques;Vasopneumatic Device;Taping;Splinting;Energy conservation;Dry needling;Passive range of motion;Scar mobilization    PT Next Visit Plan Per R shoulder RTC repair protocol (surgery  09/15/20) - post-op week #7 as of 11/03/20    Consulted and Agree with Plan of Care Patient           Patient will benefit from skilled therapeutic intervention in order to improve the following deficits and impairments:  Decreased activity tolerance,Decreased range of motion,Decreased safety awareness,Decreased scar mobility,Decreased strength,Hypomobility,Increased edema,Increased fascial restricitons,Increased muscle spasms,Impaired perceived functional ability,Impaired flexibility,Impaired UE functional use,Improper body mechanics,Postural dysfunction,Pain  Visit Diagnosis: Acute pain of right shoulder  Stiffness of right shoulder, not elsewhere classified  Muscle weakness (generalized)  Acute pain of left shoulder  Stiffness of left shoulder, not elsewhere classified     Problem List Patient Active Problem List   Diagnosis Date Noted  . Right epiretinal membrane 03/14/2020  . Cystoid macular edema of right eye 03/14/2020  . Nuclear sclerotic cataract of left eye 03/14/2020  . Pseudophakia 03/14/2020  . Snoring 03/14/2020  . Essential hypertension 12/24/2019  . Lumbar stenosis with neurogenic claudication 06/18/2019  . Lumbar facet arthropathy 10/30/2017  . Osteoarthritis of right hip 09/09/2015  . Status post total replacement of right hip 09/09/2015    Bess Harvest, PTA 11/02/20 1:13 PM   [facility] [address]  [place], [place], [address] Phone: [phone]   Fax:  [phone]  Name: RHYLAND HINDERLITER MRN: [id number] Date of Birth: 1955/12/07

## 2020-11-02 NOTE — Patient Instructions (Signed)

## 2020-11-07 ENCOUNTER — Encounter: Payer: Self-pay | Admitting: Physical Therapy

## 2020-11-07 ENCOUNTER — Other Ambulatory Visit: Payer: Self-pay

## 2020-11-07 ENCOUNTER — Ambulatory Visit: Payer: Medicare Other | Admitting: Physical Therapy

## 2020-11-07 DIAGNOSIS — M25511 Pain in right shoulder: Secondary | ICD-10-CM

## 2020-11-07 DIAGNOSIS — M6281 Muscle weakness (generalized): Secondary | ICD-10-CM

## 2020-11-07 DIAGNOSIS — M25611 Stiffness of right shoulder, not elsewhere classified: Secondary | ICD-10-CM

## 2020-11-07 NOTE — Therapy (Signed)
[facility] [address]  [place], [place], [address] Phone: [phone]   Fax:  [phone]  Physical Therapy Treatment  Patient Details  Name: Matthew [name] MRN: [id number] Date of Birth: [birth year] Referring Provider (PT): Mcarthur Rossetti, MD   Encounter Date: 11/07/2020   PT End of Session - 11/07/20 0803    Visit Number 8    Number of Visits 24    Date for PT Re-Evaluation 12/27/20    Authorization Type [employer] Medicare    PT Start Time 0803    PT Stop Time 6659    PT Time Calculation (min) 54 min    Activity Tolerance Patient tolerated treatment well    Behavior During Therapy [facility] for tasks assessed/performed           Past Medical History:  Diagnosis Date  . Anxiety   . Arthritis    KNEES BACK SHOULDERS  . Cancer (HCC)    HX SKIN CANCER  . Cataracts, bilateral   . Heart murmur    "nothing to be concerned with, I ve had it all my life."  . Hx of nonmelanoma skin cancer   . Hypertension    FOLLOWED BY DR ED GREEN    Past Surgical History:  Procedure Laterality Date  . ANTERIOR LAT LUMBAR FUSION Left 06/18/2019   Procedure: Left Lumbar three-four Anterolateral lumbar decompression/interbody fusion with lateral plate fixation;  Surgeon: Kristeen Miss, MD;  Location: [place];  Service: Neurosurgery;  Laterality: Left;  . BACK SURGERY  2010   L5S1 FUSION  . COLONOSCOPY  05/13/2019  . EYE SURGERY  10/2019   per patient retina  . JOINT REPLACEMENT N/A    Phreesia 08/16/2020  . KNEE ARTHROPLASTY  2010   L  . KNEE ARTHROSCOPY     L X 2  . REPLACEMENT TOTAL KNEE Left 11/18/2008  . SHOULDER ARTHROSCOPY Left 05/14/2019  . SPINE SURGERY N/A    Phreesia 08/16/2020  . TOTAL HIP ARTHROPLASTY Right 09/09/2015   Procedure: RIGHT TOTAL HIP ARTHROPLASTY ANTERIOR APPROACH;  Surgeon: Mcarthur Rossetti, MD;  [facility];  Service: Orthopedics;  Laterality: Right;  . TOTAL KNEE ARTHROPLASTY   10/08/2011   Procedure: rightTOTAL KNEE ARTHROPLASTY;  Surgeon: Rudean Haskell, MD;  Location: [place];  Service: Orthopedics;  Laterality: Right;  TOTAL RIGHT KNEE ARTHROPLASTY     There were no vitals filed for this visit.   Subjective Assessment - 11/07/20 0807    Subjective Pt denies pain but notes some soreness today - "just enough to let me it's there".    Pertinent History R RCR 09/15/20; L RCR 05/14/19, HTN, hx skin CA, anxiety, TKA 2012, R THA 2016, L TKA 2010, L5-S1 fusion 2010    Patient Stated Goals "get back to playing pickleball"    Currently in Pain? Yes    Pain Score 1     Pain Location Shoulder    Pain Orientation Right    Pain Descriptors / Indicators Sore    Pain Frequency Intermittent                             [facility] Adult PT Treatment/Exercise - 11/07/20 0803      Exercises   Exercises Shoulder      Shoulder Exercises: Supine   Horizontal ABduction Both;10 reps;Theraband;Strengthening    Theraband Level (Shoulder Horizontal ABduction) Level 1 (Yellow)    Flexion  Right;AAROM;AROM;10 reps   3 sets   Shoulder Flexion Weight (lbs) 2   on wand   Flexion Limitations PT AAROM progressing to AROM x 2 sets; wand + 2# cuff wt x 1 set      Shoulder Exercises: Prone   Retraction Right;10 reps;AROM;Strengthening    Retraction Limitations row + retraction    Extension Right;10 reps;AROM;Strengthening    Extension Limitations I's to neutral    Horizontal ABduction 1 Right;10 reps;AROM;Strengthening    Horizontal ABduction 1 Limitations T's      Shoulder Exercises: Standing   External Rotation Right;10 reps;Strengthening;Theraband   2 sets   Theraband Level (Shoulder External Rotation) Level 1 (Yellow)    External Rotation Limitations 1st set - isometric step-outs - cues for scap retraction to stabilize shoulder; 2nd set - neutral shoulder with towel roll under elbow + cues for scap retraction    Internal Rotation Right;10 reps;Strengthening;Theraband    2 sets   Theraband Level (Shoulder Internal Rotation) Level 1 (Yellow)    Internal Rotation Limitations 1st set - isometric step-outs - cues for scap retraction to stabilize shoulder; 2nd set - neutral shoulder with towel roll under elbow    Flexion Right;10 reps;AAROM    Flexion Limitations wand - cues to avoid shoulder hike    ABduction Right;10 reps;AAROM    ABduction Limitations wand - cues to avoid shoulder hike      Vasopneumatic   Number Minutes Vasopneumatic  10 minutes    Vasopnuematic Location  Shoulder   R   Vasopneumatic Pressure Low    Vasopneumatic Temperature  34      Manual Therapy   Manual Therapy Joint mobilization;Soft tissue mobilization;Myofascial release;Passive ROM    Joint Mobilization R shoulder - grade II-III inferior & A/P for improved ROM    Soft tissue mobilization STM/DTM to R pec, deltoid complex, posterior/inferior shoulder    Myofascial Release manual TPR to R anterolateral deltoid    Passive ROM R shoulder PROM & MWM to reduce impingement ("catching")                    PT Short Term Goals - 11/02/20 0903      PT SHORT TERM GOAL #1   Title Patient will be independent with initial HEP    Status Achieved   10/13/20     PT SHORT TERM GOAL #2   Title Patient to improve R shoulder PROM to [facility] without pain provocation    Status Partially Met   met for flexion   Target Date 11/15/20             PT Long Term Goals - 10/28/20 0811      PT LONG TERM GOAL #1   Title Patient will be independent with ongoing/advanced HEP for self-management at home    Status On-going    Target Date 12/27/20      PT LONG TERM GOAL #2   Title Patient to improve R shoulder AROM to [facility] without pain provocation    Status On-going    Target Date 12/27/20      PT LONG TERM GOAL #3   Title Patient will demonstrate improved R shoulder strength to >/= 4/5 to 4+/5 for functional UE use    Status On-going      PT LONG TERM GOAL #4   Title Patient to  demonstrate R shoulder elevation to overhead shelf with 5# with good scapular mechanics and no evidence of compensations    Status On-going  PT LONG TERM GOAL #5   Title Patient to report ability to perform ADLs, household, and work-related tasks without limitation due to R shoulder pain, LOM or weakness    Status On-going                 Plan - 11/07/20 0810    Clinical Impression Statement Colm reports continued intermittent compliance with HEP due to limited time to complete exercises. R shoulder flexion ROM remains limited by muscular tightness and "catching" but improved following manual therapy. Therapeutic exercises progressing AROM & light strengthening with good tolerance other than limited ROM with yellow TB ER.    Comorbidities R RCR 09/15/20; L RCR 05/14/19, HTN, hx skin CA, anxiety, TKA 2012, R THA 2016, L TKA 2010, L5-S1 fusion 2010    Rehab Potential Good    PT Frequency 2x / week    PT Duration 12 weeks    PT Treatment/Interventions ADLs/Self Care Home Management;Cryotherapy;Electrical Stimulation;Moist Heat;Therapeutic exercise;Therapeutic activities;Functional mobility training;Ultrasound;Neuromuscular re-education;Patient/family education;Manual techniques;Vasopneumatic Device;Taping;Splinting;Energy conservation;Dry needling;Passive range of motion;Scar mobilization    PT Next Visit Plan Per R shoulder RTC repair protocol (surgery 09/15/20) - post-op week #8 as of 11/10/20    Consulted and Agree with Plan of Care Patient           Patient will benefit from skilled therapeutic intervention in order to improve the following deficits and impairments:  Decreased activity tolerance,Decreased range of motion,Decreased safety awareness,Decreased scar mobility,Decreased strength,Hypomobility,Increased edema,Increased fascial restricitons,Increased muscle spasms,Impaired perceived functional ability,Impaired flexibility,Impaired UE functional use,Improper body  mechanics,Postural dysfunction,Pain  Visit Diagnosis: Acute pain of right shoulder  Stiffness of right shoulder, not elsewhere classified  Muscle weakness (generalized)     Problem List Patient Active Problem List   Diagnosis Date Noted  . Right epiretinal membrane 03/14/2020  . Cystoid macular edema of right eye 03/14/2020  . Nuclear sclerotic cataract of left eye 03/14/2020  . Pseudophakia 03/14/2020  . Snoring 03/14/2020  . Essential hypertension 12/24/2019  . Lumbar stenosis with neurogenic claudication 06/18/2019  . Lumbar facet arthropathy 10/30/2017  . Osteoarthritis of right hip 09/09/2015  . Status post total replacement of right hip 09/09/2015    Percival Spanish, PT, MPT 11/07/2020, 9:01 AM  [facility] [place], [place], [address] Phone: [phone]   Fax:  [phone]  Name: Matthew [name] MRN: [id number] Date of Birth: 07/14/1956

## 2020-11-10 ENCOUNTER — Ambulatory Visit: Payer: Medicare Other

## 2020-11-10 ENCOUNTER — Encounter: Payer: Self-pay | Admitting: Emergency Medicine

## 2020-11-14 ENCOUNTER — Ambulatory Visit: Payer: Medicare Other

## 2020-11-17 ENCOUNTER — Ambulatory Visit: Payer: Medicare Other | Admitting: Physical Therapy

## 2020-11-22 ENCOUNTER — Ambulatory Visit: Payer: Medicare Other | Attending: Orthopaedic Surgery | Admitting: Physical Therapy

## 2020-11-22 ENCOUNTER — Encounter: Payer: Self-pay | Admitting: Physical Therapy

## 2020-11-22 ENCOUNTER — Other Ambulatory Visit: Payer: Self-pay

## 2020-11-22 DIAGNOSIS — M25611 Stiffness of right shoulder, not elsewhere classified: Secondary | ICD-10-CM | POA: Insufficient documentation

## 2020-11-22 DIAGNOSIS — M25511 Pain in right shoulder: Secondary | ICD-10-CM | POA: Diagnosis not present

## 2020-11-22 DIAGNOSIS — M6281 Muscle weakness (generalized): Secondary | ICD-10-CM | POA: Diagnosis present

## 2020-11-22 NOTE — Therapy (Signed)
[facility] [address]  [place], [place], [address] Phone: [phone]   Fax:  [phone]  Physical Therapy Treatment  Patient Details  Name: Matthew [name] MRN: [id number] Date of Birth: Aug 30, 1956 Referring Provider (PT): Mcarthur Rossetti, MD   Encounter Date: 11/22/2020   PT End of Session - 11/22/20 0805    Visit Number 9    Number of Visits 24    Date for PT Re-Evaluation 12/27/20    Authorization Type [employer] Medicare    PT Start Time 0805    PT Stop Time 0858    PT Time Calculation (min) 53 min    Activity Tolerance Patient tolerated treatment well    Behavior During Therapy [facility] for tasks assessed/performed           Past Medical History:  Diagnosis Date  . Anxiety   . Arthritis    KNEES BACK SHOULDERS  . Cancer (HCC)    HX SKIN CANCER  . Cataracts, bilateral   . Heart murmur    "nothing to be concerned with, I ve had it all my life."  . Hx of nonmelanoma skin cancer   . Hypertension    FOLLOWED BY DR ED GREEN    Past Surgical History:  Procedure Laterality Date  . ANTERIOR LAT LUMBAR FUSION Left 06/18/2019   Procedure: Left Lumbar three-four Anterolateral lumbar decompression/interbody fusion with lateral plate fixation;  Surgeon: Kristeen Miss, MD;  Location: [facility];  Service: Neurosurgery;  Laterality: Left;  . BACK SURGERY  2010   L5S1 FUSION  . COLONOSCOPY  05/13/2019  . EYE SURGERY  10/2019   per patient retina  . JOINT REPLACEMENT N/A    Phreesia 08/16/2020  . KNEE ARTHROPLASTY  2010   L  . KNEE ARTHROSCOPY     L X 2  . REPLACEMENT TOTAL KNEE Left 11/18/2008  . SHOULDER ARTHROSCOPY Left 05/14/2019  . SPINE SURGERY N/A    Phreesia 08/16/2020  . TOTAL HIP ARTHROPLASTY Right 09/09/2015   Procedure: RIGHT TOTAL HIP ARTHROPLASTY ANTERIOR APPROACH;  Surgeon: Mcarthur Rossetti, MD;  Location[facility];  Service: Orthopedics;  Laterality: Right;  . TOTAL KNEE ARTHROPLASTY   10/08/2011   Procedure: rightTOTAL KNEE ARTHROPLASTY;  Surgeon: Rudean Haskell, MD;  Location: [facility];  Service: Orthopedics;  Laterality: Right;  TOTAL RIGHT KNEE ARTHROPLASTY     There were no vitals filed for this visit.   Subjective Assessment - 11/22/20 0808    Subjective Pt reports some increased soreness for the past few days w/o known trigger - thinks he needs some massage therapy.    Pertinent History R RCR 09/15/20; L RCR 05/14/19, HTN, hx skin CA, anxiety, TKA 2012, R THA 2016, L TKA 2010, L5-S1 fusion 2010    Patient Stated Goals "get back to playing pickleball"    Currently in Pain? Yes    Pain Score 3     Pain Location Shoulder    Pain Orientation Right    Pain Descriptors / Indicators Sore    Pain Type Surgical pain;Acute pain    Pain Frequency Intermittent              [facility] PT Assessment - 11/22/20 0805      Assessment   Next MD Visit 12/01/20                         [facility] Adult PT Treatment/Exercise - 11/22/20 7939  Exercises   Exercises Shoulder      Shoulder Exercises: Prone   Retraction Right;10 reps;AROM;Strengthening    Retraction Limitations row + retraction    Extension Right;10 reps;AROM;Strengthening    Extension Limitations I's to neutral    Horizontal ABduction 1 Right;10 reps;AROM;Strengthening    Horizontal ABduction 1 Limitations T's      Shoulder Exercises: Standing   External Rotation Right;10 reps;Strengthening;Theraband;AROM    Theraband Level (Shoulder External Rotation) Level 1 (Yellow)    External Rotation Limitations neutral shoulder with towel roll under elbow + cues for scap retraction    Internal Rotation Right;10 reps;Strengthening;Theraband;AROM    Theraband Level (Shoulder Internal Rotation) Level 1 (Yellow)    Internal Rotation Limitations neutral shoulder with towel roll under elbow    Flexion Right;10 reps;AAROM    Flexion Limitations walll slides    ABduction Right;10 reps;AAROM    ABduction  Limitations scaption walll slides    Row Both;10 reps;Strengthening;Theraband    Theraband Level (Shoulder Row) Level 2 (Red)    Row Limitations cues for scap retraction + depression    Retraction Both;10 reps;Strengthening;Theraband    Theraband Level (Shoulder Retraction) Level 2 (Red)    Retraction Limitations + depression with shoulder extension to neutral      Shoulder Exercises: ROM/Strengthening   UBE (Upper Arm Bike) L1.0 x 6 min (3' fwd/3' back)      Vasopneumatic   Number Minutes Vasopneumatic  10 minutes    Vasopnuematic Location  Shoulder   R   Vasopneumatic Pressure Low    Vasopneumatic Temperature  34      Manual Therapy   Manual Therapy Soft tissue mobilization;Myofascial release    Soft tissue mobilization STM/DTM to R pec, deltoid complex, UT, posterior/inferior shoulder    Myofascial Release manual TPR to R pecs & anterolateral deltoid                    PT Short Term Goals - 11/22/20 0845      PT SHORT TERM GOAL #1   Title Patient will be independent with initial HEP    Status Achieved   10/13/20     PT SHORT TERM GOAL #2   Title Patient to improve R shoulder PROM to [facility] without pain provocation    Status Partially Met   11/02/20 - met for flexion   Target Date 11/15/20             PT Long Term Goals - 11/22/20 0845      PT LONG TERM GOAL #1   Title Patient will be independent with ongoing/advanced HEP for self-management at home    Status On-going    Target Date 12/27/20      PT LONG TERM GOAL #2   Title Patient to improve R shoulder AROM to [facility] without pain provocation    Status On-going    Target Date 12/27/20      PT LONG TERM GOAL #3   Title Patient will demonstrate improved R shoulder strength to >/= 4/5 to 4+/5 for functional UE use    Status On-going    Target Date 12/27/20      PT LONG TERM GOAL #4   Title Patient to demonstrate R shoulder elevation to overhead shelf with 5# with good scapular mechanics and no evidence of  compensations    Status On-going    Target Date 12/27/20      PT LONG TERM GOAL #5   Title Patient to report ability to  perform ADLs, household, and work-related tasks without limitation due to R shoulder pain, LOM or weakness    Status On-going    Target Date 12/27/20                 Plan - 11/22/20 0810    Clinical Impression Statement Matthew [name] returning to PT for 1st time in 2 weeks today. He reports increased pain w/o known trigger - increased tightness noted in anterior and posterior inferior shoulder which was addressed with manual therapy. Pain and tightness limiting overhead ROM despite reported using of pulleys for AAROM/stretching at home, therefore provided education in wall slides for AAROM/stretching into flexion and scaption with cues avoiding hiking shoulder. RTC strengthening progressed from isometric to AROM with yellow TB and red TB scap retraction/rows added to HEP to promote better shoulder posture for good GHJ alignment. He has reduced his PT frequency to 1x/wk due to insurance copay, so will plan for more regular updates to HEP to continue to progress R shoulder ROM and strength.    Comorbidities R RCR 09/15/20; L RCR 05/14/19, HTN, hx skin CA, anxiety, TKA 2012, R THA 2016, L TKA 2010, L5-S1 fusion 2010    Rehab Potential Good    PT Frequency 2x / week    PT Duration 12 weeks    PT Treatment/Interventions ADLs/Self Care Home Management;Cryotherapy;Electrical Stimulation;Moist Heat;Therapeutic exercise;Therapeutic activities;Functional mobility training;Ultrasound;Neuromuscular re-education;Patient/family education;Manual techniques;Vasopneumatic Device;Taping;Splinting;Energy conservation;Dry needling;Passive range of motion;Scar mobilization    PT Next Visit Plan Per R shoulder RTC repair protocol (surgery 09/15/20) - post-op week #10 as of 11/24/20    Consulted and Agree with Plan of Care Patient           Patient will benefit from skilled therapeutic intervention in  order to improve the following deficits and impairments:  Decreased activity tolerance,Decreased range of motion,Decreased safety awareness,Decreased scar mobility,Decreased strength,Hypomobility,Increased edema,Increased fascial restricitons,Increased muscle spasms,Impaired perceived functional ability,Impaired flexibility,Impaired UE functional use,Improper body mechanics,Postural dysfunction,Pain  Visit Diagnosis: Acute pain of right shoulder  Stiffness of right shoulder, not elsewhere classified  Muscle weakness (generalized)     Problem List Patient Active Problem List   Diagnosis Date Noted  . Right epiretinal membrane 03/14/2020  . Cystoid macular edema of right eye 03/14/2020  . Nuclear sclerotic cataract of left eye 03/14/2020  . Pseudophakia 03/14/2020  . Snoring 03/14/2020  . Essential hypertension 12/24/2019  . Lumbar stenosis with neurogenic claudication 06/18/2019  . Lumbar facet arthropathy 10/30/2017  . Osteoarthritis of right hip 09/09/2015  . Status post total replacement of right hip 09/09/2015    Percival Spanish, PT, MPT 11/22/2020, 12:37 PM  [facility] [address]  [place], [place], [address] Phone: [phone]   Fax:  [phone]  Name: Matthew [name] MRN: [id number] Date of Birth: 04/29/1956

## 2020-11-22 NOTE — Patient Instructions (Signed)
    Home exercise program created by Peter Keyworth, PT.  For questions, please contact Ayliana Casciano via phone at [phone] or email at [email or web address]  [facility] [address]  [address] [place], [place], [address] Phone: [phone]   Fax:  [phone]    

## 2020-11-23 ENCOUNTER — Ambulatory Visit (INDEPENDENT_AMBULATORY_CARE_PROVIDER_SITE_OTHER): Payer: Medicare Other | Admitting: Ophthalmology

## 2020-11-23 ENCOUNTER — Encounter (INDEPENDENT_AMBULATORY_CARE_PROVIDER_SITE_OTHER): Payer: Self-pay | Admitting: Ophthalmology

## 2020-11-23 DIAGNOSIS — H401131 Primary open-angle glaucoma, bilateral, mild stage: Secondary | ICD-10-CM

## 2020-11-23 DIAGNOSIS — H35351 Cystoid macular degeneration, right eye: Secondary | ICD-10-CM

## 2020-11-23 DIAGNOSIS — H2512 Age-related nuclear cataract, left eye: Secondary | ICD-10-CM

## 2020-11-23 DIAGNOSIS — H35371 Puckering of macula, right eye: Secondary | ICD-10-CM | POA: Diagnosis not present

## 2020-11-23 NOTE — Progress Notes (Signed)
11/23/2020     CHIEF COMPLAINT Patient presents for Retina Follow Up (8 Month f\u OU. OCT/Pt states allergies are making eyes watery and itchy, OD>OS. Pt states vision has been stable. Pt has not seen any recent floaters. Denies FOL)   HISTORY OF PRESENT ILLNESS: Matthew [name] is a 65 y.o. male who presents to the clinic today for:   HPI    Retina Follow Up    Diagnosis: ERM.  In right eye.  Severity is moderate.  Duration of 8 months.  Since onset it is stable.  I, the attending physician,  performed the HPI with the patient and updated documentation appropriately. Additional comments: 8 Month f\u OU. OCT Pt states allergies are making eyes watery and itchy, OD>OS. Pt states vision has been stable. Pt has not seen any recent floaters. Denies FOL       Last edited by Tilda Franco on 11/23/2020  8:28 AM. (History)      Referring physician: Horald Pollen, MD [place],  [place] [address]  HISTORICAL INFORMATION:   Selected notes from the MEDICAL RECORD NUMBER    Lab Results  Component Value Date   HGBA1C 5.7 (H) 08/18/2020     CURRENT MEDICATIONS: Current Outpatient Medications (Ophthalmic Drugs)  Medication Sig  . ketorolac (ACULAR) 0.5 % ophthalmic solution Place 1 drop into the right eye 4 (four) times daily.   Marland Kitchen latanoprost (XALATAN) 0.005 % ophthalmic solution Place 1 drop into both eyes at bedtime.   No current facility-administered medications for this visit. (Ophthalmic Drugs)   Current Outpatient Medications (Other)  Medication Sig  . ALPRAZolam (XANAX) 1 MG tablet Take 1 tablet (1 mg total) by mouth at bedtime as needed for sleep.  Marland Kitchen aspirin EC 325 MG EC tablet Take 1 tablet (325 mg total) by mouth 2 (two) times daily after a meal.  . celecoxib (CELEBREX) 200 MG capsule Take 1 capsule (200 mg total) by mouth 2 (two) times daily between meals as needed.  . Coenzyme Q10 (COQ10 PO) Take 300 mg by mouth daily.  . diazepam (VALIUM) 5 MG  tablet TAKE ONE TAB ONE HOUR PRIOR TO MRI REPEAT AS NEEDED #2 . ZERO REFILLS  . Glucosamine-Chondroitin-MSM (GLUCOSAMINE CHONDROIT MSM DS) TABS Take 1,500 mg by mouth daily.  . hydrochlorothiazide (HYDRODIURIL) 25 MG tablet Take 25 mg by mouth daily.   Marland Kitchen lisinopril (ZESTRIL) 20 MG tablet Take 1 tablet (20 mg total) by mouth daily.  . methocarbamol (ROBAXIN) 500 MG tablet Take 1 tablet (500 mg total) by mouth every 6 (six) hours as needed for muscle spasms.  Marland Kitchen OVER THE COUNTER MEDICATION   . oxyCODONE (ROXICODONE) 5 MG immediate release tablet Take 1 tablet (5 mg total) by mouth every 6 (six) hours as needed for severe pain.  Marland Kitchen VITAMIN D PO Take by mouth daily.   No current facility-administered medications for this visit. (Other)      REVIEW OF SYSTEMS:    ALLERGIES No Known Allergies  PAST MEDICAL HISTORY Past Medical History:  Diagnosis Date  . Anxiety   . Arthritis    KNEES BACK SHOULDERS  . Cancer (HCC)    HX SKIN CANCER  . Cataracts, bilateral   . Heart murmur    "nothing to be concerned with, I ve had it all my life."  . Hx of nonmelanoma skin cancer   . Hypertension    FOLLOWED BY DR ED GREEN   Past Surgical History:  Procedure Laterality  Date  . ANTERIOR LAT LUMBAR FUSION Left 06/18/2019   Procedure: Left Lumbar three-four Anterolateral lumbar decompression/interbody fusion with lateral plate fixation;  Surgeon: Kristeen Miss, MD;  Location: [facility];  Service: Neurosurgery;  Laterality: Left;  . BACK SURGERY  2010   L5S1 FUSION  . COLONOSCOPY  05/13/2019  . EYE SURGERY  10/2019   per patient retina  . JOINT REPLACEMENT N/A    Phreesia 08/16/2020  . KNEE ARTHROPLASTY  2010   L  . KNEE ARTHROSCOPY     L X 2  . REPLACEMENT TOTAL KNEE Left 11/18/2008  . SHOULDER ARTHROSCOPY Left 05/14/2019  . SPINE SURGERY N/A    Phreesia 08/16/2020  . TOTAL HIP ARTHROPLASTY Right 09/09/2015   Procedure: RIGHT TOTAL HIP ARTHROPLASTY ANTERIOR APPROACH;  Surgeon: Mcarthur Rossetti, MD;  [facility];  Service: Orthopedics;  Laterality: Right;  . TOTAL KNEE ARTHROPLASTY  10/08/2011   Procedure: rightTOTAL KNEE ARTHROPLASTY;  Surgeon: Rudean Haskell, MD;  Location: [facility];  Service: Orthopedics;  Laterality: Right;  TOTAL RIGHT KNEE ARTHROPLASTY     FAMILY HISTORY Family History  Problem Relation Age of Onset  . Heart attack Brother        70  . Hypertension Brother   . Stroke Brother   . Heart attack Paternal Grandfather        5 bypass surgeries  . Prostate cancer Paternal Grandfather   . Cancer Father        died of bladder cancer, prostate  . Cancer Maternal Grandmother   . Hypertension Brother     SOCIAL HISTORY Social History   Tobacco Use  . Smoking status: Never Smoker  . Smokeless tobacco: Never Used  Vaping Use  . Vaping Use: Never used  Substance Use Topics  . Alcohol use: Yes    Alcohol/week: 2.0 standard drinks    Types: 2 Cans of beer per week    Comment: OCCASIONAL-wine, beer,mixed  . Drug use: No         OPHTHALMIC EXAM: Base Eye Exam    Visual Acuity (Snellen - Linear)      Right Left   Dist cc 20/20 20/20   Correction: Glasses       Tonometry (Tonopen, 8:32 AM)      Right Left   Pressure 21 28       Pupils      Pupils Dark Light Shape React APD   Right PERRL 3 2 Round Slow None   Left PERRL 3 2 Round Slow None       Visual Fields (Counting fingers)      Left Right    Full Full       Neuro/Psych    Oriented x3: Yes   Mood/Affect: Normal       Dilation    Right eye: 1.0% Mydriacyl, 2.5% Phenylephrine @ 8:32 AM       Dilation #2    Left eye: 1.0% Mydriacyl, 2.5% Phenylephrine @ 8:55 AM        Slit Lamp and Fundus Exam    External Exam      Right Left   External Normal Normal       Slit Lamp Exam      Right Left   Lids/Lashes Normal Normal   Conjunctiva/Sclera White and quiet White and quiet   Cornea Clear Clear   Anterior Chamber Deep and quiet Deep and quiet   Iris Round and  reactive Round and reactive  Lens Posterior chamber intraocular lens 2+ Nuclear sclerosis   Anterior Vitreous Normal Normal       Fundus Exam      Right Left   Posterior Vitreous Clear vitrectomized Normal   Disc Normal Normal   C/D Ratio 0.5 0.4   Macula Normal, no topo distortion Normal   Vessels Normal Normal   Periphery Normal Normal          IMAGING AND PROCEDURES  Imaging and Procedures for 11/23/20  OCT, Retina - OU - Both Eyes       Right Eye Quality was good. Scan locations included subfoveal. Central Foveal Thickness: 399. Progression has been stable. Findings include abnormal foveal contour.   Left Eye Quality was good. Scan locations included subfoveal. Central Foveal Thickness: 285. Findings include normal foveal contour.   Notes OD status post vitrectomy membrane peel, early 2021.  Looks great, acuity improved, CME which was present in March 2021 completely resolved.                ASSESSMENT/PLAN:  Cystoid macular edema of right eye Last present March 2021 now completely resolved.  No medical therapy at this time indicated ID for this condition  Nuclear sclerotic cataract of left eye The nature of cataract was discussed with the patient as well as the elective nature of surgery. The patient was reassured that surgery at a later date does not put the patient at risk for a worse outcome. It was emphasized that the need for surgery is dictated by the patient's quality of life as influenced by the cataract. Patient was instructed to maintain close follow up with their general eye care doctor.  Follow-up with Dr. Leodis Rains, I discussed with patient that his lens is clear yet has dark yellow-green color  Primary open angle glaucoma of both eyes, mild stage Open-angle glaucoma therapy follow-up as per Dr. Luberta Mutter      ICD-10-CM   1. Right epiretinal membrane  H35.371 OCT, Retina - OU - Both Eyes  2. Cystoid macular edema of right eye   H35.351   3. Nuclear sclerotic cataract of left eye  H25.12   4. Primary open angle glaucoma of both eyes, mild stage  H40.1131     1.  OS, minor elevation in intraocular pressure, likely because patient's left eye is still phakic and has some compliance issues when the drops were misplaced  2.  I will up with Dr. Leodis Rains as scheduled  3.  There are thickening of the right eye continues to slowly improve post membrane peel for epiretinal membrane and vision loss, vision looks great  Ophthalmic Meds Ordered this visit:  No orders of the defined types were placed in this encounter.      Return in about 1 year (around 11/23/2021) for DILATE OU, OCT.  There are no Patient Instructions on file for this visit.   Explained the diagnoses, plan, and follow up with the patient and they expressed understanding.  Patient expressed understanding of the importance of proper follow up care.   Clent Demark Jadian Karman M.D. Diseases & Surgery of the Retina and Vitreous [facility] [place] 11/23/20     Abbreviations: M myopia (nearsighted); A astigmatism; H hyperopia (farsighted); P presbyopia; Mrx spectacle prescription;  CTL contact lenses; OD right eye; OS left eye; OU both eyes  XT exotropia; ET esotropia; PEK punctate epithelial keratitis; PEE punctate epithelial erosions; DES dry eye syndrome; MGD meibomian gland dysfunction; ATs artificial tears; PFAT's preservative  free artificial tears; [place] nuclear sclerotic cataract; PSC posterior subcapsular cataract; ERM epi-retinal membrane; PVD posterior vitreous detachment; RD retinal detachment; DM diabetes mellitus; DR diabetic retinopathy; NPDR non-proliferative diabetic retinopathy; PDR proliferative diabetic retinopathy; CSME clinically significant macular edema; DME diabetic macular edema; dbh dot blot hemorrhages; CWS cotton wool spot; POAG primary open angle glaucoma; C/D cup-to-disc ratio; HVF humphrey visual field; GVF goldmann visual  field; OCT optical coherence tomography; IOP intraocular pressure; BRVO Branch retinal vein occlusion; CRVO central retinal vein occlusion; CRAO central retinal artery occlusion; BRAO branch retinal artery occlusion; RT retinal tear; SB scleral buckle; PPV pars plana vitrectomy; VH Vitreous hemorrhage; PRP panretinal laser photocoagulation; IVK intravitreal kenalog; VMT vitreomacular traction; MH Macular hole;  NVD neovascularization of the disc; NVE neovascularization elsewhere; AREDS age related eye disease study; ARMD age related macular degeneration; POAG primary open angle glaucoma; EBMD epithelial/anterior basement membrane dystrophy; ACIOL anterior chamber intraocular lens; IOL intraocular lens; PCIOL posterior chamber intraocular lens; Phaco/IOL phacoemulsification with intraocular lens placement; [place] photorefractive keratectomy; LASIK laser assisted in situ keratomileusis; HTN hypertension; DM diabetes mellitus; COPD chronic obstructive pulmonary disease

## 2020-11-23 NOTE — Assessment & Plan Note (Signed)
Last present March 2021 now completely resolved.  No medical therapy at this time indicated ID for this condition

## 2020-11-23 NOTE — Assessment & Plan Note (Signed)
The nature of cataract was discussed with the patient as well as the elective nature of surgery. The patient was reassured that surgery at a later date does not put the patient at risk for a worse outcome. It was emphasized that the need for surgery is dictated by the patient's quality of life as influenced by the cataract. Patient was instructed to maintain close follow up with their general eye care doctor.  Follow-up with Dr. Leodis Rains, I discussed with patient that his lens is clear yet has dark yellow-green color

## 2020-11-23 NOTE — Assessment & Plan Note (Signed)
Open-angle glaucoma therapy follow-up as per Dr. Luberta Mutter

## 2020-11-25 ENCOUNTER — Encounter: Payer: Medicare Other | Admitting: Physical Therapy

## 2020-11-28 ENCOUNTER — Ambulatory Visit: Payer: Medicare Other

## 2020-12-01 ENCOUNTER — Ambulatory Visit: Payer: Medicare Other | Admitting: Orthopaedic Surgery

## 2020-12-06 ENCOUNTER — Ambulatory Visit: Payer: Medicare Other | Admitting: Physical Therapy

## 2020-12-06 ENCOUNTER — Encounter: Payer: Self-pay | Admitting: Physical Therapy

## 2020-12-06 ENCOUNTER — Other Ambulatory Visit: Payer: Self-pay

## 2020-12-06 DIAGNOSIS — M6281 Muscle weakness (generalized): Secondary | ICD-10-CM

## 2020-12-06 DIAGNOSIS — M25611 Stiffness of right shoulder, not elsewhere classified: Secondary | ICD-10-CM

## 2020-12-06 DIAGNOSIS — M25511 Pain in right shoulder: Secondary | ICD-10-CM | POA: Diagnosis not present

## 2020-12-06 NOTE — Patient Instructions (Signed)

## 2020-12-06 NOTE — Therapy (Addendum)
[facility] [address]  [place], [place], [address] Phone: [phone]   Fax:  [phone]  Physical Therapy Treatment / Progress Note / Discharge Summary  Patient Details  Name: Matthew [name] MRN: [id number] Date of Birth: February 28, 1956 Referring Provider (PT): Mcarthur Rossetti, MD   Encounter Date: 12/06/2020   PT End of Session - 12/06/20 0805    Visit Number 10    Number of Visits 24    Date for PT Re-Evaluation 12/27/20    Authorization Type [employer]    PT Start Time 0805    PT Stop Time 0847    PT Time Calculation (min) 42 min    Activity Tolerance Patient tolerated treatment well    Behavior During Therapy [facility] for tasks assessed/performed           Past Medical History:  Diagnosis Date  . Anxiety   . Arthritis    KNEES BACK SHOULDERS  . Cancer (HCC)    HX SKIN CANCER  . Cataracts, bilateral   . Heart murmur    "nothing to be concerned with, I ve had it all my life."  . Hx of nonmelanoma skin cancer   . Hypertension    FOLLOWED BY DR ED GREEN    Past Surgical History:  Procedure Laterality Date  . ANTERIOR LAT LUMBAR FUSION Left 06/18/2019   Procedure: Left Lumbar three-four Anterolateral lumbar decompression/interbody fusion with lateral plate fixation;  Surgeon: Kristeen Miss, MD;  Location: [place];  Service: Neurosurgery;  Laterality: Left;  . BACK SURGERY  2010   L5S1 FUSION  . COLONOSCOPY  05/13/2019  . EYE SURGERY  10/2019   per patient retina  . JOINT REPLACEMENT N/A    Phreesia 08/16/2020  . KNEE ARTHROPLASTY  2010   L  . KNEE ARTHROSCOPY     L X 2  . REPLACEMENT TOTAL KNEE Left 11/18/2008  . SHOULDER ARTHROSCOPY Left 05/14/2019  . SPINE SURGERY N/A    Phreesia 08/16/2020  . TOTAL HIP ARTHROPLASTY Right 09/09/2015   Procedure: RIGHT TOTAL HIP ARTHROPLASTY ANTERIOR APPROACH;  Surgeon: Mcarthur Rossetti, MD;  Location: [facility];  Service: Orthopedics;  Laterality:  Right;  . TOTAL KNEE ARTHROPLASTY  10/08/2011   Procedure: rightTOTAL KNEE ARTHROPLASTY;  Surgeon: Rudean Haskell, MD;  Location: [place];  Service: Orthopedics;  Laterality: Right;  TOTAL RIGHT KNEE ARTHROPLASTY     There were no vitals filed for this visit.   Subjective Assessment - 12/06/20 0807    Subjective Pt reports he missed his visit with the surgeon and has rescheduled for 12/13/20. He arrives to PT in a L wrist splint, reporting a new ligamentous injury and avulsion fracture.    Pertinent History R RCR 09/15/20; L RCR 05/14/19, HTN, hx skin CA, anxiety, TKA 2012, R THA 2016, L TKA 2010, L5-S1 fusion 2010    Patient Stated Goals "get back to playing pickleball"    Currently in Pain? Yes    Pain Score 0-No pain   1/10 with movement   Pain Location Shoulder    Pain Orientation Right    Pain Descriptors / Indicators Dull    Pain Type Surgical pain;Acute pain    Pain Frequency Intermittent    Pain Score 4    Pain Location Wrist    Pain Orientation Left    Pain Descriptors / Indicators Numbness    Pain Type Acute pain    Pain Onset More than a  month ago   Oct 2021   Pain Frequency Intermittent              [facility] PT Assessment - 12/06/20 0805      Assessment   Medical Diagnosis R shoulder RCR - supraspinatus tendon    Referring Provider (PT) Mcarthur Rossetti, MD    Onset Date/Surgical Date 09/15/20    Hand Dominance Right    Next MD Visit 12/13/20      Observation/Other Assessments   Focus on Therapeutic Outcomes (FOTO)  Shoulder - 60% (40% limitation)      AROM   Right Shoulder Flexion 134 Degrees    Right Shoulder ABduction 109 Degrees    Right Shoulder Internal Rotation 72 Degrees    Right Shoulder External Rotation 38 Degrees      PROM   Right Shoulder Flexion 150 Degrees    Right Shoulder ABduction 138 Degrees    Right Shoulder Internal Rotation 74 Degrees    Right Shoulder External Rotation 48 Degrees   52 after review of wand AAROM stretches      Strength   Right Shoulder Flexion 4+/5    Right Shoulder ABduction 4/5    Right Shoulder Internal Rotation 4+/5    Right Shoulder External Rotation 4-/5    Left Shoulder Flexion 5/5    Left Shoulder ABduction 5/5    Left Shoulder Internal Rotation 5/5    Left Shoulder External Rotation 5/5                         [facility] Adult PT Treatment/Exercise - 12/06/20 0805      Exercises   Exercises Shoulder      Shoulder Exercises: Supine   External Rotation Right;AAROM;15 reps    External Rotation Limitations wand   review of proper technique for HEP     Shoulder Exercises: Standing   External Rotation Right;10 reps;Strengthening;Theraband;AROM    Theraband Level (Shoulder External Rotation) Level 2 (Red)    External Rotation Limitations neutral shoulder with towel roll under elbow + cues for scap retraction    Internal Rotation Right;10 reps;Strengthening;Theraband;AROM    Theraband Level (Shoulder Internal Rotation) Level 2 (Red)    Internal Rotation Limitations neutral shoulder with towel roll under elbow - cues to avoid rounding shoulder fwd    Row Both;10 reps;Strengthening;Theraband    Theraband Level (Shoulder Row) Level 2 (Red)    Row Limitations cues for scap retraction + depression    Retraction Both;10 reps;Strengthening;Theraband    Theraband Level (Shoulder Retraction) Level 2 (Red)    Retraction Limitations + depression with shoulder extension to neutral      Shoulder Exercises: Pulleys   Flexion 3 minutes    Scaption 3 minutes      Manual Therapy   Manual Therapy Soft tissue mobilization;Myofascial release;Passive ROM    Soft tissue mobilization STM/DTM to R pec, deltoid complex, posterior/inferior shoulder    Myofascial Release manual TPR to R pecs & anterolateral deltoid    Passive ROM R shoulder PROM & MWM                  PT Education - 12/06/20 0840    Education Details Role of DN    Person(s) Educated Patient    Methods  Explanation;Handout    Comprehension Verbalized understanding            PT Short Term Goals - 12/06/20 0814      PT SHORT TERM GOAL #  1   Title Patient will be independent with initial HEP    Status Achieved   10/13/20     PT SHORT TERM GOAL #2   Title Patient to improve R shoulder PROM to [facility] without pain provocation    Status Partially Met   12/06/20 - met except ER   Target Date 11/15/20             PT Long Term Goals - 12/06/20 0814      PT LONG TERM GOAL #1   Title Patient will be independent with ongoing/advanced HEP for self-management at home    Status Partially Met    Target Date 12/27/20      PT LONG TERM GOAL #2   Title Patient to improve R shoulder AROM to [facility] without pain provocation    Status Partially Met    Target Date 12/27/20      PT LONG TERM GOAL #3   Title Patient will demonstrate improved R shoulder strength to >/= 4/5 to 4+/5 for functional UE use    Status Partially Met    Target Date 12/27/20      PT LONG TERM GOAL #4   Title Patient to demonstrate R shoulder elevation to overhead shelf with 5# with good scapular mechanics and no evidence of compensations    Status On-going    Target Date 12/27/20      PT LONG TERM GOAL #5   Title Patient to report ability to perform ADLs, household, and work-related tasks without limitation due to R shoulder pain, LOM or weakness    Status Partially Met    Target Date 12/27/20                 Plan - 12/06/20 0814    Clinical Impression Statement Matthew [name] arrives to PT in a L wrist splint, reporting a new ligamentous injury and avulsion fracture which he apparently sustained in October but only recently had evaluated. He is returning to PT for 1st time in 2 weeks again today, having reduced his frequency to 1x/wk due to insurance copay and missing last week due inclement weather. He reports good/fair compliance with HEP, averaging ~3/wk but only using yellow TB for all exercises rather than red TB for  scap retraction/rows as instructed. All strengthening exercises progressed to red TB today with good tolerance. R shoulder A/PROM continues to improve but remains limited by some substitution and increased muscle tension/tightness, particularly in anterior shoulder, which may benefit from trial of DN. R shoulder strength varying from 4-/5 with ER and 4/5 with abduction to 4+/5 with flexion & IR for available ROM allowing for increased functional use of L shoulder. Majority of goals at least partially met but Matthew [name] will continue to benefit from skilled PT to maximize functional ROM and strength of R shoulder to allow normal use of R UE with daily tasks.    Comorbidities R RCR 09/15/20; L RCR 05/14/19, HTN, hx skin CA, anxiety, TKA 2012, R THA 2016, L TKA 2010, L5-S1 fusion 2010    Rehab Potential Good    PT Frequency 2x / week    PT Duration 12 weeks    PT Treatment/Interventions ADLs/Self Care Home Management;Cryotherapy;Electrical Stimulation;Moist Heat;Therapeutic exercise;Therapeutic activities;Functional mobility training;Ultrasound;Neuromuscular re-education;Patient/family education;Manual techniques;Vasopneumatic Device;Taping;Splinting;Energy conservation;Dry needling;Passive range of motion;Scar mobilization    PT Next Visit Plan Per R shoulder RTC repair protocol (surgery 09/15/20) - post-op week #12 as of 12/08/20; potential DN for increased muscle tension in anterior R shoulder  Consulted and Agree with Plan of Care Patient           Patient will benefit from skilled therapeutic intervention in order to improve the following deficits and impairments:  Decreased activity tolerance,Decreased range of motion,Decreased safety awareness,Decreased scar mobility,Decreased strength,Hypomobility,Increased edema,Increased fascial restricitons,Increased muscle spasms,Impaired perceived functional ability,Impaired flexibility,Impaired UE functional use,Improper body mechanics,Postural  dysfunction,Pain  Visit Diagnosis: Acute pain of right shoulder  Stiffness of right shoulder, not elsewhere classified  Muscle weakness (generalized)     Problem List Patient Active Problem List   Diagnosis Date Noted  . Primary open angle glaucoma of both eyes, mild stage 11/23/2020  . Right epiretinal membrane 03/14/2020  . Cystoid macular edema of right eye 03/14/2020  . Nuclear sclerotic cataract of left eye 03/14/2020  . Pseudophakia 03/14/2020  . Snoring 03/14/2020  . Essential hypertension 12/24/2019  . Lumbar stenosis with neurogenic claudication 06/18/2019  . Lumbar facet arthropathy 10/30/2017  . Osteoarthritis of right hip 09/09/2015  . Status post total replacement of right hip 09/09/2015    Percival Spanish, PT, MPT 12/06/2020, 10:55 AM  [facility] [address]  [place], [place], [address] Phone: [phone]   Fax:  [phone]  Name: Matthew [name] MRN: [id number] Date of Birth: 05-28-1956   PHYSICAL THERAPY DISCHARGE SUMMARY  Visits from Start of Care: 10  Current functional level related to goals / functional outcomes:   Refer to above clinical impression for status as of last visit on 1/25/222. Patient cancelled all remaining appointments stating MD released him from PT, therefore will proceed with discharge from PT for this episode.   Remaining deficits:   As above.   Education / Equipment:   HEP  Plan: Patient agrees to discharge.  Patient goals were partially met. Patient is being discharged due to the patient's request.  ?????    Percival Spanish, PT, MPT 01/27/21, 11:01 AM  [facility] [address]  [place], [place], [address] Phone: [phone]   Fax:  [phone]

## 2020-12-13 ENCOUNTER — Encounter: Payer: Self-pay | Admitting: Orthopaedic Surgery

## 2020-12-13 ENCOUNTER — Ambulatory Visit (INDEPENDENT_AMBULATORY_CARE_PROVIDER_SITE_OTHER): Payer: Medicare Other | Admitting: Orthopaedic Surgery

## 2020-12-13 DIAGNOSIS — Z9889 Other specified postprocedural states: Secondary | ICD-10-CM

## 2020-12-13 NOTE — Progress Notes (Signed)
The patient is now almost 3 months status post a right shoulder rotator cuff repair.  He is 65 years old.  He has been making progress through physical therapy and feels like he is getting there.  He is actually been playing some pickle ball, with his left hand.  On exam he is improved in terms of the range of motion of his right shoulder.  He is still using some of his deltoids to abduct that shoulder but his clinical exam is significant improved from the last time I saw him.  He still lacks full external rotation but again I feel no grinding in the shoulder.  He understands this is a process that can take 5 to 6 months to have the best results.  He will continue his outpatient physical therapy and then transition to home exercise program.  I will see him back in 3 months for repeat exam.  If he is still having problems with his left hip, he will let us know we would obtain new x-rays of his left hip.  We have replaced his right hip.  All questions and concerns were answered and addressed.

## 2020-12-14 ENCOUNTER — Ambulatory Visit: Payer: Medicare Other | Admitting: Physical Therapy

## 2020-12-20 ENCOUNTER — Ambulatory Visit: Payer: Medicare Other | Admitting: Physical Therapy

## 2020-12-23 ENCOUNTER — Telehealth: Payer: Self-pay | Admitting: Orthopaedic Surgery

## 2020-12-23 MED ORDER — TIZANIDINE HCL 4 MG PO TABS: 4.0000 mg | ORAL_TABLET | Freq: Three times a day (TID) | ORAL | 0 refills | Status: DC | PRN

## 2020-12-23 NOTE — Telephone Encounter (Signed)
Patient called requesting  Muscle relaxer medication. Patient states he hit his left shoulder on wall and twisted his back. Please send to pharmacy on file. Call patient when medication has been sent in. Patient phone number is [phone].

## 2020-12-27 ENCOUNTER — Encounter: Payer: Self-pay | Admitting: Orthopaedic Surgery

## 2020-12-27 ENCOUNTER — Encounter: Payer: Medicare Other | Admitting: Physical Therapy

## 2020-12-28 ENCOUNTER — Ambulatory Visit (INDEPENDENT_AMBULATORY_CARE_PROVIDER_SITE_OTHER): Payer: Medicare Other | Admitting: Orthopaedic Surgery

## 2020-12-28 ENCOUNTER — Ambulatory Visit: Payer: Self-pay

## 2020-12-28 DIAGNOSIS — M549 Dorsalgia, unspecified: Secondary | ICD-10-CM | POA: Diagnosis not present

## 2020-12-28 MED ORDER — METHYLPREDNISOLONE 4 MG PO TABS
ORAL_TABLET | ORAL | 0 refills | Status: DC
Start: 2020-12-28 — End: 2021-09-12

## 2020-12-28 MED ORDER — OXYCODONE HCL 5 MG PO TABS
5.0000 mg | ORAL_TABLET | Freq: Four times a day (QID) | ORAL | 0 refills | Status: DC | PRN
Start: 2020-12-28 — End: 2024-06-25

## 2020-12-28 MED ORDER — TIZANIDINE HCL 4 MG PO TABS: 4.0000 mg | ORAL_TABLET | Freq: Three times a day (TID) | ORAL | 0 refills | Status: DC | PRN

## 2020-12-28 NOTE — Progress Notes (Signed)
The patient comes in after injuring his thoracic spine and the right side of his mid back last week playing pickle ball.  He has been having a lot of pain when he lays down and first gets up and rolls over in bed as well.  He denies any shortness of breath or breathing issues.  He is 65 years old.  He is also been in recovery 3 months out from her right shoulder rotator cuff surgery.  On exam he does have pain to palpation mid thoracic spine area to the left side.  There is no pain to the midline of the spine.  An AP and lateral thoracic spine show no acute findings.  This certainly could be a posterior rib fracture as well.  The main treatment is going to be activity modification and time with return to contact sports as he feels more comfortable.  He did wish for a short-term limited prescription of oxycodone and refill on his Zanaflex.  Also send in a steroid taper.  All questions and concerns were answered and addressed.  I will see him back in 3 months for follow-up from his right shoulder.

## 2021-01-10 ENCOUNTER — Encounter: Payer: Self-pay | Admitting: Emergency Medicine

## 2021-01-10 DIAGNOSIS — R972 Elevated prostate specific antigen [PSA]: Secondary | ICD-10-CM

## 2021-01-11 ENCOUNTER — Other Ambulatory Visit: Payer: Self-pay | Admitting: Emergency Medicine

## 2021-01-12 LAB — PSA, TOTAL AND FREE
PSA, Free Pct: 17.6 %
PSA, Free: 0.58 ng/mL
Prostate Specific Ag, Serum: 3.3 ng/mL (ref 0.0–4.0)

## 2021-02-17 IMAGING — MR MRI LUMBAR SPINE WITHOUT AND WITH CONTRAST
4 of 7 series · 27 of 48 positions shown · IV contrast (multihance)
Comparison: MRI lumbar spine 12/01/2016

CLINICAL DATA: Spinal stenosis of the lumbar region without
neurogenic claudication.

Creatinine was obtained on site at [HOSPITAL] at [HOSPITAL].
Results: Creatinine 0.9 mg/dL.
EXAM:
MRI LUMBAR SPINE WITHOUT AND WITH CONTRAST
TECHNIQUE: Multiplanar and multiecho pulse sequences of the lumbar spine were
obtained without and with intravenous contrast.
CONTRAST:  20mL MULTIHANCE GADOBENATE DIMEGLUMINE 529 MG/ML IV SOLN

[Series 4: T1 · sagittal · 4.0mm · 0.55mm/px · 4 of 15 slices shown (1 of 2)]
[im 1/15]
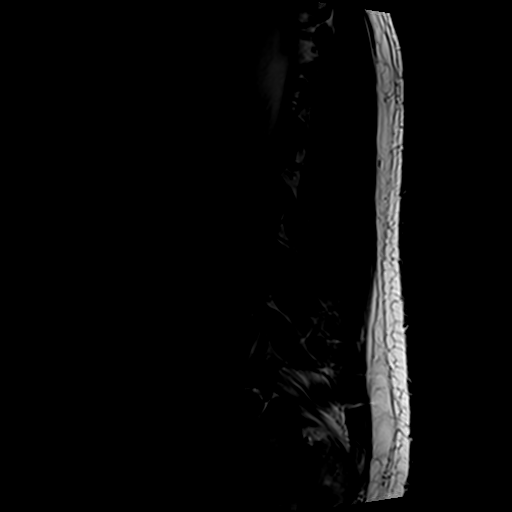
[im 5/15]
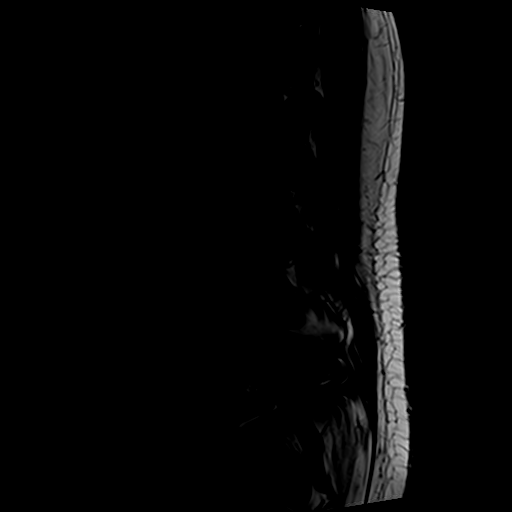
[im 10/15]
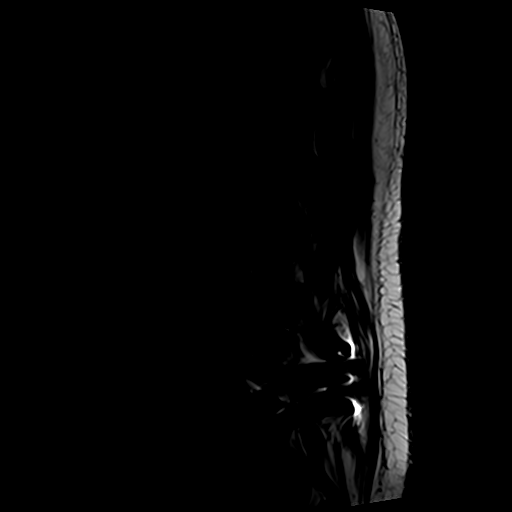
[im 15/15]
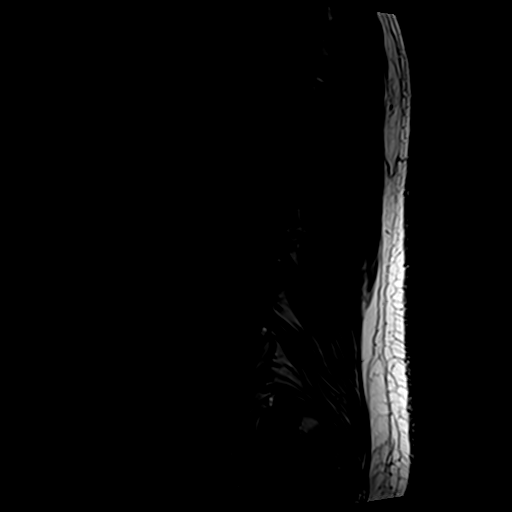

[Series 5: T1 · axial · 4.0mm · 0.35mm/px · z∈[-169,+28]mm · 8 of 39 slices shown (2 of 2)]
[im 1/39]
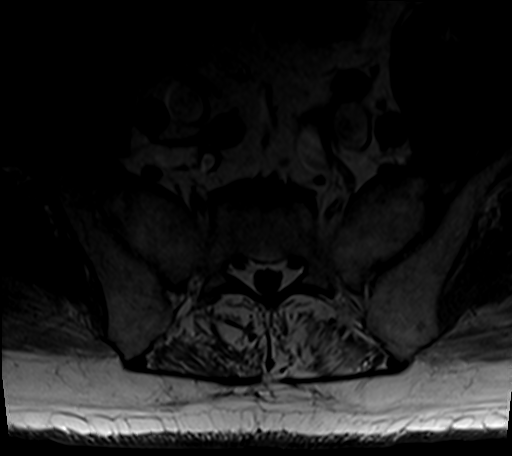
[im 4/39]
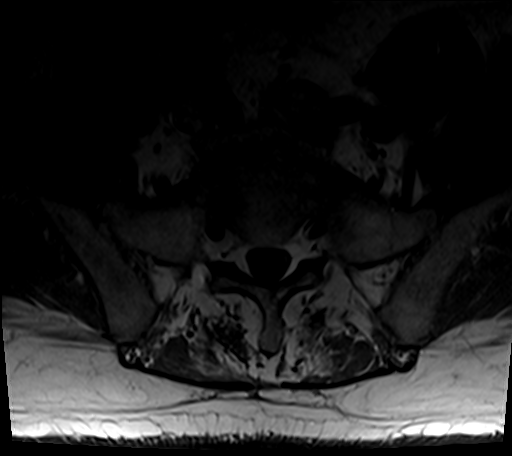
[im 8/39]
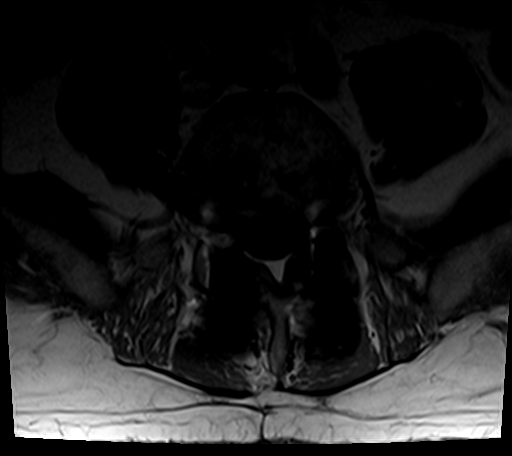
[im 12/39]
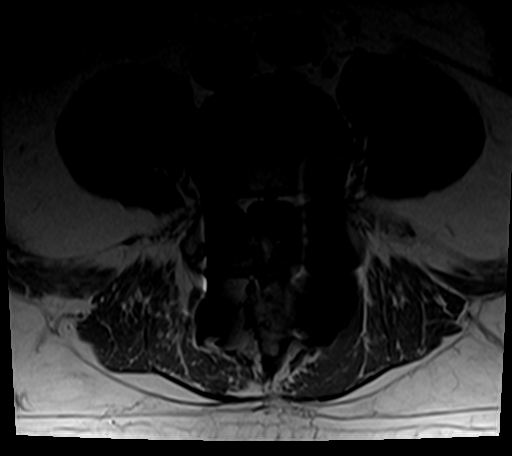
[im 16/39]
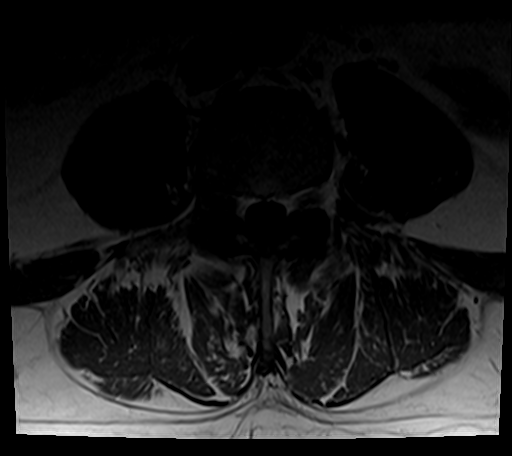
[im 20/39]
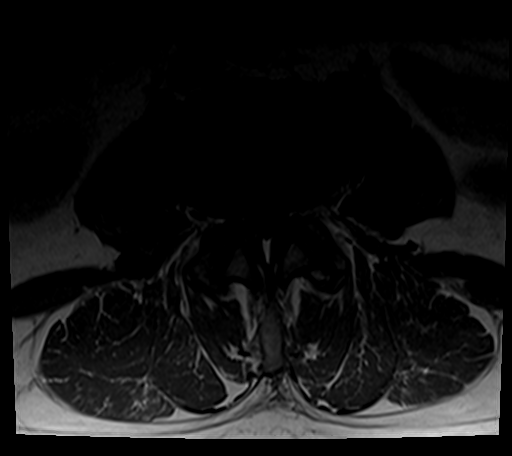
[im 23/39]
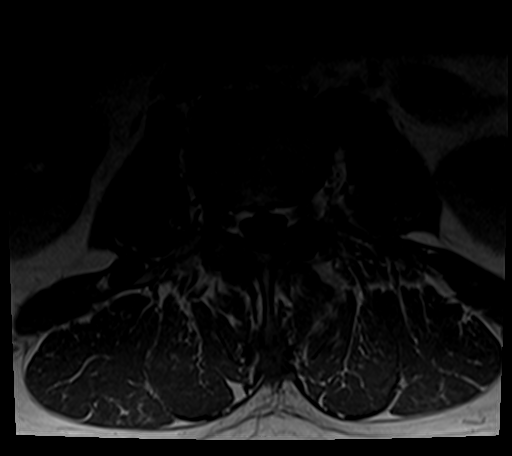
[im 35/39]
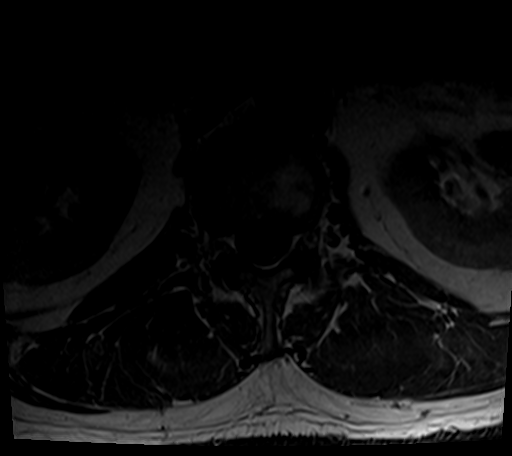

[Series 6: T2 · axial · 4.0mm · 0.70mm/px · z∈[-169,+62]mm · 11 of 39 slices shown]
[im 1/39]
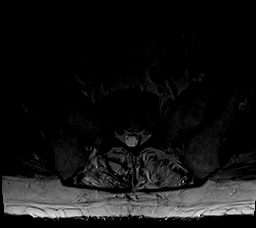
[im 4/39]
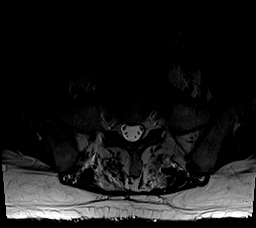
[im 8/39]
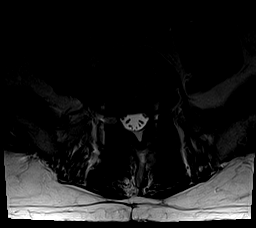
[im 12/39]
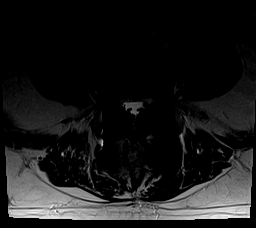
[im 16/39]
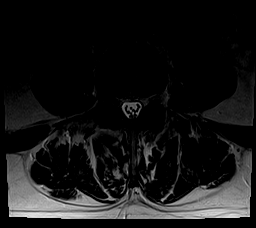
[im 20/39]
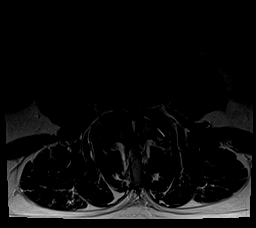
[im 23/39]
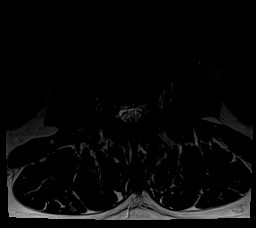
[im 27/39]
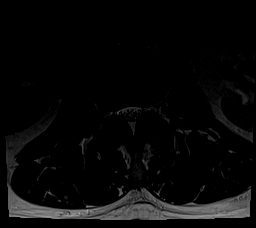
[im 31/39]
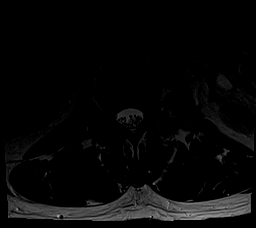
[im 35/39]
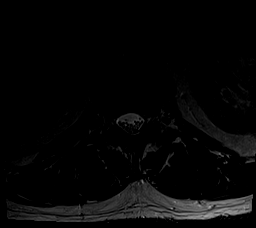
[im 39/39]
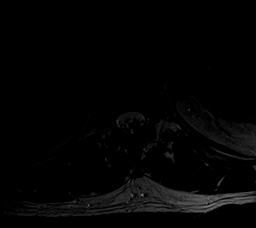

[Series 7: T2 post-contrast · sagittal · 4.0mm · 0.55mm/px · 4 of 15 slices shown]
[im 1/15]
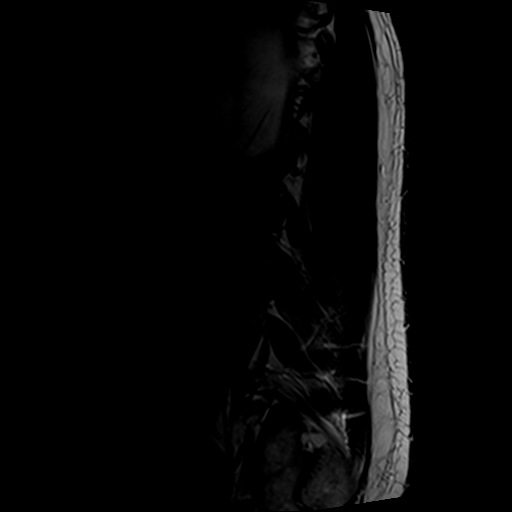
[im 5/15]
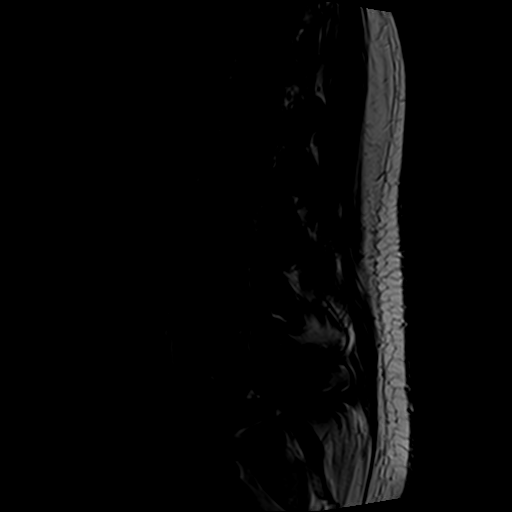
[im 10/15]
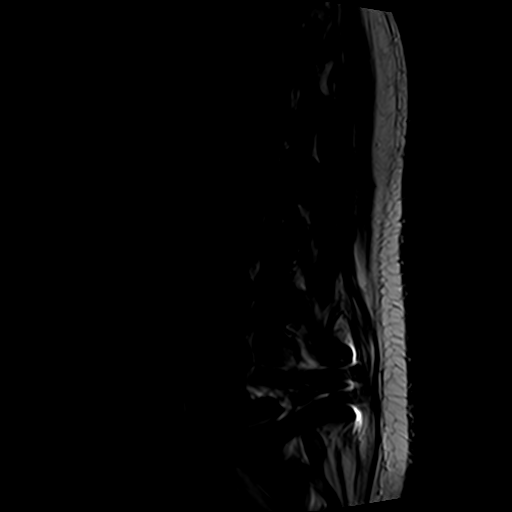
[im 15/15]
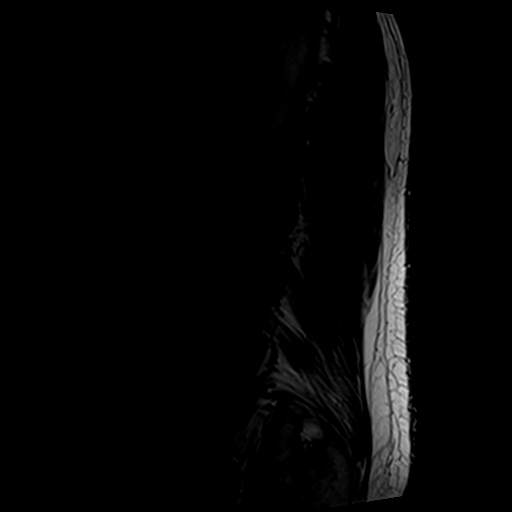

[27 of 48 positions shown; findings below may reference images not displayed]

FINDINGS: Segmentation: 5 non rib-bearing lumbar type vertebral bodies are
present. The lowest fully formed vertebral body is L5.

Alignment: Slight degenerative anterolisthesis at L3-4 is minimally
more prominent than on the prior study. AP alignment is otherwise
anatomic.

Vertebrae: Chronic endplate changes are present at the fused level,
L5-S1. Marrow signal and vertebral body heights are otherwise
normal.

Conus medullaris and cauda equina: Conus extends to the L1. Level.
Conus and cauda equina appear normal.

Paraspinal and other soft tissues: Limited imaging the abdomen is
unremarkable. There is no significant adenopathy. No solid organ
lesions are present.

Disc levels:

T12-L1: Mild facet hypertrophy is present. There is no significant
stenosis.

L1-2: Mild disc bulging is present. There is no significant
stenosis. Bilateral facet hypertrophy is noted.

L2-3: Mild disc bulging and facet hypertrophy is stable. There is no
significant stenosis.

L3-4: Adjacent level disease has progressed. A right lateral disc
extrusion is present. There is progressive moderate right
subarticular and foraminal stenosis. Mild left subarticular and
foraminal stenosis is stable.

L4-5: Advanced facet hypertrophy is present. A broad-based disc
bulge is noted. Mild foraminal narrowing bilaterally is stable.

L5-S1: Laminectomy and fusion are stable. No residual or recurrent
stenosis is present.

Expected postsurgical enhancement is present at L5-S1. No pathologic
enhancement is present.
IMPRESSION: 1. Fusion laminectomy at L5-S1 without significant residual or
recurrent stenosis.
2. Advanced facet hypertrophy and broad-based disc bulge at L4-5
without significant stenosis.
3. Progressive disease at L3-4 with moderate right subarticular and
foraminal stenosis.
4. Stable appearance of mild left subarticular and foraminal
stenosis at L3-4.
5. Mild disc bulging and facet disease at L1-2 and L2-3 without
significant stenosis or change.

## 2021-03-13 ENCOUNTER — Ambulatory Visit: Payer: Medicare Other | Admitting: Orthopaedic Surgery

## 2021-08-24 ENCOUNTER — Encounter: Payer: Self-pay | Admitting: Emergency Medicine

## 2021-08-31 ENCOUNTER — Encounter: Payer: Medicare Other | Admitting: Emergency Medicine

## 2021-09-12 ENCOUNTER — Other Ambulatory Visit: Payer: Self-pay

## 2021-09-12 ENCOUNTER — Encounter: Payer: Self-pay | Admitting: Emergency Medicine

## 2021-09-12 ENCOUNTER — Ambulatory Visit (INDEPENDENT_AMBULATORY_CARE_PROVIDER_SITE_OTHER): Payer: Medicare Other | Admitting: Emergency Medicine

## 2021-09-12 VITALS — BP 130/70 | HR 75 | Ht 67.0 in | Wt 234.0 lb

## 2021-09-12 DIAGNOSIS — Z0000 Encounter for general adult medical examination without abnormal findings: Secondary | ICD-10-CM | POA: Diagnosis not present

## 2021-09-12 DIAGNOSIS — E785 Hyperlipidemia, unspecified: Secondary | ICD-10-CM

## 2021-09-12 DIAGNOSIS — Z8042 Family history of malignant neoplasm of prostate: Secondary | ICD-10-CM

## 2021-09-12 DIAGNOSIS — Z87438 Personal history of other diseases of male genital organs: Secondary | ICD-10-CM | POA: Diagnosis not present

## 2021-09-12 DIAGNOSIS — R7303 Prediabetes: Secondary | ICD-10-CM | POA: Diagnosis not present

## 2021-09-12 DIAGNOSIS — I10 Essential (primary) hypertension: Secondary | ICD-10-CM | POA: Diagnosis not present

## 2021-09-12 DIAGNOSIS — Z130 Encounter for screening for diseases of the blood and blood-forming organs and certain disorders involving the immune mechanism: Secondary | ICD-10-CM

## 2021-09-12 MED ORDER — LISINOPRIL 20 MG PO TABS: 20.0000 mg | ORAL_TABLET | Freq: Every day | ORAL | 3 refills | Status: DC

## 2021-09-12 MED ORDER — ROSUVASTATIN CALCIUM 10 MG PO TABS: 10.0000 mg | ORAL_TABLET | Freq: Every day | ORAL | 3 refills | Status: DC

## 2021-09-12 NOTE — Progress Notes (Signed)
The 10-year ASCVD risk score (Arnett DK, et al., 2019) is: 18.6%   Values used to calculate the score:     Age: 65 years     Sex: Male     Is Non-Hispanic African American: No     Diabetic: No     Tobacco smoker: No     Systolic Blood Pressure: 540 mmHg     Is BP treated: Yes     HDL Cholesterol: 55 mg/dL     Total Cholesterol: 212 mg/dL Matthew [name] 65 y.o.   Chief Complaint  Patient presents with   Annual Exam    HISTORY OF PRESENT ILLNESS: This is a 65 y.o. male A1A here for annual exam. History of hypertension on lisinopril. Doing well.  Has no complaints or medical concerns today. Recent home medical evaluation showed mild carotid artery plaque formation, no atrial fibrillation, no abdominal aneurysm, elevated blood glucose at 101, normal PSA, elevated LDL with 10-year cardiovascular risk at 20%, no peripheral vascular disease. Has history of elevated PSA but has been normal lately.  However has family history of prostate cancer.  Needs urology referral.  HPI   Prior to Admission medications   Medication Sig Start Date End Date Taking? Authorizing Provider  ALPRAZolam Duanne Moron) 1 MG tablet Take 1 tablet (1 mg total) by mouth at bedtime as needed for sleep. 06/03/20  Yes Leandrew Koyanagi, MD  aspirin EC 325 MG EC tablet Take 1 tablet (325 mg total) by mouth 2 (two) times daily after a meal. 09/10/15  Yes Mcarthur Rossetti, MD  Coenzyme Q10 (COQ10 PO) Take 300 mg by mouth daily.   Yes [provider]  diazepam (VALIUM) 5 MG tablet TAKE ONE TAB ONE HOUR PRIOR TO MRI REPEAT AS NEEDED #2 . ZERO REFILLS 03/16/19  Yes Pete Pelt, PA-C  Glucosamine-Chondroitin-MSM (GLUCOSAMINE CHONDROIT MSM DS) TABS Take 1,500 mg by mouth daily.   Yes [provider]  ketorolac (ACULAR) 0.5 % ophthalmic solution Place 1 drop into the right eye 4 (four) times daily.  02/01/20  Yes [provider]  latanoprost (XALATAN) 0.005 % ophthalmic solution Place 1 drop into  both eyes at bedtime.   Yes [provider]  lisinopril (ZESTRIL) 20 MG tablet Take 1 tablet (20 mg total) by mouth daily. 08/18/20  Yes [place], Ines Bloomer, MD  OVER THE COUNTER MEDICATION    Yes [provider]  oxyCODONE (ROXICODONE) 5 MG immediate release tablet Take 1 tablet (5 mg total) by mouth every 6 (six) hours as needed for severe pain. 12/28/20  Yes Mcarthur Rossetti, MD  VITAMIN D PO Take by mouth daily.   Yes [provider]  hydrochlorothiazide (HYDRODIURIL) 25 MG tablet Take 25 mg by mouth daily.     [provider]  methylPREDNISolone (MEDROL) 4 MG tablet Medrol dose pack. Take as instructed 12/28/20   Mcarthur Rossetti, MD  rosuvastatin (CRESTOR) 10 MG tablet Take 1 tablet (10 mg total) by mouth daily. 09/12/21  Yes Iriel Nason, Ines Bloomer, MD  tiZANidine (ZANAFLEX) 4 MG tablet Take 1 tablet (4 mg total) by mouth every 8 (eight) hours as needed for muscle spasms. 12/28/20   Mcarthur Rossetti, MD    No Known Allergies  Patient Active Problem List   Diagnosis Date Noted   Primary open angle glaucoma of both eyes, mild stage 11/23/2020   Right epiretinal membrane 03/14/2020   Cystoid macular edema of right eye 03/14/2020   Nuclear sclerotic cataract of  left eye 03/14/2020   Pseudophakia 03/14/2020   Snoring 03/14/2020   Essential hypertension 12/24/2019   Lumbar stenosis with neurogenic claudication 06/18/2019   Lumbar radiculopathy 10/30/2017   Osteoarthritis of right hip 09/09/2015   Status post total replacement of right hip 09/09/2015    Past Medical History:  Diagnosis Date   Anxiety    Arthritis    KNEES BACK SHOULDERS   Cancer ([place])    HX SKIN CANCER   Cataracts, bilateral    Heart murmur    "nothing to be concerned with, I ve had it all my life."   Hx of nonmelanoma skin cancer    Hypertension    FOLLOWED BY DR ED GREEN    Past Surgical History:  Procedure Laterality Date   ANTERIOR LAT LUMBAR FUSION  Left 06/18/2019   Procedure: Left Lumbar three-four Anterolateral lumbar decompression/interbody fusion with lateral plate fixation;  Surgeon: Kristeen Miss, MD;  Location: [facility];  Service: Neurosurgery;  Laterality: Left;   BACK SURGERY  2010   L5S1 FUSION   COLONOSCOPY  05/13/2019   EYE SURGERY  10/2019   per patient retina   JOINT REPLACEMENT N/A    [employer] 08/16/2020   KNEE ARTHROPLASTY  2010   L   KNEE ARTHROSCOPY     L X 2   REPLACEMENT TOTAL KNEE Left 11/18/2008   SHOULDER ARTHROSCOPY Left 05/14/2019   SPINE SURGERY N/A    [employer] 08/16/2020   TOTAL HIP ARTHROPLASTY Right 09/09/2015   Procedure: RIGHT TOTAL HIP ARTHROPLASTY ANTERIOR APPROACH;  Surgeon: Mcarthur Rossetti, MD;  Location: [facility];  Service: Orthopedics;  Laterality: Right;   TOTAL KNEE ARTHROPLASTY  10/08/2011   Procedure: rightTOTAL KNEE ARTHROPLASTY;  Surgeon: Rudean Haskell, MD;  Location: [facility];  Service: Orthopedics;  Laterality: Right;  TOTAL RIGHT KNEE ARTHROPLASTY     Social History   Socioeconomic History   Marital status: Divorced    Spouse name: Not on file   Number of children: Not on file   Years of education: Not on file   Highest education level: Not on file  Occupational History   Not on file  Tobacco Use   Smoking status: Never   Smokeless tobacco: Never  Vaping Use   Vaping Use: Never used  Substance and Sexual Activity   Alcohol use: Yes    Alcohol/week: 2.0 standard drinks    Types: 2 Cans of beer per week    Comment: OCCASIONAL-wine, beer,mixed   Drug use: No   Sexual activity: Not on file  Other Topics Concern   Not on file  Social History Narrative   Not on file   Social Determinants of Health   Financial Resource Strain: Not on file  Food Insecurity: Not on file  Transportation Needs: Not on file  Physical Activity: Not on file  Stress: Not on file  Social Connections: Not on file  Intimate Partner Violence: Not on file    Family History  Problem Relation  Age of Onset   Heart attack Brother        76   Hypertension Brother    Stroke Brother    Heart attack Paternal Grandfather        5 bypass surgeries   Prostate cancer Paternal Grandfather    Cancer Father        died of bladder cancer, prostate   Cancer Maternal Grandmother    Hypertension Brother      Review of Systems  Constitutional: Negative.  Negative for chills and fever.  HENT: Negative.  Negative for congestion and sore throat.   Respiratory: Negative.  Negative for cough and shortness of breath.   Cardiovascular: Negative.  Negative for chest pain and palpitations.  Gastrointestinal: Negative.  Negative for abdominal pain, diarrhea, nausea and vomiting.  Genitourinary: Negative.  Negative for dysuria and hematuria.  Musculoskeletal: Negative.   Skin: Negative.  Negative for rash.  Neurological:  Negative for dizziness and headaches.  All other systems reviewed and are negative.  Today's Vitals   09/12/21 0945  BP: (!) 142/82  Pulse: 75  SpO2: 95%  Weight: 234 lb (106.1 kg)  Height: 5\' 7"  (1.702 m)   Body mass index is 36.65 kg/m.  Physical Exam Vitals reviewed.  Constitutional:      Appearance: Normal appearance.  HENT:     Head: Normocephalic.     Right Ear: Tympanic membrane, ear canal and external ear normal.     Left Ear: Tympanic membrane, ear canal and external ear normal.     Mouth/Throat:     Mouth: Mucous membranes are moist.     Pharynx: Oropharynx is clear.  Eyes:     Extraocular Movements: Extraocular movements intact.     Conjunctiva/sclera: Conjunctivae normal.     Pupils: Pupils are equal, round, and reactive to light.  Cardiovascular:     Rate and Rhythm: Normal rate and regular rhythm.     Pulses: Normal pulses.     Heart sounds: Normal heart sounds.  Pulmonary:     Effort: Pulmonary effort is normal.     Breath sounds: Normal breath sounds.  Abdominal:     General: Bowel sounds are normal. There is no distension.      Palpations: Abdomen is soft.     Tenderness: There is no abdominal tenderness.  Musculoskeletal:        General: Normal range of motion.     Cervical back: Normal range of motion and neck supple. No tenderness.  Lymphadenopathy:     Cervical: No cervical adenopathy.  Skin:    General: Skin is warm and dry.     Capillary Refill: Capillary refill takes less than 2 seconds.  Neurological:     General: No focal deficit present.     Mental Status: He is alert and oriented to person, place, and time.  Psychiatric:        Mood and Affect: Mood normal.        Behavior: Behavior normal.     ASSESSMENT & PLAN: Problem List Items Addressed This Visit       Cardiovascular and Mediastinum   Essential hypertension   Relevant Medications   rosuvastatin (CRESTOR) 10 MG tablet   lisinopril (ZESTRIL) 20 MG tablet   Other Relevant Orders   Comprehensive metabolic panel   Other Visit Diagnoses     Routine general medical examination at a health care facility    -  Primary   Dyslipidemia       Relevant Medications   rosuvastatin (CRESTOR) 10 MG tablet   Other Relevant Orders   Lipid panel   History of BPH       Relevant Orders   Ambulatory referral to Urology   Family history of prostate cancer       Relevant Orders   Ambulatory referral to Urology   Prediabetes       Relevant Orders   Hemoglobin A1c   Screening for deficiency anemia       Relevant Orders  CBC with Differential      Modifiable risk factors discussed with patient. Anticipatory guidance according to age provided. The following topics were also discussed: Social Determinants of Health Smoking.  Smokes cigars. Diet and nutrition and need to decrease amount of daily carbohydrate intake Benefits of exercise Cancer screening and need for repeat colonoscopy.  Last colonoscopy about 5 years ago showed polyps. Vaccinations.  Patient does not believe in vaccines.  Not requesting any vaccinations. Cardiovascular risk  assessment and need to start cholesterol medication with rosuvastatin 10 mg daily The 10-year ASCVD risk score (Arnett DK, et al., 2019) is: 16.1%   Values used to calculate the score:     Age: 50 years     Sex: Male     Is Non-Hispanic African American: No     Diabetic: No     Tobacco smoker: No     Systolic Blood Pressure: 756 mmHg     Is BP treated: Yes     HDL Cholesterol: 55 mg/dL     Total Cholesterol: 212 mg/dL Need for urology consultation.  Patient has history of elevated PSA and family history of prostate cancer. Urology referral placed today. Mental health including depression and anxiety Fall and accident prevention  Patient Instructions  Health Maintenance After Age 74 After age 34, you are at a higher risk for certain long-term diseases and infections as well as injuries from falls. Falls are a major cause of broken bones and head injuries in people who are older than age 45. Getting regular preventive care can help to keep you healthy and well. Preventive care includes getting regular testing and making lifestyle changes as recommended by your health care provider. Talk with your health care provider about: Which screenings and tests you should have. A screening is a test that checks for a disease when you have no symptoms. A diet and exercise plan that is right for you. What should I know about screenings and tests to prevent falls? Screening and testing are the best ways to find a health problem early. Early diagnosis and treatment give you the best chance of managing medical conditions that are common after age 58. Certain conditions and lifestyle choices may make you more likely to have a fall. Your health care provider may recommend: Regular vision checks. Poor vision and conditions such as cataracts can make you more likely to have a fall. If you wear glasses, make sure to get your prescription updated if your vision changes. Medicine review. Work with your health care  provider to regularly review all of the medicines you are taking, including over-the-counter medicines. Ask your health care provider about any side effects that may make you more likely to have a fall. Tell your health care provider if any medicines that you take make you feel dizzy or sleepy. Osteoporosis screening. Osteoporosis is a condition that causes the bones to get weaker. This can make the bones weak and cause them to break more easily. Blood pressure screening. Blood pressure changes and medicines to control blood pressure can make you feel dizzy. Strength and balance checks. Your health care provider may recommend certain tests to check your strength and balance while standing, walking, or changing positions. Foot health exam. Foot pain and numbness, as well as not wearing proper footwear, can make you more likely to have a fall. Depression screening. You may be more likely to have a fall if you have a fear of falling, feel emotionally low, or feel unable to do  activities that you used to do. Alcohol use screening. Using too much alcohol can affect your balance and may make you more likely to have a fall. What actions can I take to lower my risk of falls? General instructions Talk with your health care provider about your risks for falling. Tell your health care provider if: You fall. Be sure to tell your health care provider about all falls, even ones that seem minor. You feel dizzy, sleepy, or off-balance. Take over-the-counter and prescription medicines only as told by your health care provider. These include any supplements. Eat a healthy diet and maintain a healthy weight. A healthy diet includes low-fat dairy products, low-fat (lean) meats, and fiber from whole grains, beans, and lots of fruits and vegetables. Home safety Remove any tripping hazards, such as rugs, cords, and clutter. Install safety equipment such as grab bars in bathrooms and safety rails on stairs. Keep rooms and  walkways well-lit. Activity  Follow a regular exercise program to stay fit. This will help you maintain your balance. Ask your health care provider what types of exercise are appropriate for you. If you need a cane or walker, use it as recommended by your health care provider. Wear supportive shoes that have nonskid soles. Lifestyle Do not drink alcohol if your health care provider tells you not to drink. If you drink alcohol, limit how much you have: 0-1 drink a day for women. 0-2 drinks a day for men. Be aware of how much alcohol is in your drink. In the [place] one drink equals one typical bottle of beer (12 oz), one-half glass of wine (5 oz), or one shot of hard liquor (1 oz). Do not use any products that contain nicotine or tobacco, such as cigarettes and e-cigarettes. If you need help quitting, ask your health care provider. Summary Having a healthy lifestyle and getting preventive care can help to protect your health and wellness after age 62. Screening and testing are the best way to find a health problem early and help you avoid having a fall. Early diagnosis and treatment give you the best chance for managing medical conditions that are more common for people who are older than age 35. Falls are a major cause of broken bones and head injuries in people who are older than age 33. Take precautions to prevent a fall at home. Work with your health care provider to learn what changes you can make to improve your health and wellness and to prevent falls. This information is not intended to replace advice given to you by your health care provider. Make sure you discuss any questions you have with your health care provider. Document Revised: 01/06/2021 Document Reviewed: 10/14/2020 Elsevier Patient Education  2022 [place], [place] [facility]

## 2021-09-12 NOTE — Patient Instructions (Signed)

## 2021-11-09 DIAGNOSIS — L821 Other seborrheic keratosis: Secondary | ICD-10-CM | POA: Diagnosis not present

## 2021-11-09 DIAGNOSIS — Z85828 Personal history of other malignant neoplasm of skin: Secondary | ICD-10-CM | POA: Diagnosis not present

## 2021-11-09 DIAGNOSIS — L918 Other hypertrophic disorders of the skin: Secondary | ICD-10-CM | POA: Diagnosis not present

## 2021-11-09 DIAGNOSIS — L570 Actinic keratosis: Secondary | ICD-10-CM | POA: Diagnosis not present

## 2021-11-09 DIAGNOSIS — D225 Melanocytic nevi of trunk: Secondary | ICD-10-CM | POA: Diagnosis not present

## 2021-11-23 ENCOUNTER — Encounter (INDEPENDENT_AMBULATORY_CARE_PROVIDER_SITE_OTHER): Payer: Medicare Other | Admitting: Ophthalmology

## 2021-12-06 DIAGNOSIS — H5213 Myopia, bilateral: Secondary | ICD-10-CM | POA: Diagnosis not present

## 2021-12-06 DIAGNOSIS — H35371 Puckering of macula, right eye: Secondary | ICD-10-CM | POA: Diagnosis not present

## 2021-12-06 DIAGNOSIS — H2512 Age-related nuclear cataract, left eye: Secondary | ICD-10-CM | POA: Diagnosis not present

## 2021-12-06 DIAGNOSIS — H40023 Open angle with borderline findings, high risk, bilateral: Secondary | ICD-10-CM | POA: Diagnosis not present

## 2021-12-07 ENCOUNTER — Encounter (INDEPENDENT_AMBULATORY_CARE_PROVIDER_SITE_OTHER): Payer: Self-pay | Admitting: Ophthalmology

## 2021-12-07 ENCOUNTER — Ambulatory Visit (INDEPENDENT_AMBULATORY_CARE_PROVIDER_SITE_OTHER): Payer: Medicare Other | Admitting: Ophthalmology

## 2021-12-07 ENCOUNTER — Other Ambulatory Visit: Payer: Self-pay

## 2021-12-07 DIAGNOSIS — H2512 Age-related nuclear cataract, left eye: Secondary | ICD-10-CM

## 2021-12-07 DIAGNOSIS — H35371 Puckering of macula, right eye: Secondary | ICD-10-CM | POA: Diagnosis not present

## 2021-12-07 NOTE — Assessment & Plan Note (Signed)
History of vitrectomy membrane peel early 2021, improved acuity stable

## 2021-12-07 NOTE — Assessment & Plan Note (Signed)
OS stable followed by Dr. Luberta Mutter

## 2021-12-07 NOTE — Progress Notes (Signed)
12/07/2021     CHIEF COMPLAINT Patient presents for  Chief Complaint  Patient presents with   Retina Follow Up    With history of vitrectomy and membrane peel OD for epiretinal membrane, no change in quality of vision over the last year  HISTORY OF PRESENT ILLNESS: Matthew [name] is a 66 y.o. male who presents to the clinic today for:   HPI     Retina Follow Up           Diagnosis: Other   Laterality: both eyes   Onset: 1 year ago   Severity: mild   Duration: 1 year   Course: stable         Comments   1 year fu ou and oct   Pt states VA OU stable since last visit. Pt denies FOL, floaters, or ocular pain OU.  Pt states, "I just saw my ophthalmologist yesterday and everything seems to be fine."  Pt reports using Latanoprost QHS OU        Last edited by Kendra Opitz, COA on 12/07/2021  8:25 AM.      Referring physician: Luberta Mutter, MD [place],  [place] [address]  HISTORICAL INFORMATION:   Selected notes from the MEDICAL RECORD NUMBER    Lab Results  Component Value Date   HGBA1C 5.7 (H) 08/18/2020     CURRENT MEDICATIONS: Current Outpatient Medications (Ophthalmic Drugs)  Medication Sig   ketorolac (ACULAR) 0.5 % ophthalmic solution Place 1 drop into the right eye 4 (four) times daily.    latanoprost (XALATAN) 0.005 % ophthalmic solution Place 1 drop into both eyes at bedtime.   No current facility-administered medications for this visit. (Ophthalmic Drugs)   Current Outpatient Medications (Other)  Medication Sig   ALPRAZolam (XANAX) 1 MG tablet Take 1 tablet (1 mg total) by mouth at bedtime as needed for sleep.   aspirin EC 325 MG EC tablet Take 1 tablet (325 mg total) by mouth 2 (two) times daily after a meal.   Coenzyme Q10 (COQ10 PO) Take 300 mg by mouth daily.   diazepam (VALIUM) 5 MG tablet TAKE ONE TAB ONE HOUR PRIOR TO MRI REPEAT AS NEEDED #2 . ZERO REFILLS   Glucosamine-Chondroitin-MSM (GLUCOSAMINE CHONDROIT MSM  DS) TABS Take 1,500 mg by mouth daily.   lisinopril (ZESTRIL) 20 MG tablet Take 1 tablet (20 mg total) by mouth daily.   OVER THE COUNTER MEDICATION    oxyCODONE (ROXICODONE) 5 MG immediate release tablet Take 1 tablet (5 mg total) by mouth every 6 (six) hours as needed for severe pain.   rosuvastatin (CRESTOR) 10 MG tablet Take 1 tablet (10 mg total) by mouth daily.   VITAMIN D PO Take by mouth daily.   No current facility-administered medications for this visit. (Other)      REVIEW OF SYSTEMS:    ALLERGIES No Known Allergies  PAST MEDICAL HISTORY Past Medical History:  Diagnosis Date   Anxiety    Arthritis    KNEES BACK SHOULDERS   Cancer ([facility])    HX SKIN CANCER   Cataracts, bilateral    Heart murmur    "nothing to be concerned with, I ve had it all my life."   Hx of nonmelanoma skin cancer    Hypertension    FOLLOWED BY DR ED GREEN   Past Surgical History:  Procedure Laterality Date   ANTERIOR LAT LUMBAR FUSION Left 06/18/2019   Procedure: Left Lumbar three-four Anterolateral lumbar decompression/interbody fusion  with lateral plate fixation;  Surgeon: Kristeen Miss, MD;  Location: [facility];  Service: Neurosurgery;  Laterality: Left;   BACK SURGERY  2010   L5S1 FUSION   COLONOSCOPY  05/13/2019   EYE SURGERY  10/2019   per patient retina   JOINT REPLACEMENT N/A    [employer]reesia 08/16/2020   KNEE ARTHROPLASTY  2010   L   KNEE ARTHROSCOPY     L X 2   REPLACEMENT TOTAL KNEE Left 11/18/2008   SHOULDER ARTHROSCOPY Left 05/14/2019   SPINE SURGERY N/A    [employer]reesia 08/16/2020   TOTAL HIP ARTHROPLASTY Right 09/09/2015   Procedure: RIGHT TOTAL HIP ARTHROPLASTY ANTERIOR APPROACH;  Surgeon: Mcarthur Rossetti, MD;  Location: [facility];  Service: Orthopedics;  Laterality: Right;   TOTAL KNEE ARTHROPLASTY  10/08/2011   Procedure: rightTOTAL KNEE ARTHROPLASTY;  Surgeon: Rudean Haskell, MD;  Location: [facility];  Service: Orthopedics;  Laterality: Right;  TOTAL RIGHT KNEE ARTHROPLASTY      FAMILY HISTORY Family History  Problem Relation Age of Onset   Heart attack Brother        27   Hypertension Brother    Stroke Brother    Heart attack Paternal Grandfather        5 bypass surgeries   Prostate cancer Paternal Grandfather    Cancer Father        died of bladder cancer, prostate   Cancer Maternal Grandmother    Hypertension Brother     SOCIAL HISTORY Social History   Tobacco Use   Smoking status: Never   Smokeless tobacco: Never  Vaping Use   Vaping Use: Never used  Substance Use Topics   Alcohol use: Yes    Alcohol/week: 2.0 standard drinks    Types: 2 Cans of beer per week    Comment: OCCASIONAL-wine, beer,mixed   Drug use: No         OPHTHALMIC EXAM:  Base Eye Exam     Visual Acuity (ETDRS)       Right Left   Dist cc 20/20 20/20    Correction: Glasses         Tonometry (Tonopen, 8:28 AM)       Right Left   Pressure 16 15         Pupils       Pupils Dark Light Shape React APD   Right PERRL 3 2 Round Slow None   Left PERRL 3 3 Round Minimal None         Visual Fields (Counting fingers)       Left Right    Full Full         Extraocular Movement       Right Left    Full, Ortho Full, Ortho         Neuro/Psych     Oriented x3: Yes   Mood/Affect: Normal         Dilation     Both eyes: 1.0% Mydriacyl, 2.5% Phenylephrine @ 8:28 AM           Slit Lamp and Fundus Exam     External Exam       Right Left   External Normal Normal         Slit Lamp Exam       Right Left   Lids/Lashes Normal Normal   Conjunctiva/Sclera White and quiet White and quiet   Cornea Clear Clear   Anterior Chamber Deep and quiet Deep and quiet   Iris  Round and reactive Round and reactive   Lens Posterior chamber intraocular lens 2+ Nuclear sclerosis   Anterior Vitreous Normal Normal         Fundus Exam       Right Left   Posterior Vitreous Clear vitrectomized Normal   Disc Normal Normal   C/D Ratio 0.5 0.4    Macula Normal, no topo distortion Normal   Vessels Normal Normal   Periphery Normal Normal            IMAGING AND PROCEDURES  Imaging and Procedures for 12/07/21  OCT, Retina - OU - Both Eyes       Right Eye Quality was good. Scan locations included subfoveal. Central Foveal Thickness: 397. Progression has been stable. Findings include abnormal foveal contour.   Left Eye Quality was good. Scan locations included subfoveal. Central Foveal Thickness: 281. Findings include normal foveal contour.   Notes OD status post vitrectomy membrane peel, early 2021.  Much improved macula anatomy OD             ASSESSMENT/PLAN:  Nuclear sclerotic cataract of left eye OS stable followed by Dr. Luberta Mutter  Right epiretinal membrane History of vitrectomy membrane peel early 2021, improved acuity stable      ICD-10-CM   1. Right epiretinal membrane  H35.371 OCT, Retina - OU - Both Eyes    2. Nuclear sclerotic cataract of left eye  H25.12       1.  2.  3.  Ophthalmic Meds Ordered this visit:  No orders of the defined types were placed in this encounter.      Return in about 2 years (around 12/08/2023) for DILATE OU, COLOR FP, OCT.  There are no Patient Instructions on file for this visit.   Explained the diagnoses, plan, and follow up with the patient and they expressed understanding.  Patient expressed understanding of the importance of proper follow up care.   Clent Demark Charon Smedberg M.D. Diseases & Surgery of the Retina and Vitreous [facility] 12/07/21     Abbreviations: M myopia (nearsighted); A astigmatism; H hyperopia (farsighted); P presbyopia; Mrx spectacle prescription;  CTL contact lenses; OD right eye; OS left eye; OU both eyes  XT exotropia; ET esotropia; PEK punctate epithelial keratitis; PEE punctate epithelial erosions; DES dry eye syndrome; MGD meibomian gland dysfunction; ATs artificial tears; PFAT's preservative free  artificial tears; [place] nuclear sclerotic cataract; PSC posterior subcapsular cataract; ERM epi-retinal membrane; PVD posterior vitreous detachment; RD retinal detachment; DM diabetes mellitus; DR diabetic retinopathy; NPDR non-proliferative diabetic retinopathy; PDR proliferative diabetic retinopathy; CSME clinically significant macular edema; DME diabetic macular edema; dbh dot blot hemorrhages; CWS cotton wool spot; POAG primary open angle glaucoma; C/D cup-to-disc ratio; HVF humphrey visual field; GVF goldmann visual field; OCT optical coherence tomography; IOP intraocular pressure; BRVO Branch retinal vein occlusion; CRVO central retinal vein occlusion; CRAO central retinal artery occlusion; BRAO branch retinal artery occlusion; RT retinal tear; SB scleral buckle; PPV pars plana vitrectomy; VH Vitreous hemorrhage; PRP panretinal laser photocoagulation; IVK intravitreal kenalog; VMT vitreomacular traction; MH Macular hole;  NVD neovascularization of the disc; NVE neovascularization elsewhere; AREDS age related eye disease study; ARMD age related macular degeneration; POAG primary open angle glaucoma; EBMD epithelial/anterior basement membrane dystrophy; ACIOL anterior chamber intraocular lens; IOL intraocular lens; PCIOL posterior chamber intraocular lens; Phaco/IOL phacoemulsification with intraocular lens placement; [place] photorefractive keratectomy; LASIK laser assisted in situ keratomileusis; HTN hypertension; DM diabetes mellitus; COPD chronic obstructive pulmonary disease

## 2022-03-14 ENCOUNTER — Ambulatory Visit: Payer: Medicare Other | Admitting: Emergency Medicine

## 2022-06-06 DIAGNOSIS — H40053 Ocular hypertension, bilateral: Secondary | ICD-10-CM | POA: Diagnosis not present

## 2022-06-06 DIAGNOSIS — H40013 Open angle with borderline findings, low risk, bilateral: Secondary | ICD-10-CM | POA: Diagnosis not present

## 2022-09-24 ENCOUNTER — Telehealth: Payer: Self-pay | Admitting: Emergency Medicine

## 2022-09-24 NOTE — Telephone Encounter (Signed)
LVM for pt to rtn my call to schedule AWV-I with [facility] call back # [phone] 

## 2022-11-01 ENCOUNTER — Other Ambulatory Visit: Payer: Self-pay | Admitting: Emergency Medicine

## 2022-11-01 DIAGNOSIS — I10 Essential (primary) hypertension: Secondary | ICD-10-CM

## 2022-12-07 ENCOUNTER — Other Ambulatory Visit: Payer: Self-pay | Admitting: Emergency Medicine

## 2022-12-07 DIAGNOSIS — I10 Essential (primary) hypertension: Secondary | ICD-10-CM

## 2022-12-13 DIAGNOSIS — R7303 Prediabetes: Secondary | ICD-10-CM | POA: Diagnosis not present

## 2022-12-13 DIAGNOSIS — I10 Essential (primary) hypertension: Secondary | ICD-10-CM | POA: Diagnosis not present

## 2022-12-13 DIAGNOSIS — E7800 Pure hypercholesterolemia, unspecified: Secondary | ICD-10-CM | POA: Diagnosis not present

## 2022-12-17 DIAGNOSIS — D2262 Melanocytic nevi of left upper limb, including shoulder: Secondary | ICD-10-CM | POA: Diagnosis not present

## 2022-12-17 DIAGNOSIS — D225 Melanocytic nevi of trunk: Secondary | ICD-10-CM | POA: Diagnosis not present

## 2022-12-17 DIAGNOSIS — L814 Other melanin hyperpigmentation: Secondary | ICD-10-CM | POA: Diagnosis not present

## 2022-12-17 DIAGNOSIS — L821 Other seborrheic keratosis: Secondary | ICD-10-CM | POA: Diagnosis not present

## 2022-12-17 DIAGNOSIS — D2261 Melanocytic nevi of right upper limb, including shoulder: Secondary | ICD-10-CM | POA: Diagnosis not present

## 2022-12-17 DIAGNOSIS — L570 Actinic keratosis: Secondary | ICD-10-CM | POA: Diagnosis not present

## 2022-12-18 DIAGNOSIS — H40023 Open angle with borderline findings, high risk, bilateral: Secondary | ICD-10-CM | POA: Diagnosis not present

## 2022-12-18 DIAGNOSIS — H40053 Ocular hypertension, bilateral: Secondary | ICD-10-CM | POA: Diagnosis not present

## 2022-12-18 DIAGNOSIS — H35371 Puckering of macula, right eye: Secondary | ICD-10-CM | POA: Diagnosis not present

## 2022-12-18 DIAGNOSIS — H2512 Age-related nuclear cataract, left eye: Secondary | ICD-10-CM | POA: Diagnosis not present

## 2022-12-18 DIAGNOSIS — H5213 Myopia, bilateral: Secondary | ICD-10-CM | POA: Diagnosis not present

## 2023-01-10 ENCOUNTER — Encounter (INDEPENDENT_AMBULATORY_CARE_PROVIDER_SITE_OTHER): Payer: Medicare Other | Admitting: Ophthalmology

## 2023-03-19 ENCOUNTER — Other Ambulatory Visit: Payer: Self-pay

## 2023-03-19 ENCOUNTER — Emergency Department (HOSPITAL_BASED_OUTPATIENT_CLINIC_OR_DEPARTMENT_OTHER)
Admission: EM | Admit: 2023-03-19 | Discharge: 2023-03-19 | Disposition: A | Discharge status: Home / Self Care | Payer: Medicare Other | Attending: Emergency Medicine | Admitting: Emergency Medicine

## 2023-03-19 ENCOUNTER — Encounter (HOSPITAL_BASED_OUTPATIENT_CLINIC_OR_DEPARTMENT_OTHER): Payer: Self-pay | Admitting: Emergency Medicine

## 2023-03-19 DIAGNOSIS — Z7982 Long term (current) use of aspirin: Secondary | ICD-10-CM | POA: Diagnosis not present

## 2023-03-19 DIAGNOSIS — R002 Palpitations: Secondary | ICD-10-CM

## 2023-03-19 DIAGNOSIS — I10 Essential (primary) hypertension: Secondary | ICD-10-CM | POA: Diagnosis not present

## 2023-03-19 DIAGNOSIS — Z79899 Other long term (current) drug therapy: Secondary | ICD-10-CM | POA: Insufficient documentation

## 2023-03-19 DIAGNOSIS — R7303 Prediabetes: Secondary | ICD-10-CM | POA: Diagnosis not present

## 2023-03-19 LAB — COMPREHENSIVE METABOLIC PANEL
ALT: 16 U/L (ref 0–44)
AST: 16 U/L (ref 15–41)
Albumin: 4 g/dL (ref 3.5–5.0)
Alkaline Phosphatase: 53 U/L (ref 38–126)
Anion gap: 9 (ref 5–15)
BUN: 27 mg/dL — ABNORMAL HIGH (ref 8–23)
CO2: 24 mmol/L (ref 22–32)
Calcium: 9 mg/dL (ref 8.9–10.3)
Chloride: 105 mmol/L (ref 98–111)
Creatinine, Ser: 0.96 mg/dL (ref 0.61–1.24)
GFR, Estimated: >60 mL/min (ref >60)
Glucose, Bld: 101 mg/dL — ABNORMAL HIGH (ref 70–99)
Potassium: 3.9 mmol/L (ref 3.5–5.1)
Sodium: 138 mmol/L (ref 135–145)
Total Bilirubin: 0.5 mg/dL (ref 0.3–1.2)
Total Protein: 7 g/dL (ref 6.5–8.1)

## 2023-03-19 LAB — CBC WITH DIFFERENTIAL/PLATELET
Abs Immature Granulocytes: 0.01 10*3/uL (ref 0.00–0.07)
Basophils Absolute: 0 10*3/uL (ref 0.0–0.1)
Basophils Relative: 1 %
Eosinophils Absolute: 0.1 10*3/uL (ref 0.0–0.5)
Eosinophils Relative: 2 %
HCT: 46.5 % (ref 39.0–52.0)
Hemoglobin: 16 g/dL (ref 13.0–17.0)
Immature Granulocytes: 0 %
Lymphocytes Relative: 20 %
Lymphs Abs: 1.1 10*3/uL (ref 0.7–4.0)
MCH: 32.3 pg (ref 26.0–34.0)
MCHC: 34.4 g/dL (ref 30.0–36.0)
MCV: 93.9 fL (ref 80.0–100.0)
Monocytes Absolute: 0.5 10*3/uL (ref 0.1–1.0)
Monocytes Relative: 8 %
Neutro Abs: 3.9 10*3/uL (ref 1.7–7.7)
Neutrophils Relative %: 69 %
Platelets: 211 10*3/uL (ref 150–400)
RBC: 4.95 MIL/uL (ref 4.22–5.81)
RDW: 12.7 % (ref 11.5–15.5)
WBC: 5.6 10*3/uL (ref 4.0–10.5)
nRBC: 0 % (ref 0.0–0.2)

## 2023-03-19 LAB — MAGNESIUM: Magnesium: 2.2 mg/dL (ref 1.7–2.4)

## 2023-03-19 LAB — TROPONIN I (HIGH SENSITIVITY): Troponin I (High Sensitivity): 6 ng/L (ref <18)

## 2023-03-19 NOTE — ED Notes (Signed)

## 2023-03-19 NOTE — ED Provider Notes (Signed)
[facility] Provider Note   CSN: [id number] Arrival date & time: 03/19/23  1627     History {Add pertinent medical, surgical, social history, OB history to HPI:1} Chief Complaint  Patient presents with   Palpitations    Matthew [name] is a 67 y.o. male.  HPI     67 year old male with history of hypertension, elevated LDL, prediabetes, obesity presents with concern for palpitations.  CoQ10, took 2 chewable Mg tablets today  Started having palpitations, off and on for 3 weeks, feels irregular at times. In the past had heart raciing but not this time. Feels like skipping a beat. Checked pulse and felt a skipped beat every so often.  No triggers.  Decided not to have caffeine, not take weight loss supplement had.  Started the weight loss supplement about 3 weeks ago.  Did forget bp pill last night.   No other stopped medications.  No smoking. Occ etoh. Saw cardiologist thinks in 2015 thinks for pain.  Has had some of this before but not to this extreme.   Home Medications Prior to Admission medications   Medication Sig Start Date End Date Taking? Authorizing Provider  ALPRAZolam Prudy Feeler) 1 MG tablet Take 1 tablet (1 mg total) by mouth at bedtime as needed for sleep. 06/03/20   Tarry Kos, MD  aspirin EC 325 MG EC tablet Take 1 tablet (325 mg total) by mouth 2 (two) times daily after a meal. 09/10/15   Kathryne Hitch, MD  Coenzyme Q10 (COQ10 PO) Take 300 mg by mouth daily.    [provider]  diazepam (VALIUM) 5 MG tablet TAKE ONE TAB ONE HOUR PRIOR TO MRI REPEAT AS NEEDED #2 . ZERO REFILLS 03/16/19   Kirtland Bouchard, PA-C  Glucosamine-Chondroitin-MSM (GLUCOSAMINE CHONDROIT MSM DS) TABS Take 1,500 mg by mouth daily.    [provider]  ketorolac (ACULAR) 0.5 % ophthalmic solution Place 1 drop into the right eye 4 (four) times daily.  02/01/20   [provider]  latanoprost (XALATAN) 0.005 % ophthalmic  solution Place 1 drop into both eyes at bedtime.    [provider]  lisinopril (ZESTRIL) 20 MG tablet Take 1 tablet (20 mg total) by mouth daily. Overdue for annual appt must see MD for refills 11/01/22   Sagardia, Eilleen Kempf, MD  OVER THE COUNTER MEDICATION     [provider]  oxyCODONE (ROXICODONE) 5 MG immediate release tablet Take 1 tablet (5 mg total) by mouth every 6 (six) hours as needed for severe pain. 12/28/20   Kathryne Hitch, MD  rosuvastatin (CRESTOR) 10 MG tablet Take 1 tablet (10 mg total) by mouth daily. 09/12/21   Georgina Quint, MD  VITAMIN D PO Take by mouth daily.    [provider]      Allergies    Patient has no known allergies.    Review of Systems   Review of Systems  Physical Exam Updated Vital Signs BP (!) 157/102 (BP Location: Left Arm)   Pulse 85   Temp 97.9 F (36.6 C)   Resp 20   Ht 5\' 7"  (1.702 m)   Wt 106.1 kg   SpO2 95%   BMI 36.65 kg/m  Physical Exam  ED Results / Procedures / Treatments   Labs (all labs ordered are listed, but only abnormal results are displayed) Labs Reviewed - No data to display  EKG EKG Interpretation  Date/Time:  Tuesday Mar 19 2023  16:43:00 EDT Ventricular Rate:  85 PR Interval:  175 QRS Duration: 95 QT Interval:  374 QTC Calculation: 445 R Axis:   19 Text Interpretation: Sinus rhythm No significant change since last tracing Confirmed by Alvira Monday ([id number]) on 03/19/2023 4:45:00 PM  Radiology No results found.  Procedures Procedures  {Document cardiac monitor, telemetry assessment procedure when appropriate:1}  Medications Ordered in ED Medications - No data to display  ED Course/ Medical Decision Making/ A&P   {   Click here for ABCD2, HEART and other calculatorsREFRESH Note before signing :1}                           67 year old male with history of hypertension, elevated LDL, prediabetes, obesity presents with concern for palpitations.  {Document  critical care time when appropriate:1} {Document review of labs and clinical decision tools ie heart score, Chads2Vasc2 etc:1}  {Document your independent review of radiology images, and any outside records:1} {Document your discussion with family members, caretakers, and with consultants:1} {Document social determinants of health affecting pt's care:1} {Document your decision making why or why not admission, treatments were needed:1} Final Clinical Impression(s) / ED Diagnoses Final diagnoses:  None    Rx / DC Orders ED Discharge Orders     None

## 2023-03-19 NOTE — ED Triage Notes (Signed)
Patient arrives ambulatory by POV c/o feeling like irregular heart beat x 3 weeks. Patient states he will feel his pulse and it randomly skips a beat. Reports cutting out caffeine with no help.   Denies any chest pain or shortness of breath.

## 2023-03-20 ENCOUNTER — Encounter: Payer: Self-pay | Admitting: Physician Assistant

## 2023-03-20 ENCOUNTER — Ambulatory Visit: Payer: Medicare Other | Admitting: Physician Assistant

## 2023-03-20 ENCOUNTER — Other Ambulatory Visit: Payer: Self-pay

## 2023-03-20 DIAGNOSIS — M25552 Pain in left hip: Secondary | ICD-10-CM | POA: Diagnosis not present

## 2023-03-20 MED ORDER — MELOXICAM 7.5 MG PO TABS
7.5000 mg | ORAL_TABLET | Freq: Every day | ORAL | 0 refills | Status: DC
Start: 2023-03-20 — End: 2024-07-21

## 2023-03-20 NOTE — Progress Notes (Signed)
Office Visit Note   Patient: Matthew [name]           Date of Birth: 03-19-1956           MRN: [id number] Visit Date: 03/20/2023              Requested by: Georgina Quint, MD [address] [place],  [place] [address] PCP: Georgina Quint, MD  Chief Complaint  Patient presents with   Left Hip - Pain      HPI: Matthew [name] is a pleasant 67 year old gentleman who is normally a patient of Dr. Eliberto Ivory.  He has had bilateral knee replacements and hip replacement done more recently by Dr. Magnus Ivan.  He also has a history of spinal fusion.  He comes in today with a 1 week history of left posterior hip pain.  He does have an his history of arthritis in this left hip as well.  However his pain occurred when he was swinging a golf club.  About a week ago.  He said he ended up getting sore the next day he did use a percussion device does not know if this caused it but he ended up with a significant amount of bruising over his left upper pelvis.  This is now resolving and he admits he is getting a little bit better.  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  Has been taking ibuprofen occasionally.  Describes his pain as mild to moderate  Assessment & Plan: Visit Diagnoses:  1. Pain in left hip     Plan: His exam of this hip does not show any increased pain in the groin no problem with logrolling he does have resolving bruising over the posterior upper pelvis.  No significant tenderness to palpation.  Is has good strength in his lower extremities no paresthesias.  I asked if he be willing to try a short course of meloxicam to help with inflammation he like to do this he knows not to take ibuprofen with it and to take it with food  Follow-Up Instructions: No follow-ups on file.   Ortho Exam  Patient is alert, oriented, no adenopathy, well-dressed, normal affect, normal respiratory effort. Examination he appears well.  He  is neurovascular intact compartments are soft and nontender in his lower extremities.  No pain with manipulation of his hip.  He does have some resolving bruising on the posterior pelvis.  Does not really tender with deep palpation.  He has good abduction and abduction strength without any pain  Imaging: No results found. No images are attached to the encounter.  Labs: Lab Results  Component Value Date   HGBA1C 5.7 (H) 08/18/2020   REPTSTATUS 09/28/2011 FINAL 09/27/2011   CULT NO GROWTH 09/27/2011     Lab Results  Component Value Date   ALBUMIN 4.0 03/19/2023   ALBUMIN 5.0 (H) 08/18/2020   ALBUMIN 4.6 09/27/2011    Lab Results  Component Value Date   MG 2.2 03/19/2023   No results found for: "VD25OH"  No results found for: "PREALBUMIN"    Latest Ref Rng & Units 03/19/2023    5:10 PM 08/18/2020   10:32 AM 06/15/2019   10:43 AM  CBC EXTENDED  WBC 4.0 - 10.5 K/uL 5.6  4.0  3.3   RBC 4.22 - 5.81 MIL/uL 4.95  5.55  4.93   Hemoglobin 13.0 - 17.0 g/dL 62.1  30.8  65.7   HCT  39.0 - 52.0 % 46.5  51.9  48.0   Platelets 150 - 400 K/uL 211  228  188   NEUT# 1.7 - 7.7 K/uL 3.9  2.6    Lymph# 0.7 - 4.0 K/uL 1.1  1.0       There is no height or weight on file to calculate BMI.  Orders:  Orders Placed This Encounter  Procedures   XR HIP UNILAT W OR W/O PELVIS 2-3 VIEWS LEFT   Meds ordered this encounter  Medications   meloxicam (MOBIC) 7.5 MG tablet    Sig: Take 1 tablet (7.5 mg total) by mouth daily.    Dispense:  30 tablet    Refill:  0     Procedures: No procedures performed  Clinical Data: No additional findings.  ROS:  All other systems negative, except as noted in the HPI. Review of Systems  Objective: Vital Signs: There were no vitals taken for this visit.  Specialty Comments:  No specialty comments available.  PMFS History: Patient Active Problem List   Diagnosis Date Noted   Primary open angle glaucoma of both eyes, mild stage 11/23/2020   Right  epiretinal membrane 03/14/2020   Cystoid macular edema of right eye 03/14/2020   Nuclear sclerotic cataract of left eye 03/14/2020   Pseudophakia 03/14/2020   Snoring 03/14/2020   Essential hypertension 12/24/2019   Lumbar stenosis with neurogenic claudication 06/18/2019   Lumbar radiculopathy 10/30/2017   Osteoarthritis of right hip 09/09/2015   Status post total replacement of right hip 09/09/2015   Past Medical History:  Diagnosis Date   Anxiety    Arthritis    KNEES BACK SHOULDERS   Cancer ([facility])    HX SKIN CANCER   Cataracts, bilateral    Heart murmur    "nothing to be concerned with, I ve had it all my life."   Hx of nonmelanoma skin cancer    Hypertension    FOLLOWED BY DR ED GREEN    Family History  Problem Relation Age of Onset   Heart attack Brother        47   Hypertension Brother    Stroke Brother    Heart attack Paternal Grandfather        5 bypass surgeries   Prostate cancer Paternal Grandfather    Cancer Father        died of bladder cancer, prostate   Cancer Maternal Grandmother    Hypertension Brother     Past Surgical History:  Procedure Laterality Date   ANTERIOR LAT LUMBAR FUSION Left 06/18/2019   Procedure: Left Lumbar three-four Anterolateral lumbar decompression/interbody fusion with lateral plate fixation;  Surgeon: Barnett Abu, MD;  Location: [facility];  Service: Neurosurgery;  Laterality: Left;   BACK SURGERY  2010   L5S1 FUSION   COLONOSCOPY  05/13/2019   EYE SURGERY  10/2019   per patient retina   JOINT REPLACEMENT N/A    [employer]reesia 08/16/2020   KNEE ARTHROPLASTY  2010   L   KNEE ARTHROSCOPY     L X 2   REPLACEMENT TOTAL KNEE Left 11/18/2008   SHOULDER ARTHROSCOPY Left 05/14/2019   SPINE SURGERY N/A    [employer]reesia 08/16/2020   TOTAL HIP ARTHROPLASTY Right 09/09/2015   Procedure: RIGHT TOTAL HIP ARTHROPLASTY ANTERIOR APPROACH;  Surgeon: Kathryne Hitch, MD;  Location: [facility];  Service: Orthopedics;  Laterality: Right;   TOTAL KNEE  ARTHROPLASTY  10/08/2011   Procedure: rightTOTAL KNEE ARTHROPLASTY;  Surgeon: Raymon Mutton, MD;  Location: [facility];  Service: Orthopedics;  Laterality: Right;  TOTAL RIGHT KNEE ARTHROPLASTY    Social History   Occupational History   Not on file  Tobacco Use   Smoking status: Never   Smokeless tobacco: Never  Vaping Use   Vaping Use: Never used  Substance and Sexual Activity   Alcohol use: Yes    Alcohol/week: 2.0 standard drinks of alcohol    Types: 2 Cans of beer per week    Comment: OCCASIONAL-wine, beer,mixed   Drug use: No   Sexual activity: Not on file

## 2023-04-22 ENCOUNTER — Ambulatory Visit: Payer: Medicare Other | Admitting: Urology

## 2023-04-22 ENCOUNTER — Encounter: Payer: Self-pay | Admitting: Urology

## 2023-04-22 VITALS — BP 172/98 | HR 78 | Ht 67.0 in | Wt 228.0 lb

## 2023-04-22 DIAGNOSIS — N401 Enlarged prostate with lower urinary tract symptoms: Secondary | ICD-10-CM | POA: Diagnosis not present

## 2023-04-22 DIAGNOSIS — R972 Elevated prostate specific antigen [PSA]: Secondary | ICD-10-CM | POA: Diagnosis not present

## 2023-04-22 DIAGNOSIS — N529 Male erectile dysfunction, unspecified: Secondary | ICD-10-CM

## 2023-04-22 DIAGNOSIS — Z8042 Family history of malignant neoplasm of prostate: Secondary | ICD-10-CM | POA: Insufficient documentation

## 2023-04-22 DIAGNOSIS — N138 Other obstructive and reflux uropathy: Secondary | ICD-10-CM

## 2023-04-22 MED ORDER — TADALAFIL 5 MG PO TABS
5.0000 mg | ORAL_TABLET | Freq: Every day | ORAL | 11 refills | Status: DC
Start: 2023-04-22 — End: 2024-09-01

## 2023-04-22 MED ORDER — TADALAFIL 5 MG PO TABS
5.0000 mg | ORAL_TABLET | Freq: Every day | ORAL | 11 refills | Status: DC
Start: 2023-04-22 — End: 2023-04-22

## 2023-04-22 NOTE — Progress Notes (Signed)
Assessment: 1. BPH with obstruction/lower urinary tract symptoms   2. Family history of prostate cancer   3. Elevated PSA   4. Organic impotence     Plan: I personally reviewed the patient's chart including provider notes, and lab results. Free and total PSA today.  Will call with results. Continue daily tadalafil for BPH and ED- rx sent Today I had a  discussion with the patient discussing ED.  I discussed the pathophysiology, etiology, and natural history of ED as well as management options using a goal-oriented approach.  We discussed the following options: Medical therapy, vacuum erection device, penile injections, intraurethral suppository therapy (MUSE), and penile prosthesis.  Chief Complaint:  Chief Complaint  Patient presents with   Elevated PSA    History of Present Illness:  Matthew [name] is a 67 y.o. male who is seen for evaluation of elevated PSA, family history of prostate cancer, lower urinary tract symptoms, and erectile dysfunction. He has a family history of prostate cancer with his father.  No prior prostate biopsy. PSA results: 10/21 6.5 10/21 3.6 3/22 3.3 11/23 2.5 2/24 4.88  He reports lower urinary tract symptoms including decreased stream and hesitancy.  No dysuria or gross hematuria.  He has noted some improvement in his urinary symptoms with the addition of tadalafil 5 mg daily. IPSS = 17 today.  He reports difficulty achieving and maintaining erections.  He does have occasional nocturnal erections.  He is not currently sexually active.  He feels like the daily tadalafil has improved his erectile function.  No pain or curvature.  No decreased libido.   Past Medical History:  Past Medical History:  Diagnosis Date   Anxiety    Arthritis    KNEES BACK SHOULDERS   Cancer (HCC)    HX SKIN CANCER   Cataracts, bilateral    Heart murmur    "nothing to be concerned with, I ve had it all my life."   Hx of nonmelanoma skin cancer    Hypertension     FOLLOWED BY DR ED GREEN    Past Surgical History:  Past Surgical History:  Procedure Laterality Date   ANTERIOR LAT LUMBAR FUSION Left 06/18/2019   Procedure: Left Lumbar three-four Anterolateral lumbar decompression/interbody fusion with lateral plate fixation;  Surgeon: Barnett Abu, MD;  Location: [facility];  Service: Neurosurgery;  Laterality: Left;   BACK SURGERY  2010   L5S1 FUSION   COLONOSCOPY  05/13/2019   EYE SURGERY  10/2019   per patient retina   JOINT REPLACEMENT N/A    Phreesia 08/16/2020   KNEE ARTHROPLASTY  2010   L   KNEE ARTHROSCOPY     L X 2   REPLACEMENT TOTAL KNEE Left 11/18/2008   SHOULDER ARTHROSCOPY Left 05/14/2019   SPINE SURGERY N/A    Phreesia 08/16/2020   TOTAL HIP ARTHROPLASTY Right 09/09/2015   Procedure: RIGHT TOTAL HIP ARTHROPLASTY ANTERIOR APPROACH;  Surgeon: Kathryne Hitch, MD;  Location: [facility];  Service: Orthopedics;  Laterality: Right;   TOTAL KNEE ARTHROPLASTY  10/08/2011   Procedure: rightTOTAL KNEE ARTHROPLASTY;  Surgeon: Raymon Mutton, MD;  Location: [facility];  Service: Orthopedics;  Laterality: Right;  TOTAL RIGHT KNEE ARTHROPLASTY     Allergies:  No Known Allergies  Family History:  Family History  Problem Relation Age of Onset   Heart attack Brother        42   Hypertension Brother    Stroke Brother    Heart attack Paternal Grandfather  5 bypass surgeries   Prostate cancer Paternal Grandfather    Cancer Father        died of bladder cancer, prostate   Cancer Maternal Grandmother    Hypertension Brother     Social History:  Social History   Tobacco Use   Smoking status: Never   Smokeless tobacco: Never  Vaping Use   Vaping Use: Never used  Substance Use Topics   Alcohol use: Yes    Alcohol/week: 2.0 standard drinks of alcohol    Types: 2 Cans of beer per week    Comment: OCCASIONAL-wine, beer,mixed   Drug use: No    Review of symptoms:  Constitutional:  Negative for unexplained weight loss, night  sweats, fever, chills ENT:  Negative for nose bleeds, sinus pain, painful swallowing CV:  Negative for chest pain, shortness of breath, exercise intolerance, palpitations, loss of consciousness Resp:  Negative for cough, wheezing, shortness of breath GI:  Negative for nausea, vomiting, diarrhea, bloody stools GU:  Positives noted in HPI; otherwise negative for gross hematuria, dysuria, urinary incontinence Neuro:  Negative for seizures, poor balance, limb weakness, slurred speech Psych:  Negative for lack of energy, depression, anxiety Endocrine:  Negative for polydipsia, polyuria, symptoms of hypoglycemia (dizziness, hunger, sweating) Hematologic:  Negative for anemia, purpura, petechia, prolonged or excessive bleeding, use of anticoagulants  Allergic:  Negative for difficulty breathing or choking as a result of exposure to anything; no shellfish allergy; no allergic response (rash/itch) to materials, foods  Physical exam: BP (!) 172/98   Pulse 78   Ht 5\' 7"  (1.702 m)   Wt 228 lb (103.4 kg)   BMI 35.71 kg/m  GENERAL APPEARANCE:  Well appearing, well developed, well nourished, NAD HEENT: Atraumatic, Normocephalic, oropharynx clear. NECK: Supple without lymphadenopathy or thyromegaly. LUNGS: Clear to auscultation bilaterally. HEART: Regular Rate and Rhythm without murmurs, gallops, or rubs. ABDOMEN: Soft, non-tender, No Masses. EXTREMITIES: Moves all extremities well.  Without clubbing, cyanosis, or edema. NEUROLOGIC:  Alert and oriented x 3, normal gait, CN II-XII grossly intact.  MENTAL STATUS:  Appropriate. BACK:  Non-tender to palpation.  No CVAT SKIN:  Warm, dry and intact.  GU: Penis:  uncircumcised Meatus: Normal Scrotum: normal, no masses Testis: normal without masses bilateral Epididymis: normal Prostate: 40 g, NT, no nodules Rectum: Normal tone,  no masses or tenderness    Results: U/A:  0-5 WBC, 0-2 RBC, few bacteria

## 2023-04-23 ENCOUNTER — Encounter: Payer: Self-pay | Admitting: Urology

## 2023-04-23 LAB — PSA, TOTAL AND FREE
PSA, Free Pct: 18.5 %
PSA, Free: 0.89 ng/mL
Prostate Specific Ag, Serum: 4.8 ng/mL — ABNORMAL HIGH (ref 0.0–4.0)

## 2023-05-01 ENCOUNTER — Ambulatory Visit: Payer: Medicare Other | Admitting: Urology

## 2023-05-01 ENCOUNTER — Other Ambulatory Visit: Payer: Medicare Other

## 2023-05-01 ENCOUNTER — Encounter: Payer: Self-pay | Admitting: Urology

## 2023-05-01 VITALS — BP 166/93 | HR 75 | Ht 67.0 in | Wt 230.0 lb

## 2023-05-01 DIAGNOSIS — R972 Elevated prostate specific antigen [PSA]: Secondary | ICD-10-CM | POA: Diagnosis not present

## 2023-05-01 DIAGNOSIS — Z8042 Family history of malignant neoplasm of prostate: Secondary | ICD-10-CM

## 2023-05-01 NOTE — Progress Notes (Signed)
Assessment: 1. Elevated PSA   2. Family history of prostate cancer     Plan: MyProstateScore 2.0 sent today - will call with results The purpose of the test to identify patients at increased risk for prostate cancer on biopsy was discussed.  He understands that this test will not make a diagnosis of prostate cancer and that further evaluation would still be necessary if he is found to be at high risk. Continue daily tadalafil for BPH and ED  Chief Complaint:  Chief Complaint  Patient presents with   Elevated PSA    History of Present Illness:  Matthew [name] is a 67 y.o. male who is seen for further evaluation of elevated PSA with MyProstateScore2.0.  He has a family history of prostate cancer, lower urinary tract symptoms, and erectile dysfunction. He has a family history of prostate cancer with his father.  No prior prostate biopsy. PSA results: 10/21 6.5 10/21 3.6 3/22 3.3 11/23 2.5 2/24 4.88 6/24 4.8 with 18.5% free  He reports lower urinary tract symptoms including decreased stream and hesitancy.  No dysuria or gross hematuria.  He has noted some improvement in his urinary symptoms with the addition of tadalafil 5 mg daily. IPSS = 17.  He reports difficulty achieving and maintaining erections.  He does have occasional nocturnal erections.  He is not currently sexually active.  He feels like the daily tadalafil has improved his erectile function.  No pain or curvature.  No decreased libido.  Portions of the above documentation were copied from a prior visit for review purposes only.  Past Medical History:  Past Medical History:  Diagnosis Date   Anxiety    Arthritis    KNEES BACK SHOULDERS   Cancer (HCC)    HX SKIN CANCER   Cataracts, bilateral    Heart murmur    "nothing to be concerned with, I ve had it all my life."   Hx of nonmelanoma skin cancer    Hypertension    FOLLOWED BY DR ED GREEN    Past Surgical History:  Past Surgical History:   Procedure Laterality Date   ANTERIOR LAT LUMBAR FUSION Left 06/18/2019   Procedure: Left Lumbar three-four Anterolateral lumbar decompression/interbody fusion with lateral plate fixation;  Surgeon: Barnett Abu, MD;  Location: [facility];  Service: Neurosurgery;  Laterality: Left;   BACK SURGERY  2010   L5S1 FUSION   COLONOSCOPY  05/13/2019   EYE SURGERY  10/2019   per patient retina   JOINT REPLACEMENT N/A    Phreesia 08/16/2020   KNEE ARTHROPLASTY  2010   L   KNEE ARTHROSCOPY     L X 2   REPLACEMENT TOTAL KNEE Left 11/18/2008   SHOULDER ARTHROSCOPY Left 05/14/2019   SPINE SURGERY N/A    Phreesia 08/16/2020   TOTAL HIP ARTHROPLASTY Right 09/09/2015   Procedure: RIGHT TOTAL HIP ARTHROPLASTY ANTERIOR APPROACH;  Surgeon: Kathryne Hitch, MD;  Location: [facility];  Service: Orthopedics;  Laterality: Right;   TOTAL KNEE ARTHROPLASTY  10/08/2011   Procedure: rightTOTAL KNEE ARTHROPLASTY;  Surgeon: Raymon Mutton, MD;  Location: [facility];  Service: Orthopedics;  Laterality: Right;  TOTAL RIGHT KNEE ARTHROPLASTY     Allergies:  No Known Allergies  Family History:  Family History  Problem Relation Age of Onset   Heart attack Brother        41   Hypertension Brother    Stroke Brother    Heart attack Paternal Grandfather  5 bypass surgeries   Prostate cancer Paternal Grandfather    Cancer Father        died of bladder cancer, prostate   Cancer Maternal Grandmother    Hypertension Brother     Social History:  Social History   Tobacco Use   Smoking status: Never   Smokeless tobacco: Never  Vaping Use   Vaping Use: Never used  Substance Use Topics   Alcohol use: Yes    Alcohol/week: 2.0 standard drinks of alcohol    Types: 2 Cans of beer per week    Comment: OCCASIONAL-wine, beer,mixed   Drug use: No    ROS: Constitutional:  Negative for fever, chills, weight loss CV: Negative for chest pain, previous MI, hypertension Respiratory:  Negative for shortness of  breath, wheezing, sleep apnea, frequent cough GI:  Negative for nausea, vomiting, bloody stool, GERD  Physical exam: BP (!) 166/93   Pulse 75   Ht 5\' 7"  (1.702 m)   Wt 230 lb (104.3 kg)   BMI 36.02 kg/m  GU:  Prostate exam performed prior to obtaining urine sample for MyProstateScore 2.0    Results: None

## 2023-05-06 ENCOUNTER — Other Ambulatory Visit: Payer: Self-pay | Admitting: Urology

## 2023-05-06 ENCOUNTER — Encounter: Payer: Self-pay | Admitting: Urology

## 2023-05-06 DIAGNOSIS — N529 Male erectile dysfunction, unspecified: Secondary | ICD-10-CM

## 2023-05-06 LAB — URINALYSIS, ROUTINE W REFLEX MICROSCOPIC
Bilirubin, UA: NEGATIVE
Glucose, UA: NEGATIVE
Ketones, UA: NEGATIVE
Leukocytes,UA: NEGATIVE
Nitrite, UA: NEGATIVE
Protein,UA: NEGATIVE
Specific Gravity, UA: 1.025 (ref 1.005–1.030)
Urobilinogen, Ur: 0.2 mg/dL (ref 0.2–1.0)
pH, UA: 6 (ref 5.0–7.5)

## 2023-05-06 LAB — MICROSCOPIC EXAMINATION: Epithelial Cells (non renal): NONE SEEN /hpf (ref 0–10)

## 2023-05-06 MED ORDER — VARDENAFIL HCL 20 MG PO TABS
20.0000 mg | ORAL_TABLET | Freq: Every day | ORAL | 11 refills | Status: AC | PRN
Start: 2023-05-06 — End: ?

## 2023-05-28 ENCOUNTER — Encounter: Payer: Self-pay | Admitting: Urology

## 2023-05-29 ENCOUNTER — Encounter: Payer: Self-pay | Admitting: Urology

## 2023-06-13 ENCOUNTER — Encounter: Payer: Self-pay | Admitting: Urology

## 2023-06-17 ENCOUNTER — Other Ambulatory Visit: Payer: Self-pay | Admitting: Urology

## 2023-06-17 DIAGNOSIS — R972 Elevated prostate specific antigen [PSA]: Secondary | ICD-10-CM

## 2023-06-18 ENCOUNTER — Ambulatory Visit: Payer: Medicare Other | Admitting: Cardiology

## 2023-06-19 DIAGNOSIS — H40023 Open angle with borderline findings, high risk, bilateral: Secondary | ICD-10-CM | POA: Diagnosis not present

## 2023-06-19 DIAGNOSIS — H40053 Ocular hypertension, bilateral: Secondary | ICD-10-CM | POA: Diagnosis not present

## 2023-06-25 DIAGNOSIS — Z129 Encounter for screening for malignant neoplasm, site unspecified: Secondary | ICD-10-CM | POA: Diagnosis not present

## 2023-06-25 DIAGNOSIS — D239 Other benign neoplasm of skin, unspecified: Secondary | ICD-10-CM | POA: Diagnosis not present

## 2023-06-25 DIAGNOSIS — L570 Actinic keratosis: Secondary | ICD-10-CM | POA: Diagnosis not present

## 2023-07-29 DIAGNOSIS — R002 Palpitations: Secondary | ICD-10-CM | POA: Diagnosis not present

## 2023-07-29 DIAGNOSIS — Z8249 Family history of ischemic heart disease and other diseases of the circulatory system: Secondary | ICD-10-CM | POA: Diagnosis not present

## 2023-07-29 DIAGNOSIS — R0609 Other forms of dyspnea: Secondary | ICD-10-CM | POA: Diagnosis not present

## 2023-07-29 DIAGNOSIS — I10 Essential (primary) hypertension: Secondary | ICD-10-CM | POA: Diagnosis not present

## 2023-07-29 DIAGNOSIS — R350 Frequency of micturition: Secondary | ICD-10-CM | POA: Diagnosis not present

## 2023-07-29 DIAGNOSIS — R7303 Prediabetes: Secondary | ICD-10-CM | POA: Diagnosis not present

## 2023-09-23 DIAGNOSIS — R002 Palpitations: Secondary | ICD-10-CM | POA: Diagnosis not present

## 2023-12-19 ENCOUNTER — Other Ambulatory Visit: Payer: Medicare Other

## 2023-12-26 ENCOUNTER — Ambulatory Visit: Payer: Medicare Other | Admitting: Urology

## 2024-01-27 DIAGNOSIS — F5101 Primary insomnia: Secondary | ICD-10-CM | POA: Diagnosis not present

## 2024-01-27 DIAGNOSIS — R350 Frequency of micturition: Secondary | ICD-10-CM | POA: Diagnosis not present

## 2024-01-27 DIAGNOSIS — R0609 Other forms of dyspnea: Secondary | ICD-10-CM | POA: Diagnosis not present

## 2024-01-27 DIAGNOSIS — R7989 Other specified abnormal findings of blood chemistry: Secondary | ICD-10-CM | POA: Diagnosis not present

## 2024-01-27 DIAGNOSIS — R002 Palpitations: Secondary | ICD-10-CM | POA: Diagnosis not present

## 2024-01-27 DIAGNOSIS — Z8249 Family history of ischemic heart disease and other diseases of the circulatory system: Secondary | ICD-10-CM | POA: Diagnosis not present

## 2024-01-27 DIAGNOSIS — Z0000 Encounter for general adult medical examination without abnormal findings: Secondary | ICD-10-CM | POA: Diagnosis not present

## 2024-01-27 DIAGNOSIS — I10 Essential (primary) hypertension: Secondary | ICD-10-CM | POA: Diagnosis not present

## 2024-01-27 DIAGNOSIS — R7303 Prediabetes: Secondary | ICD-10-CM | POA: Diagnosis not present

## 2024-02-05 DIAGNOSIS — H40053 Ocular hypertension, bilateral: Secondary | ICD-10-CM | POA: Diagnosis not present

## 2024-02-05 DIAGNOSIS — H2512 Age-related nuclear cataract, left eye: Secondary | ICD-10-CM | POA: Diagnosis not present

## 2024-02-05 DIAGNOSIS — H40023 Open angle with borderline findings, high risk, bilateral: Secondary | ICD-10-CM | POA: Diagnosis not present

## 2024-02-05 DIAGNOSIS — H35371 Puckering of macula, right eye: Secondary | ICD-10-CM | POA: Diagnosis not present

## 2024-02-05 DIAGNOSIS — H02403 Unspecified ptosis of bilateral eyelids: Secondary | ICD-10-CM | POA: Diagnosis not present

## 2024-02-05 DIAGNOSIS — H5213 Myopia, bilateral: Secondary | ICD-10-CM | POA: Diagnosis not present

## 2024-02-07 DIAGNOSIS — R0602 Shortness of breath: Secondary | ICD-10-CM | POA: Diagnosis not present

## 2024-02-07 DIAGNOSIS — R002 Palpitations: Secondary | ICD-10-CM | POA: Diagnosis not present

## 2024-02-18 DIAGNOSIS — R7303 Prediabetes: Secondary | ICD-10-CM | POA: Diagnosis not present

## 2024-02-18 DIAGNOSIS — I10 Essential (primary) hypertension: Secondary | ICD-10-CM | POA: Diagnosis not present

## 2024-02-21 DIAGNOSIS — R002 Palpitations: Secondary | ICD-10-CM | POA: Diagnosis not present

## 2024-03-03 DIAGNOSIS — R002 Palpitations: Secondary | ICD-10-CM | POA: Diagnosis not present

## 2024-03-03 DIAGNOSIS — I4719 Other supraventricular tachycardia: Secondary | ICD-10-CM | POA: Diagnosis not present

## 2024-06-15 ENCOUNTER — Telehealth: Payer: Self-pay | Admitting: Urology

## 2024-06-15 NOTE — Telephone Encounter (Signed)
 LVM for patient to call and schedule

## 2024-06-15 NOTE — Telephone Encounter (Signed)
-----   Message from Matthew [name] sent at 06/11/2024  2:34 PM EDT ----- Please contact patient to schedule a follow-up visit.  He has not been seen since last August.

## 2024-06-18 ENCOUNTER — Telehealth: Payer: Self-pay | Admitting: Urology

## 2024-06-18 NOTE — Telephone Encounter (Signed)
 Lvm for patient to call office and schedule.

## 2024-06-18 NOTE — Telephone Encounter (Signed)
-----   Message from Matthew [name] sent at 06/11/2024  2:34 PM EDT ----- Please contact patient to schedule a follow-up visit.  He has not been seen since last August.

## 2024-06-25 ENCOUNTER — Encounter: Payer: Self-pay | Admitting: Physician Assistant

## 2024-06-25 ENCOUNTER — Other Ambulatory Visit (INDEPENDENT_AMBULATORY_CARE_PROVIDER_SITE_OTHER)

## 2024-06-25 ENCOUNTER — Ambulatory Visit: Admitting: Physician Assistant

## 2024-06-25 ENCOUNTER — Telehealth: Payer: Self-pay | Admitting: Urology

## 2024-06-25 DIAGNOSIS — G8929 Other chronic pain: Secondary | ICD-10-CM

## 2024-06-25 DIAGNOSIS — M25552 Pain in left hip: Secondary | ICD-10-CM

## 2024-06-25 DIAGNOSIS — M5442 Lumbago with sciatica, left side: Secondary | ICD-10-CM

## 2024-06-25 MED ORDER — METHYLPREDNISOLONE 4 MG PO TABS: ORAL_TABLET | ORAL | 0 refills | Status: AC

## 2024-06-25 NOTE — Telephone Encounter (Signed)
 LVM for patient to call back and schedule

## 2024-06-25 NOTE — Progress Notes (Signed)
 Office Visit Note   Patient: Matthew [name]           Date of Birth: [birth year]           MRN: [id number] Visit Date: 06/25/2024              Requested by: Purcell Emil Schanz, MD [address] [place],  [place] [address] PCP: Purcell Emil Schanz, MD   Assessment & Plan: Visit Diagnoses:  1. Pain in left hip   2. Chronic left-sided low back pain with left-sided sciatica     Plan: Will send him to formal physical therapy for his back they will include back exercises, core strengthening, stretching, modalities and a home exercise program.  Placed him on a Medrol  Dosepak.  He is to take no NSAIDs while on the Medrol  Dosepak.  He has cyclobenzaprine which he can take at night for back spasm.  Follow-up with us  in 6 weeks  Follow-Up Instructions: Return in about 6 weeks (around 08/06/2024).   Orders:  Orders Placed This Encounter  Procedures   XR Lumbar Spine 2-3 Views   XR Pelvis 1-2 Views   Meds ordered this encounter  Medications   methylPREDNISolone  (MEDROL ) 4 MG tablet    Sig: Take as directed    Dispense:  21 tablet    Refill:  0    Supervising Provider:   VERNETTA LONNI GRADE [[id number]]      Procedures: No procedures performed   Clinical Data: No additional findings.   Subjective: Chief Complaint  Patient presents with   Lower Back - Pain   Left Hip - Pain    HPI Deion comes in today due to low back pain that radiates down his left leg to his knee.  He does have a history of back surgery but Dr. Gaither with L5-S1 fusion and then Dr. Colon L3-4 fusion.  He has had no injury to the back.  He states that his pain is worse with standing is mostly low back pain but does have some numbness down the left lateral leg to the knee.  He denies any changes in weight, bowel or bladder dysfunction, saddle anesthesia or waking pain.  He has tried some Flexeril which has been somewhat beneficial.  Otherwise she has tried Mobic  and ibuprofen.  Review of Systems See  HPI otherwise negative  Objective: Vital Signs: There were no vitals taken for this visit.  Physical Exam Constitutional:      Appearance: He is not ill-appearing or diaphoretic.  Cardiovascular:     Pulses: Normal pulses.  Neurological:     Mental Status: He is alert and oriented to person, place, and time.  Psychiatric:        Mood and Affect: Mood normal.     Ortho Exam Bilateral hips: Good range of motion both hips nontender over the trochanteric regions bilaterally. Lower extremities: 5 out of 5 strength throughout the lower extremities against resistance.  Negative straight leg raise bilaterally.  Sensation grossly intact bilateral feet to light touch. Specialty Comments:  No specialty comments available.  Imaging: XR Lumbar Spine 2-3 Views Result Date: 06/25/2024 Lumbar spine 2 views: No acute findings.  Status post L3-4 and L5-S1 fusion with no hardware failure.  Loss of disc space at L5-S1.  No spondylolisthesis.  XR Pelvis 1-2 Views Result Date: 06/25/2024 AP pelvis lateral view left hip: Bilateral hips well located.  Status post right total hip arthroplasty with well-seated components.  No acute fractures or acute findings.  Left hip hip joint slightly narrowed secondary to cam type morphology.    PMFS History: Patient Active Problem List   Diagnosis Date Noted   Family history of prostate cancer 04/22/2023   Elevated PSA 04/22/2023   BPH with obstruction/lower urinary tract symptoms 04/22/2023   Organic impotence 04/22/2023   Primary open angle glaucoma of both eyes, mild stage 11/23/2020   Right epiretinal membrane 03/14/2020   Cystoid macular edema of right eye 03/14/2020   Nuclear sclerotic cataract of left eye 03/14/2020   Pseudophakia 03/14/2020   Snoring 03/14/2020   Essential hypertension 12/24/2019   Lumbar stenosis with neurogenic claudication 06/18/2019   Lumbar radiculopathy 10/30/2017   Osteoarthritis of right hip 09/09/2015   Status post total  replacement of right hip 09/09/2015   Past Medical History:  Diagnosis Date   Anxiety    Arthritis    KNEES BACK SHOULDERS   Cancer (HCC)    HX SKIN CANCER   Cataracts, bilateral    Heart murmur    nothing to be concerned with, I ve had it all my life.   Hx of nonmelanoma skin cancer    Hypertension    FOLLOWED BY DR ED GREEN    Family History  Problem Relation Age of Onset   Heart attack Brother        9   Hypertension Brother    Stroke Brother    Heart attack Paternal Grandfather        5 bypass surgeries   Prostate cancer Paternal Grandfather    Cancer Father        died of bladder cancer, prostate   Cancer Maternal Grandmother    Hypertension Brother     Past Surgical History:  Procedure Laterality Date   ANTERIOR LAT LUMBAR FUSION Left 06/18/2019   Procedure: Left Lumbar three-four Anterolateral lumbar decompression/interbody fusion with lateral plate fixation;  Surgeon: Colon Shove, MD;  Location: [facility];  Service: Neurosurgery;  Laterality: Left;   BACK SURGERY  2010   L5S1 FUSION   COLONOSCOPY  05/13/2019   EYE SURGERY  10/2019   per patient retina   JOINT REPLACEMENT N/A    Phreesia 08/16/2020   KNEE ARTHROPLASTY  2010   L   KNEE ARTHROSCOPY     L X 2   REPLACEMENT TOTAL KNEE Left 11/18/2008   SHOULDER ARTHROSCOPY Left 05/14/2019   SPINE SURGERY N/A    Phreesia 08/16/2020   TOTAL HIP ARTHROPLASTY Right 09/09/2015   Procedure: RIGHT TOTAL HIP ARTHROPLASTY ANTERIOR APPROACH;  Surgeon: Lonni CINDERELLA Poli, MD;  Location: [facility];  Service: Orthopedics;  Laterality: Right;   TOTAL KNEE ARTHROPLASTY  10/08/2011   Procedure: rightTOTAL KNEE ARTHROPLASTY;  Surgeon: Garnette JONETTA Raman, MD;  Location: [facility];  Service: Orthopedics;  Laterality: Right;  TOTAL RIGHT KNEE ARTHROPLASTY    Social History   Occupational History   Not on file  Tobacco Use   Smoking status: Never   Smokeless tobacco: Never  Vaping Use   Vaping status: Never Used  Substance and  Sexual Activity   Alcohol use: Yes    Alcohol/week: 2.0 standard drinks of alcohol    Types: 2 Cans of beer per week    Comment: OCCASIONAL-wine, beer,mixed   Drug use: No   Sexual activity: Not on file

## 2024-06-26 NOTE — Addendum Note (Signed)
 Addended by: PETER FRIEZE B on: 06/26/2024 08:33 AM   Modules accepted: Orders

## 2024-07-21 ENCOUNTER — Other Ambulatory Visit: Payer: Self-pay | Admitting: Physician Assistant

## 2024-07-28 ENCOUNTER — Ambulatory Visit: Admitting: Urology

## 2024-08-12 ENCOUNTER — Ambulatory Visit: Admitting: Urology

## 2024-09-01 ENCOUNTER — Ambulatory Visit: Admitting: Urology

## 2024-09-01 ENCOUNTER — Encounter: Payer: Self-pay | Admitting: Urology

## 2024-09-01 VITALS — BP 184/105 | HR 73 | Ht 67.0 in | Wt 220.0 lb

## 2024-09-01 DIAGNOSIS — N138 Other obstructive and reflux uropathy: Secondary | ICD-10-CM | POA: Diagnosis not present

## 2024-09-01 DIAGNOSIS — N401 Enlarged prostate with lower urinary tract symptoms: Secondary | ICD-10-CM | POA: Diagnosis not present

## 2024-09-01 DIAGNOSIS — R972 Elevated prostate specific antigen [PSA]: Secondary | ICD-10-CM | POA: Diagnosis not present

## 2024-09-01 DIAGNOSIS — Z8042 Family history of malignant neoplasm of prostate: Secondary | ICD-10-CM

## 2024-09-01 DIAGNOSIS — N529 Male erectile dysfunction, unspecified: Secondary | ICD-10-CM | POA: Diagnosis not present

## 2024-09-01 LAB — URINALYSIS, ROUTINE W REFLEX MICROSCOPIC
Bilirubin, UA: NEGATIVE
Glucose, UA: NEGATIVE
Ketones, UA: NEGATIVE
Nitrite, UA: NEGATIVE
Protein,UA: NEGATIVE
Specific Gravity, UA: 1.015 (ref 1.005–1.030)
Urobilinogen, Ur: 0.2 mg/dL (ref 0.2–1.0)
pH, UA: 7 (ref 5.0–7.5)

## 2024-09-01 LAB — MICROSCOPIC EXAMINATION

## 2024-09-01 MED ORDER — ALFUZOSIN HCL ER 10 MG PO TB24
10.0000 mg | ORAL_TABLET | Freq: Every day | ORAL | 11 refills | Status: AC
Start: 2024-09-01 — End: ?

## 2024-09-01 MED ORDER — TADALAFIL 5 MG PO TABS
5.0000 mg | ORAL_TABLET | Freq: Every day | ORAL | 11 refills | Status: AC
Start: 2024-09-01 — End: ?

## 2024-09-01 NOTE — Progress Notes (Signed)
 Assessment: 1. Elevated PSA   2. Family history of prostate cancer   3. BPH with obstruction/lower urinary tract symptoms   4. Organic impotence     Plan: I had a lengthy discussion with the patient today regarding his elevated PSA, family history of prostate cancer, and abnormal urine biomarker results.  I again discussed further evaluation with prostate biopsy and prostate MRI.  He states that he is unable to have an MRI unless it is an open MRI.  I do not think that there are any available open MRIs with the 3T capability.  I further discussed evaluation with a prostate biopsy.  He would like to consider this option and will let me know. I discussed additional medical management of his lower urinary tract symptoms with an alpha-blocker.  Potential side effects discussed. Trial of alfuzosin 10 mg daily.  Prescription sent. Continue tadalafil  5 mg daily. Return to office in 2 months.  Chief Complaint:  Chief Complaint  Patient presents with   Elevated PSA    History of Present Illness:  Matthew [name] is a 68 y.o. male who is seen for further evaluation of elevated PSA.  He has a family history of prostate cancer, lower urinary tract symptoms, and erectile dysfunction. He has a family history of prostate cancer with his father.  No prior prostate biopsy. PSA results: 10/21 6.5 10/21 3.6 3/22 3.3 11/23 2.5 2/24 4.88 6/24 4.8 with 18.5% free 6/24 MPS2.0 showed 64.1% probability of aggressive prostate cancer on biopsy; further evaluation with prostate biopsy or MRI recommended. 3/25 4.50 9/25 5.05  He reports lower urinary tract symptoms including decreased stream and hesitancy.  No dysuria or gross hematuria.  He has noted some improvement in his urinary symptoms with the addition of tadalafil  5 mg daily. IPSS = 17.  He reported difficulty achieving and maintaining erections.  He does have occasional nocturnal erections.  He is not currently sexually active.  He feels  like the daily tadalafil  has improved his erectile function.  No pain or curvature.  No decreased libido.  He returns today for follow-up.  He has not been seen since his visit in June 2024.  He continues on tadalafil  5 mg daily.  He has continued lower urinary tract symptoms with a weak stream, intermittent stream, sensation of incomplete emptying.  No dysuria or gross hematuria. IPSS = 15/3.  Portions of the above documentation were copied from a prior visit for review purposes only.  Past Medical History:  Past Medical History:  Diagnosis Date   Anxiety    Arthritis    KNEES BACK SHOULDERS   Cancer (HCC)    HX SKIN CANCER   Cataracts, bilateral    Heart murmur    nothing to be concerned with, I ve had it all my life.   Hx of nonmelanoma skin cancer    Hypertension    FOLLOWED BY DR ED GREEN    Past Surgical History:  Past Surgical History:  Procedure Laterality Date   ANTERIOR LAT LUMBAR FUSION Left 06/18/2019   Procedure: Left Lumbar three-four Anterolateral lumbar decompression/interbody fusion with lateral plate fixation;  Surgeon: Colon Shove, MD;  Location: [facility];  Service: Neurosurgery;  Laterality: Left;   BACK SURGERY  2010   L5S1 FUSION   COLONOSCOPY  05/13/2019   EYE SURGERY  10/2019   per patient retina   JOINT REPLACEMENT N/A    Phreesia 08/16/2020   KNEE ARTHROPLASTY  2010   L   KNEE  ARTHROSCOPY     L X 2   REPLACEMENT TOTAL KNEE Left 11/18/2008   SHOULDER ARTHROSCOPY Left 05/14/2019   SPINE SURGERY N/A    Phreesia 08/16/2020   TOTAL HIP ARTHROPLASTY Right 09/09/2015   Procedure: RIGHT TOTAL HIP ARTHROPLASTY ANTERIOR APPROACH;  Surgeon: Lonni CINDERELLA Poli, MD;  Location: [facility];  Service: Orthopedics;  Laterality: Right;   TOTAL KNEE ARTHROPLASTY  10/08/2011   Procedure: rightTOTAL KNEE ARTHROPLASTY;  Surgeon: Garnette JONETTA Raman, MD;  Location: [facility];  Service: Orthopedics;  Laterality: Right;  TOTAL RIGHT KNEE ARTHROPLASTY     Allergies:  No Known  Allergies  Family History:  Family History  Problem Relation Age of Onset   Heart attack Brother        19   Hypertension Brother    Stroke Brother    Heart attack Paternal Grandfather        5 bypass surgeries   Prostate cancer Paternal Grandfather    Cancer Father        died of bladder cancer, prostate   Cancer Maternal Grandmother    Hypertension Brother     Social History:  Social History   Tobacco Use   Smoking status: Never   Smokeless tobacco: Never  Vaping Use   Vaping status: Never Used  Substance Use Topics   Alcohol use: Yes    Alcohol/week: 2.0 standard drinks of alcohol    Types: 2 Cans of beer per week    Comment: OCCASIONAL-wine, beer,mixed   Drug use: No    ROS: Constitutional:  Negative for fever, chills, weight loss CV: Negative for chest pain, previous MI, hypertension Respiratory:  Negative for shortness of breath, wheezing, sleep apnea, frequent cough GI:  Negative for nausea, vomiting, bloody stool, GERD  Physical exam: BP (!) 184/105   Pulse 73   Ht 5' 7 (1.702 m)   Wt 220 lb (99.8 kg)   BMI 34.46 kg/m  GENERAL APPEARANCE:  Well appearing, well developed, well nourished, NAD HEENT:  Atraumatic, normocephalic, oropharynx clear NECK:  Supple without lymphadenopathy or thyromegaly ABDOMEN:  Soft, non-tender, no masses EXTREMITIES:  Moves all extremities well, without clubbing, cyanosis, or edema NEUROLOGIC:  Alert and oriented x 3, normal gait, CN II-XII grossly intact MENTAL STATUS:  appropriate BACK:  Non-tender to palpation, No CVAT SKIN:  Warm, dry, and intact GU: Prostate: 40 g, NT, no nodules Rectum: Normal tone,  no masses or tenderness    Results: U/A: 0-5 WBCs, 0-2 RBCs

## 2024-09-14 ENCOUNTER — Encounter: Payer: Self-pay | Admitting: Radiology

## 2024-11-17 ENCOUNTER — Ambulatory Visit: Admitting: Urology
